# Patient Record
Sex: Male | Born: 1945 | Race: White | Hispanic: No | Smoking: Never smoker
Health system: Southern US, Community
[De-identification: ages and names within clinical notes are randomized; demographics above are authoritative.]

## PROBLEM LIST (undated history)

## (undated) DIAGNOSIS — N179 Acute kidney failure, unspecified: Secondary | ICD-10-CM

## (undated) DIAGNOSIS — G20 Parkinson's disease: Secondary | ICD-10-CM

## (undated) DIAGNOSIS — C61 Malignant neoplasm of prostate: Secondary | ICD-10-CM

## (undated) DIAGNOSIS — R1310 Dysphagia, unspecified: Secondary | ICD-10-CM

## (undated) DIAGNOSIS — G20A1 Parkinson's disease without dyskinesia, without mention of fluctuations: Secondary | ICD-10-CM

## (undated) DIAGNOSIS — C801 Malignant (primary) neoplasm, unspecified: Secondary | ICD-10-CM

## (undated) DIAGNOSIS — M1990 Unspecified osteoarthritis, unspecified site: Secondary | ICD-10-CM

## (undated) DIAGNOSIS — M6282 Rhabdomyolysis: Secondary | ICD-10-CM

## (undated) DIAGNOSIS — J45909 Unspecified asthma, uncomplicated: Secondary | ICD-10-CM

## (undated) HISTORY — DX: Malignant neoplasm of prostate: C61

## (undated) HISTORY — DX: Unspecified asthma, uncomplicated: J45.909

## (undated) HISTORY — DX: Parkinson's disease without dyskinesia, without mention of fluctuations: G20.A1

## (undated) HISTORY — PX: CARDIAC CATHETERIZATION: SHX172

## (undated) HISTORY — DX: Unspecified osteoarthritis, unspecified site: M19.90

## (undated) HISTORY — DX: Malignant (primary) neoplasm, unspecified: C80.1

## (undated) HISTORY — DX: Parkinson's disease: G20

---

## 2008-02-07 DIAGNOSIS — Z1211 Encounter for screening for malignant neoplasm of colon: Secondary | ICD-10-CM | POA: Insufficient documentation

## 2008-02-09 DIAGNOSIS — K200 Eosinophilic esophagitis: Secondary | ICD-10-CM | POA: Insufficient documentation

## 2010-04-25 DIAGNOSIS — R972 Elevated prostate specific antigen [PSA]: Secondary | ICD-10-CM | POA: Insufficient documentation

## 2010-06-09 HISTORY — PX: KNEE ARTHROSCOPY: SHX127

## 2021-06-02 DIAGNOSIS — M899 Disorder of bone, unspecified: Secondary | ICD-10-CM | POA: Insufficient documentation

## 2021-06-02 DIAGNOSIS — T68XXXA Hypothermia, initial encounter: Secondary | ICD-10-CM | POA: Insufficient documentation

## 2021-06-02 DIAGNOSIS — N179 Acute kidney failure, unspecified: Secondary | ICD-10-CM | POA: Insufficient documentation

## 2021-06-02 DIAGNOSIS — M6282 Rhabdomyolysis: Secondary | ICD-10-CM | POA: Insufficient documentation

## 2021-06-13 DIAGNOSIS — E872 Acidosis, unspecified: Secondary | ICD-10-CM | POA: Insufficient documentation

## 2021-06-13 DIAGNOSIS — M545 Low back pain, unspecified: Secondary | ICD-10-CM | POA: Insufficient documentation

## 2021-06-13 DIAGNOSIS — N400 Enlarged prostate without lower urinary tract symptoms: Secondary | ICD-10-CM | POA: Insufficient documentation

## 2021-06-13 DIAGNOSIS — C61 Malignant neoplasm of prostate: Secondary | ICD-10-CM | POA: Insufficient documentation

## 2021-06-13 DIAGNOSIS — L8995 Pressure ulcer of unspecified site, unstageable: Secondary | ICD-10-CM | POA: Insufficient documentation

## 2021-06-13 DIAGNOSIS — Z9181 History of falling: Secondary | ICD-10-CM | POA: Insufficient documentation

## 2021-06-13 DIAGNOSIS — I21A1 Myocardial infarction type 2: Secondary | ICD-10-CM | POA: Insufficient documentation

## 2021-06-13 DIAGNOSIS — R531 Weakness: Secondary | ICD-10-CM | POA: Insufficient documentation

## 2021-06-18 DIAGNOSIS — G893 Neoplasm related pain (acute) (chronic): Secondary | ICD-10-CM | POA: Insufficient documentation

## 2021-06-18 DIAGNOSIS — R634 Abnormal weight loss: Secondary | ICD-10-CM | POA: Insufficient documentation

## 2021-06-18 DIAGNOSIS — R339 Retention of urine, unspecified: Secondary | ICD-10-CM | POA: Insufficient documentation

## 2021-06-18 DIAGNOSIS — R198 Other specified symptoms and signs involving the digestive system and abdomen: Secondary | ICD-10-CM | POA: Insufficient documentation

## 2021-07-23 DIAGNOSIS — D649 Anemia, unspecified: Secondary | ICD-10-CM | POA: Insufficient documentation

## 2021-07-31 ENCOUNTER — Encounter: Payer: Self-pay | Admitting: Family Medicine

## 2021-07-31 ENCOUNTER — Telehealth: Payer: Self-pay

## 2021-07-31 ENCOUNTER — Ambulatory Visit (INDEPENDENT_AMBULATORY_CARE_PROVIDER_SITE_OTHER): Payer: Medicare Other | Admitting: Family Medicine

## 2021-07-31 ENCOUNTER — Other Ambulatory Visit: Payer: Self-pay

## 2021-07-31 VITALS — BP 101/64 | HR 87 | Temp 98.1°F | Resp 16 | Wt 112.0 lb

## 2021-07-31 DIAGNOSIS — J45909 Unspecified asthma, uncomplicated: Secondary | ICD-10-CM

## 2021-07-31 DIAGNOSIS — M1990 Unspecified osteoarthritis, unspecified site: Secondary | ICD-10-CM | POA: Diagnosis not present

## 2021-07-31 DIAGNOSIS — G20 Parkinson's disease: Secondary | ICD-10-CM | POA: Diagnosis not present

## 2021-07-31 DIAGNOSIS — C61 Malignant neoplasm of prostate: Secondary | ICD-10-CM | POA: Diagnosis not present

## 2021-07-31 DIAGNOSIS — T68XXXA Hypothermia, initial encounter: Secondary | ICD-10-CM

## 2021-07-31 DIAGNOSIS — L89892 Pressure ulcer of other site, stage 2: Secondary | ICD-10-CM

## 2021-07-31 DIAGNOSIS — Z111 Encounter for screening for respiratory tuberculosis: Secondary | ICD-10-CM

## 2021-07-31 DIAGNOSIS — G20A1 Parkinson's disease without dyskinesia, without mention of fluctuations: Secondary | ICD-10-CM

## 2021-07-31 DIAGNOSIS — E860 Dehydration: Secondary | ICD-10-CM

## 2021-07-31 NOTE — Progress Notes (Signed)
New patient visit  I,Philip [name],acting as a scribe for Philip Durie, MD.,have documented all relevant documentation on the behalf of Philip Durie, MD,as directed by  Philip Durie, MD while in the presence of Philip Durie, MD.   Patient: Philip [name]   DOB: 13-Jul-1945   75 y.o. Male  MRN: [id number] Visit Date: 07/31/2021  Today's healthcare provider: Wilhemena Durie, MD   Chief Complaint  Patient presents with   Establish Care   Subjective    Philip [name] is a 76 y.o. male who presents today as a new patient to establish care.  Pt brought in by brother and sister in law.  He saw his family doctor in [place] every 4 to 5 years.  No medical history other than right knee surgery 1981 left knee surgery in 1986 and left ACL reconstruction 2012.  Worked in the [place] area for the past 42 years and retired.  Has never been married and has no children he is a non-smoker he drinks alcohol about once a week.  He is on no medications and has no known drug allergies. 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  He is moved here now because this is his only family and he is moving into [employer]. He is in a wheelchair due to weakness and has an indwelling Foley catheter.  He was followed by Dr. Tobias Alexander and [place] he has no significant complaints today. HPI    Past Medical History:  Diagnosis Date   Arthritis    Asthma    Cancer ([place])    Parkinson disease ([place])    History reviewed. No pertinent surgical history. No family status information on file.   History reviewed. No pertinent family history. Social History   Socioeconomic History   Marital status: Widowed    Spouse name: Not on file   Number of children: Not on file   Years of education: Not on file   Highest education level:  Not on file  Occupational History   Not on file  Tobacco Use   Smoking status: Never   Smokeless tobacco: Never  Vaping Use   Vaping Use: Never used  Substance and Sexual Activity   Alcohol use: Yes   Drug use: Never   Sexual activity: Not on file  Other Topics Concern   Not on file  Social History Narrative   Not on file   Social Determinants of Health   Financial Resource Strain: Not on file  Food Insecurity: Not on file  Transportation Needs: Not on file  Physical Activity: Not on file  Stress: Not on file  Social Connections: Not on file   Outpatient Medications Prior to Visit  Medication Sig   Degarelix Acetate (FIRMAGON [place]) Inject into the skin.   No facility-administered medications prior to visit.   Not on File   There is no immunization history on file for this patient.  Health Maintenance  Topic Date Due   COVID-19 Vaccine (1) Never done   Hepatitis C Screening  Never done   TETANUS/TDAP  Never done   COLONOSCOPY (Pts 45-3yrs Insurance coverage will need to be confirmed)  Never done   Zoster Vaccines- Shingrix (1 of 2) Never done   Pneumonia Vaccine 64+ Years old (1 - PCV)  Never done   INFLUENZA VACCINE  Never done   HPV VACCINES  Aged Out    Patient Care Team: Jerrol Banana., MD as PCP - General (Family Medicine)  Review of Systems  All other systems reviewed and are negative.     Objective    BP 101/64 (BP Location: Right Arm, Patient Position: Sitting, Cuff Size: Normal)    Pulse 87    Temp 98.1 F (36.7 C) (Temporal)    Resp 16    Wt 112 lb (50.8 kg)    SpO2 97%  Physical Exam Vitals reviewed.  Constitutional:      Comments: Cachectic gentleman in no acute distress sitting in a wheelchair  HENT:     Head: Normocephalic and atraumatic.     Right Ear: External ear normal.     Left Ear: External ear normal.  Eyes:     Conjunctiva/sclera: Conjunctivae normal.  Cardiovascular:     Rate and Rhythm: Normal rate and regular rhythm.      Heart sounds: Normal heart sounds.  Pulmonary:     Breath sounds: Normal breath sounds.  Abdominal:     Palpations: Abdomen is soft.  Skin:    General: Skin is warm and dry.     Comments: All stage II decubitus that is healing nicely on the right lateral wrist and 2 small stage I decubiti on right lateral knee  Neurological:     Mental Status: He is alert.     Comments: Blank facies with right hand tremor.  Has cogwheeling right arm greater than left arm.  He has a shuffling gait. I did  Not have him walk very far.    Depression Screen PHQ 2/9 Scores 07/31/2021  PHQ - 2 Score 2  PHQ- 9 Score 5   No results found for any visits on 07/31/21.  Assessment & Plan      1. Prostate cancer ([place]) Metastatic to bone - Degarelix Acetate (FIRMAGON [place]); Inject into the skin. - CBC w/Diff/Platelet - Comprehensive Metabolic Panel (CMET) - TSH - Ambulatory referral to Urology - Ambulatory referral to Hematology / Oncology - Ambulatory referral to Physical Therapy  2. Parkinson disease [facility] For neurology. - Degarelix Acetate (FIRMAGON [place]); Inject into the skin. - CBC w/Diff/Platelet - Comprehensive Metabolic Panel (CMET) - TSH - Ambulatory referral to Neurology - Ambulatory referral to Physical Therapy  3. Arthritis  - Degarelix Acetate (FIRMAGON [place]); Inject into the skin. - CBC w/Diff/Platelet - Comprehensive Metabolic Panel (CMET) - TSH - Ambulatory referral to Physical Therapy  4. Uncomplicated asthma, unspecified asthma severity, unspecified whether persistent  - Degarelix Acetate (FIRMAGON [place]); Inject into the skin. - CBC w/Diff/Platelet - Comprehensive Metabolic Panel (CMET) - TSH - Ambulatory referral to Physical Therapy  5. Dehydration  - Degarelix Acetate (FIRMAGON [place]); Inject into the skin. - CBC w/Diff/Platelet - Comprehensive Metabolic Panel (CMET) - TSH  6. Hypothermia, initial encounter  - Degarelix Acetate (FIRMAGON [place]); Inject into the skin. -  CBC w/Diff/Platelet - Comprehensive Metabolic Panel (CMET) - TSH  7. Screening for tuberculosis  - QuantiFERON-TB Gold Plus 8.Decubiti  Return in about 2 months (around 09/28/2021).     I, Philip Durie, MD, have reviewed all documentation for this visit. The documentation on 08/04/21 for the exam, diagnosis, procedures, and orders are all accurate and complete.    Takeem Krotzer Cranford Mon, MD  [facility] [phone] (phone) [phone] (fax)  [place]

## 2021-07-31 NOTE — Telephone Encounter (Signed)
thx

## 2021-07-31 NOTE — Telephone Encounter (Signed)
Copied from [place] [id number]. Topic: General - Other >> Jul 30, 2021  5:09 PM Antonieta Iba C wrote: Reason for CRM: Philip [name] with Philip [name] assistant living is calling in to assist. She says that this person is a personal friend of Dr. Rosanna Randy and is being assisted by him. Philip [name] just moved here from [place]. Philip [name] says that provider agreed to assist Philip [name] with home health orders. Philip [name] would like a call back to discuss further.    [place]

## 2021-08-03 LAB — COMPREHENSIVE METABOLIC PANEL
ALT: 49 IU/L — ABNORMAL HIGH (ref 0–44)
AST: 29 IU/L (ref 0–40)
Albumin/Globulin Ratio: 2 (ref 1.2–2.2)
Albumin: 4.3 g/dL (ref 3.7–4.7)
Alkaline Phosphatase: 152 IU/L — ABNORMAL HIGH (ref 44–121)
BUN/Creatinine Ratio: 25 — ABNORMAL HIGH (ref 10–24)
BUN: 25 mg/dL (ref 8–27)
Bilirubin Total: 0.2 mg/dL (ref 0.0–1.2)
CO2: 25 mmol/L (ref 20–29)
Calcium: 9.1 mg/dL (ref 8.6–10.2)
Chloride: 101 mmol/L (ref 96–106)
Creatinine, Ser: 1.01 mg/dL (ref 0.76–1.27)
Globulin, Total: 2.2 g/dL (ref 1.5–4.5)
Glucose: 114 mg/dL — ABNORMAL HIGH (ref 70–99)
Potassium: 4.4 mmol/L (ref 3.5–5.2)
Sodium: 140 mmol/L (ref 134–144)
Total Protein: 6.5 g/dL (ref 6.0–8.5)
eGFR: 79 mL/min/{1.73_m2} (ref >59)

## 2021-08-03 LAB — CBC WITH DIFFERENTIAL/PLATELET
Basophils Absolute: 0.1 10*3/uL (ref 0.0–0.2)
Basos: 1 %
EOS (ABSOLUTE): 0.4 10*3/uL (ref 0.0–0.4)
Eos: 4 %
Hematocrit: 37.2 % — ABNORMAL LOW (ref 37.5–51.0)
Hemoglobin: 12.1 g/dL — ABNORMAL LOW (ref 13.0–17.7)
Immature Grans (Abs): 0 10*3/uL (ref 0.0–0.1)
Immature Granulocytes: 0 %
Lymphocytes Absolute: 1.5 10*3/uL (ref 0.7–3.1)
Lymphs: 18 %
MCH: 29 pg (ref 26.6–33.0)
MCHC: 32.5 g/dL (ref 31.5–35.7)
MCV: 89 fL (ref 79–97)
Monocytes Absolute: 0.8 10*3/uL (ref 0.1–0.9)
Monocytes: 9 %
Neutrophils Absolute: 5.8 10*3/uL (ref 1.4–7.0)
Neutrophils: 68 %
Platelets: 347 10*3/uL (ref 150–450)
RBC: 4.17 x10E6/uL (ref 4.14–5.80)
RDW: 14 % (ref 11.6–15.4)
WBC: 8.5 10*3/uL (ref 3.4–10.8)

## 2021-08-03 LAB — TSH: TSH: 1.34 u[IU]/mL (ref 0.450–4.500)

## 2021-08-03 LAB — QUANTIFERON-TB GOLD PLUS
QuantiFERON Mitogen Value: 0.27 IU/mL
QuantiFERON Nil Value: 0 IU/mL
QuantiFERON TB1 Ag Value: 0 IU/mL
QuantiFERON TB2 Ag Value: 0 IU/mL
QuantiFERON-TB Gold Plus: UNDETERMINED — AB

## 2021-08-06 NOTE — Progress Notes (Signed)
? ?08/07/21 ?4:02 PM  ? ?Philip [name] ?[birth year] ?[id number] ? ?Referring provider:  ?Philip [name]., MD ?[facility] ?[address] ?[place],  [place] [address] ?Chief Complaint  ?Patient presents with  ? Prostate Cancer  ? ? ? ?HPI: ?Philip [name] is a 76 y.o.male who presents today for further evaluation of prostate cancer and to establish care.  ? ?He was seen by his PCP, Dr. Rosanna [name], on 07/31/2021 to establish care. He was followed by a family doctor in [place] every 4 to 5 years. On 06/02/2021 he fell and was no found for a couple of days and was found to be dehydrated and hypothermic his work-up incidentally found him to have PSA of 962 and mri showed extensive sclerosis in pelvic, sacral, spine, and scapula, prostate cancer metastatic to bone. Creatinine was 2.94 without normalization. He had severe sepsis and bacteruria. He is in a wheelchair due to weakness and has an indwelling Foley catheter. He is living at [facility] to be closer to his brother  ? ?His most recent PSA on 07/31/2020 was 62.2.  ? ?He is accompanied by his brother today. He is able to walk with a walker. He has sensation in his bladder. He is unable to swallow pills. ? ?He has an appointment next week to establish care with Dr. Rogue [name]. ? ?He does believe that the last time he got a Depo injection, was for a longer period of time, likely transition to Eligard/Lupron.  Records from his oncologist were requested today. ? ?He continues to have a indwelling Foley catheter. ? ?He remains in a wheelchair and is quite weak.  In addition to the above, he also has a personal history of Parkinson's disease. ? ?PMH: ?Past Medical History:  ?Diagnosis Date  ? Arthritis   ? Asthma   ? Cancer [facility])   ? Parkinson disease ([place])   ? ? ?Surgical History: ?No past surgical history on file. ? ?Home Medications:  ?Allergies as of 08/07/2021   ?No Known Allergies ?  ? ?  ?Medication List  ?  ? ?  ? Accurate as of August 07, 2021  4:02 PM. If you have  any questions, ask your nurse or doctor.  ?  ?  ? ?  ? ?FIRMAGON [place] ?Inject into the skin. ?  ? ?  ? ? ?Allergies: No Known Allergies ? ?Family History: ?No family history on file. ? ?Social History:  reports that he has never smoked. He has never used smokeless tobacco. He reports current alcohol use. He reports that he does not use drugs. ? ? ?Physical Exam: ?BP 100/66   Pulse 87   ?Constitutional:  Alert and oriented, No acute distress. ?HEENT: [place] AT, moist mucus membranes.  Trachea midline, no masses. ?Cardiovascular: No clubbing, cyanosis, or edema. ?Respiratory: Normal respiratory effort, no increased work of breathing. ?Skin: No rashes, bruises or suspicious lesions. ?Neurologic: Grossly intact, no focal deficits, moving all 4 extremities. ?Psychiatric: Normal mood and affect. ? ?Laboratory Data: ?Lab Results  ?Component Value Date  ? CREATININE 1.01 07/31/2021  ? ?Pertinent imaging: ?Results for orders placed or performed in visit on 08/07/21  ?BLADDER SCAN AMB NON-IMAGING  ?Result Value Ref Range  ? Scan Result 76 ml   ? ? ? ?Assessment & Plan:   ?Metastatic prostate cancer ?- Records release from oncologist  ?- Defer treatment to Dr. Rogue [name]  in Oncology  ?- He was transitioned to Leuprorelin would likely benefit from addition from oral agent he has difficulty  swallowing.  ?-Records are requested today regarding timing of most recent leuprolide injection  ? ?2.  Urinary retention ?-Likely secondary to obstruction from prostate cancer as well as overall medical deconditioning ?-Given the fairly dramatic improvement in his PSA, he may benefit from a voiding trial, discussed risk and benefits of whether to proceed today.  Is very anxious about proceeding. ?- Indwelling catheter removed today, strict cautions to return later today and is having any difficulty voiding with low threshold to replace the catheter. ?- Will come back next week for a PVR. He agreed to voiding trial. We also discussed alpha  blockers  ? ? ?I,Philip [name],acting as a scribe for Philip Espy, MD.,have documented all relevant documentation on the behalf of Philip Espy, MD,as directed by  Philip Espy, MD while in the presence of Philip Espy, MD. ? ?I have reviewed the above documentation for accuracy and completeness, and I agree with the above.  ? ?Philip Espy, MD ? ? ?[facility] ?[address], [address] ?[place], [place] [address] ?[phone] ? ?

## 2021-08-07 ENCOUNTER — Other Ambulatory Visit: Payer: Self-pay

## 2021-08-07 ENCOUNTER — Ambulatory Visit: Payer: Medicare Other | Admitting: Urology

## 2021-08-07 ENCOUNTER — Ambulatory Visit (INDEPENDENT_AMBULATORY_CARE_PROVIDER_SITE_OTHER): Payer: Medicare Other | Admitting: Urology

## 2021-08-07 ENCOUNTER — Encounter: Payer: Self-pay | Admitting: Urology

## 2021-08-07 VITALS — BP 100/66 | HR 87

## 2021-08-07 DIAGNOSIS — R339 Retention of urine, unspecified: Secondary | ICD-10-CM | POA: Diagnosis not present

## 2021-08-07 DIAGNOSIS — C61 Malignant neoplasm of prostate: Secondary | ICD-10-CM

## 2021-08-07 DIAGNOSIS — C7951 Secondary malignant neoplasm of bone: Secondary | ICD-10-CM | POA: Diagnosis not present

## 2021-08-07 LAB — BLADDER SCAN AMB NON-IMAGING: Scan Result: 76

## 2021-08-07 MED ORDER — CEPHALEXIN 250 MG PO CAPS
500.0000 mg | ORAL_CAPSULE | Freq: Once | ORAL | Status: AC
  Administered 2021-08-07: 500 mg via ORAL

## 2021-08-07 MED ORDER — CEPHALEXIN 250 MG PO CAPS: 500.0000 mg | ORAL_CAPSULE | Freq: Every day | ORAL | Status: DC

## 2021-08-07 NOTE — Progress Notes (Signed)
Fill and Pull Catheter Removal ? ?Patient is present today for a catheter removal.  Patient was cleaned and prepped in a sterile fashion 219ml of sterile water/ saline was instilled into the bladder when the patient felt the urge to urinate. 68ml of water was then drained from the balloon.  A 16FR foley cath was removed from the bladder no complications were noted .  Patient as then given some time to void on their own.  Patient can void  26ml on their own after some time.  Patient tolerated well. ? ?Performed by: Verlene Mayer, CMA ? ?Follow up/ Additional notes: Return this afternoon if unable to void-reviewed precautions. Otherwise keep follow up as scheduled next week ? ?

## 2021-08-08 ENCOUNTER — Ambulatory Visit (INDEPENDENT_AMBULATORY_CARE_PROVIDER_SITE_OTHER): Payer: Medicare Other | Admitting: Urology

## 2021-08-08 DIAGNOSIS — R339 Retention of urine, unspecified: Secondary | ICD-10-CM | POA: Diagnosis not present

## 2021-08-08 LAB — BLADDER SCAN AMB NON-IMAGING: Scan Result: 350

## 2021-08-08 NOTE — Progress Notes (Signed)
Simple Catheter Placement ? ?Due to urinary retention patient is present today for a foley cath placement.  Patient was cleaned and prepped in a sterile fashion with betadine. A 16 FR  coude foley catheter was inserted, urine return was noted  375 ml, urine was yellow in color.  The balloon was filled with 10cc of sterile water.  A night bag was attached for drainage.   Patient was given instruction on proper catheter care.  Patient tolerated well, no complications were noted  ? ?Performed by: Kerman Passey, RMA ? ?Additional notes/ Follow up: 1 month voiding trial   ?

## 2021-08-08 NOTE — Patient Instructions (Signed)

## 2021-08-08 NOTE — Progress Notes (Signed)
? ?  08/09/21 ?7:58 AM  ? ?Philip [name] [name] ?[birth year] ?[id number] ? ?Referring provider:  ?Philip [name] [name]., MD ?[facility] ?[address] ?[place],  [place] [address] ?Chief Complaint  ?Patient presents with  ? Urinary Retention  ? ? ? ?HPI: ?Philip [name] [name] is a 76 y.o.male with a personal history of metastatic prostate cancer who presents today for urinary retention. ? ?Please see note from yesterday.  He underwent a voiding trial and was able to void some with very little residual yesterday afternoon.  Unfortunately, overnight, he was unable to urinate and was fairly uncomfortable.  He did dribble in his diaper. ? ?Bladder scan here in the office indicated about 350 cc. ? ? ?PMH: ?Past Medical History:  ?Diagnosis Date  ? Arthritis   ? Asthma   ? Cancer [facility])   ? Parkinson disease ([place])   ? ? ?Surgical History: ?No past surgical history on file. ? ?Home Medications:  ?Allergies as of 08/08/2021   ?No Known Allergies ?  ? ?  ?Medication List  ?  ? ?  ? Accurate as of August 08, 2021 11:59 PM. If you have any questions, ask your nurse or doctor.  ?  ?  ? ?  ? ?FIRMAGON [place] ?Inject into the skin. ?  ? ?  ? ? ?Allergies: No Known Allergies ? ?Family History: ?No family history on file. ? ?Social History:  reports that he has never smoked. He has never used smokeless tobacco. He reports current alcohol use. He reports that he does not use drugs. ? ? ?Physical Exam: ?Accompanied by brother today ?Constitutional:  Alert and oriented, No acute distress. ?HEENT: [place] AT, moist mucus membranes.  Trachea midline, no masses. ?Cardiovascular: No clubbing, cyanosis, or edema. ?Respiratory: Normal respiratory effort, no increased work of breathing. ?GU: Foley catheter replaced draining clear yellow urine, approximately 350 cc.  Lower abdomen nondistended. ?Skin: No rashes, bruises or suspicious lesions. ?Neurologic: Grossly intact, no focal deficits, moving all 4 extremities. ?Psychiatric: Normal mood and affect. ? ?Laboratory Data: ?Lab  Results  ?Component Value Date  ? CREATININE 1.01 07/31/2021  ? ? ?Pertinent Imaging: ?Results for orders placed or performed in visit on 08/08/21  ?BLADDER SCAN AMB NON-IMAGING  ?Result Value Ref Range  ? Scan Result 350 ml   ? ? ? ? ?Assessment & Plan:   ? ?Urinary retention ?-Failed voiding trial ?- His catheter was replaced today   ?-Continue to treat prostate cancer, reattempted voiding trial again in about a month ?-We did go ahead today and briefly have discussion about management options if he continues to remain in urinary retention including suprapubic tube, self cath, continue penile Foley catheter.  He is tremulous from his prostate cancer and CIC may not be an option.  He is somewhat interested in SP tube.  We will approach this at his next follow-up pending his success or failure with repeat void trial. ? ?2.  Metastatic prostate cancer ?-See note from yesterday, establishing care with medical oncology next week ? ?Return in 4 weeks for catheter removal  ? ?Conley Rolls as a scribe for [name], MD.,have documented all relevant documentation on the behalf of [name], MD,as directed by  [name], MD while in the presence of [name], MD. ? ?I have reviewed the above documentation for accuracy and completeness, and I agree with the above.  ? ?[name], MD ? ?[facility] ?[address], [address] ?[place], [place] [address] ?[phone] ? ?

## 2021-08-14 ENCOUNTER — Other Ambulatory Visit: Payer: Self-pay

## 2021-08-14 ENCOUNTER — Inpatient Hospital Stay: Payer: Medicare Other

## 2021-08-14 ENCOUNTER — Inpatient Hospital Stay: Payer: Medicare Other | Attending: Internal Medicine | Admitting: Internal Medicine

## 2021-08-14 ENCOUNTER — Ambulatory Visit: Payer: Medicare Other | Admitting: Urology

## 2021-08-14 ENCOUNTER — Telehealth: Payer: Self-pay

## 2021-08-14 ENCOUNTER — Encounter: Payer: Self-pay | Admitting: Internal Medicine

## 2021-08-14 DIAGNOSIS — M129 Arthropathy, unspecified: Secondary | ICD-10-CM | POA: Diagnosis not present

## 2021-08-14 DIAGNOSIS — C61 Malignant neoplasm of prostate: Secondary | ICD-10-CM | POA: Diagnosis present

## 2021-08-14 DIAGNOSIS — C7951 Secondary malignant neoplasm of bone: Secondary | ICD-10-CM | POA: Insufficient documentation

## 2021-08-14 DIAGNOSIS — G20 Parkinson's disease: Secondary | ICD-10-CM | POA: Diagnosis not present

## 2021-08-14 DIAGNOSIS — J45909 Unspecified asthma, uncomplicated: Secondary | ICD-10-CM | POA: Insufficient documentation

## 2021-08-14 DIAGNOSIS — R630 Anorexia: Secondary | ICD-10-CM | POA: Insufficient documentation

## 2021-08-14 NOTE — Telephone Encounter (Signed)
Called no answer no voicemail

## 2021-08-14 NOTE — Assessment & Plan Note (Addendum)
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 ?# I reviewed the natural history/stage of patient's prostate cancer in detail.  Discussed that unfortunately patient's cancer cannot be cured.  All treatments are palliative and not curative.  Discussed that patient will progress to castrate resistant prostate cancer on ADT alone 1 to 2 years.  ? ?I reviewed the new data from STAMPEDE/CHAARTED clinical trials-in patients with high-risk /castrate sensitive metastatic prostate cancer; that have shown the benefit of chemotherapy [Taxotere ] with approximately 1-2 years of PFS/also overall survival benefit. every 3 weeks ?6 cycles is recommended. I also reviewed the data of from LATITUDE study, given the use of abiraterone pills plus prednisone-showed to improve survival benefit [median survival-53 versus 36 months].  However patient has difficulty swallowing pills [see below].  Patient's performance status is poor at this time-I would recommend holding off chemotherapy. ? ?#Indwelling Foley catheter-s/p evaluation with Dr. Erlene Quan.  Failed trial of voiding [march 2023]- ?  Suprapubic catheter.  Awaiting appointment again in April ? ?# Dysphagia- [> 30 years-] ?  Esophageal spasms recommend GI evaluation- refer to GI  ? ?#Parkinson's-?-Await evaluation with neurology. ? ?#Again reviewed with the brother that-any additional therapies to ADT will depend upon patient's improvement of performance status-[above work-up with GI; neurology; urology] ? ?Thank you Dr. Rosanna Randy for allowing me to participate in the care of your pleasant patient. Please do not hesitate to contact me with questions or concerns in the interim.  The above plan of care was discussed with the patient and his brother in detail.  They are in agreement. ? ?# DISPOSITION: ?# no labs today ?# refer to Dr.Anna re:  Dysphagia/? Esophagus spasms ?# follow up in mid MAY 2023- MD; labs-  cbc/cmp;PSA- eligard- Dr.B ? ?

## 2021-08-14 NOTE — Progress Notes (Signed)
[place] OFFICE PROGRESS NOTE  Patient Care Team: Jerrol Banana., MD as PCP - General [facility])   Cancer Staging  No matching staging information was found for the patient.   Oncology History Overview Note  # DEC 2022 [X-mas- found s/p fall; [place]; PSA 962; s/p Bx of [name][Dr.[name]]  #    Prostate cancer metastatic to bone ([place])  08/14/2021 Initial Diagnosis   Prostate cancer metastatic to bone ([place])      HISTORY OF PRESENT ILLNESS: Patient in a wheelchair.  Patient lives in assisted living; accompanied by his brother.  Philip [name] 76 y.o.  male pleasant patient above history of metastatic prostate cancer to the bone has been referred to [place] by  PCP to discuss further evaluation/treatment/to establish care.   On Christmas Day 2022 he fell and was no found for a couple of days and was found to be dehydrated and hypothermic.  Patient was admitted to the ICU in [place].  During hospitalization patient was found to have elevated PSA -  962 and mri showed extensive sclerosis in pelvic, sacral, spine, and scapula, prostate cancer metastatic to bone. Creatinine was 2.94 without normalization. He had severe sepsis and bacteruria.   At discharge patient was sent over to rehab.  Given the family in [place]/brother-patient moved to [place].  Prior to this episode patient was losing weight.  However patient had been very healthy prior to this episode.  He was living alone.  Patient has chronic difficulty swallowing since age of 35.?  Esophagus spasms.   He remains in a wheelchair and is quite weak.  Possible diagnosis of Parkinson's disease; awaiting neurology evaluation.  With regards to prostate cancer patient received Firmagon x1; followed by Eligard on 2/14.  Patient continues to have indwelling Foley catheter   Review of Systems  Constitutional:  Positive for malaise/fatigue and weight loss. Negative for chills, diaphoresis and  fever.  HENT:  Negative for nosebleeds and sore throat.   Eyes:  Negative for double vision.  Respiratory:  Negative for cough, hemoptysis, sputum production, shortness of breath and wheezing.   Cardiovascular:  Negative for chest pain, palpitations, orthopnea and leg swelling.  Gastrointestinal:  Negative for abdominal pain, blood in stool, constipation, diarrhea, heartburn, melena, nausea and vomiting.  Genitourinary:  Negative for dysuria, frequency and urgency.  Musculoskeletal:  Negative for back pain and joint pain.  Skin: Negative.  Negative for itching and rash.  Neurological:  Positive for weakness. Negative for dizziness, tingling, focal weakness and headaches.  Endo/Heme/Allergies:  Does not bruise/bleed easily.  Psychiatric/Behavioral:  Negative for depression. The patient is not nervous/anxious and does not have insomnia.      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  MEDICATIONS:  Current Outpatient Medications  Medication Sig Dispense Refill   Degarelix Acetate (FIRMAGON [place]) Inject into the skin.     No current facility-administered medications for this visit.    PHYSICAL EXAMINATION: ECOG PERFORMANCE STATUS: 2 - Symptomatic, <50% confined to bed  BP 99/63 (BP Location: Left Arm, Patient Position: Sitting, Cuff Size:  Normal)    Pulse 77    Temp 98.6 F (37 C) (Tympanic)    Ht '5\' 8"'$  (1.727 m)    Wt 114 lb 12.8 oz (52.1 kg)    SpO2 99%    BMI 17.46 kg/m   Filed Weights   08/14/21 1402  Weight: 114 lb 12.8 oz (52.1 kg)    Physical Exam Vitals and nursing note reviewed.  HENT:     Head: Normocephalic and atraumatic.     Mouth/Throat:     Pharynx: Oropharynx is clear.  Eyes:     Extraocular Movements: Extraocular movements intact.     Pupils: Pupils are equal, round, and reactive to light.  Cardiovascular:     Rate and Rhythm: Normal rate and regular rhythm.  Pulmonary:     Comments: Decreased breath sounds bilaterally.  Abdominal:     Palpations: Abdomen is soft.  Musculoskeletal:        General: Normal range of motion.     Cervical back: Normal range of motion.  Skin:    General: Skin is warm.  Neurological:     General: No focal deficit present.     Mental Status: He is alert and oriented to person, place, and time.  Psychiatric:        Behavior: Behavior normal.        Judgment: Judgment normal.    LABORATORY DATA:  I have reviewed the data as listed    Component Value Date/Time   NA 140 07/31/2021 1612   K 4.4 07/31/2021 1612   CL 101 07/31/2021 1612   CO2 25 07/31/2021 1612   GLUCOSE 114 (H) 07/31/2021 1612   BUN 25 07/31/2021 1612   CREATININE 1.01 07/31/2021 1612   CALCIUM 9.1 07/31/2021 1612   PROT 6.5 07/31/2021 1612   ALBUMIN 4.3 07/31/2021 1612   AST 29 07/31/2021 1612   ALT 49 (H) 07/31/2021 1612   ALKPHOS 152 (H) 07/31/2021 1612   BILITOT 0.2 07/31/2021 1612    No results found for: SPEP, UPEP  Lab Results  Component Value Date   WBC 8.5 07/31/2021   NEUTROABS 5.8 07/31/2021   HGB 12.1 (L) 07/31/2021   HCT 37.2 (L) 07/31/2021   MCV 89 07/31/2021   PLT 347 07/31/2021      Chemistry      Component Value Date/Time   NA 140 07/31/2021 1612   K 4.4 07/31/2021 1612   CL 101 07/31/2021 1612   CO2 25 07/31/2021 1612   BUN 25 07/31/2021 1612    CREATININE 1.01 07/31/2021 1612      Component Value Date/Time   CALCIUM 9.1 07/31/2021 1612   ALKPHOS 152 (H) 07/31/2021 1612   AST 29 07/31/2021 1612   ALT 49 (H) 07/31/2021 1612   BILITOT 0.2 07/31/2021 1612       RADIOGRAPHIC STUDIES: I have personally reviewed the radiological images as listed and agreed with the findings in the report. No results found.   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  Discussed that unfortunately patient's cancer cannot be cured.  All treatments are palliative and not curative.  Discussed that patient will progress to castrate resistant prostate cancer on ADT alone 1 to 2 years.   I reviewed the new data from STAMPEDE/CHAARTED clinical trials-in patients with high-risk /castrate sensitive metastatic prostate cancer; that have shown the benefit of chemotherapy [Taxotere ] with approximately 1-2 years of PFS/also overall survival benefit. every 3 weeks 6 cycles is recommended. I also reviewed the data of from LATITUDE study, given the use of abiraterone pills plus prednisone-showed to improve survival benefit [median survival-53 versus 36 months].  However patient has difficulty swallowing pills [see below].  Patient's performance status is poor at this time-I would recommend holding off chemotherapy.  #Indwelling Foley catheter-s/p evaluation with Dr. Erlene Quan.  Failed trial of voiding [march 2023]- ?  Suprapubic catheter.  Awaiting appointment again in April  # Dysphagia- [> 30 years-] ?  Esophageal spasms recommend GI evaluation- refer to GI   #Parkinson's-?-Await evaluation with neurology.  #Again reviewed with the brother that-any additional therapies to ADT will depend upon patient's improvement of performance status-[above  work-up with GI; neurology; urology]  Thank you Dr. Rosanna Randy for allowing me to participate in the care of your pleasant patient. Please do not hesitate to contact me with questions or concerns in the interim.  The above plan of care was discussed with the patient and his brother in detail.  They are in agreement.  # DISPOSITION: # no labs today # refer to Dr.Anna re:  Dysphagia/? Esophagus spasms # follow up in mid MAY 2023- MD; labs- cbc/cmp;PSA- eligard- Dr.B    Orders Placed This Encounter  Procedures   CBC with Differential/Platelet    Standing Status:   Future    Standing Expiration Date:   08/15/2022   Comprehensive metabolic panel    Standing Status:   Future    Standing Expiration Date:   08/15/2022   PSA    Standing Status:   Future    Standing Expiration Date:   08/15/2022   Ambulatory referral to Gastroenterology    Referral Priority:   Routine    Referral Type:   Consultation    Referral Reason:   Specialty Services Required    Referred to Provider:   Jonathon Bellows, MD    Number of Visits Requested:   1   All questions were answered. The patient knows to call the clinic with any problems, questions or concerns.      [name], MD 08/14/2021 4:24 PM

## 2021-08-15 ENCOUNTER — Telehealth: Payer: Self-pay

## 2021-08-15 NOTE — Telephone Encounter (Signed)
Scheduled for 11/18/2021 ?

## 2021-09-10 NOTE — Progress Notes (Signed)
? ?09/11/21 ?11:43 AM  ? ?Philip [name] ?08-21-1945 ?[id number] ? ?Referring provider:  ?Jerrol Banana., MD ?[place] ?[address] ?[place],  [place] [address] ?No chief complaint on file. ? ? ? ?HPI: ?Philip [name] is a 76 y.o.male with a personal history of metastatic prostate cancer and urinary retention who presents today for a 1 month voiding trial.  ? ?In 07/2021 of this year he had a fall and was not found for a couple of days. He had extensive work-up and was found to have a PSA of 962, MRI showed extensive sclerosis in pelvic, sacral, spine, scapula, and prostate cancer metastatic to bone. His course was complicated by severe sepsis and bacteruria.  ? ?From a cancer perspective, he is being followed by Dr. Rogue Bussing.  He is on ADT.  His PSA is trending down, now 62.2. ? ?He underwent a voiding trial on 08/07/2021 he was unable to void and had some very little residual he returned on 08/08/2021 bladder scan was 350 cc his catheter was replaced.  ? ?He has been working with physical therapy and now ambulatory with a walker.  He feels stronger than previous. ? ?PMH: ?Past Medical History:  ?Diagnosis Date  ? Arthritis   ? Asthma   ? Cancer [facility])   ? Parkinson disease ([place])   ? Prostate cancer ([place])   ? ? ?Surgical History: ?Past Surgical History:  ?Procedure Laterality Date  ? KNEE ARTHROSCOPY Right 2012  ? also lt knee unsure of date  ? ? ?Home Medications:  ?Allergies as of 09/11/2021   ?No Known Allergies ?  ? ?  ?Medication List  ?  ? ?  ? Accurate as of September 11, 2021 11:59 PM. If you have any questions, ask your nurse or doctor.  ?  ?  ? ?  ? ?FIRMAGON [place] ?Inject into the skin. ?  ? ?  ? ? ?Allergies: No Known Allergies ? ?Family History: ?Family History  ?Problem Relation Age of Onset  ? Arthritis/Rheumatoid Mother   ? Stroke Father   ? Hemangiomas Brother   ? ? ?Social History:  reports that he has never smoked. He has never used smokeless tobacco. He reports current alcohol use. He reports that he  does not use drugs. ? ? ?Physical Exam: ?BP 130/76   Pulse 76   Ht '5\' 8"'$  (1.727 m)   Wt 114 lb (51.7 kg)   BMI 17.33 kg/m?   ?Constitutional:  Alert and oriented, No acute distress.  Accompanied by his brother today. ?HEENT: [place] AT, moist mucus membranes.  Trachea midline, no masses. ?Cardiovascular: No clubbing, cyanosis, or edema. ?Respiratory: Normal respiratory effort, no increased work of breathing. ?Skin: No rashes, bruises or suspicious lesions. ?Neurologic: Grossly intact, no focal deficits, moving all 4 extremities. ?Psychiatric: Normal mood and affect. ? ?Laboratory Data: ?Lab Results  ?Component Value Date  ? CREATININE 1.01 07/31/2021  ? ? ?Assessment & Plan:   ? ?Urinary retention  ?- He underwent voiding trial today that was successful  ?- Catheter was removed today with strict cautions to return later today and is having any difficulty voiding with low threshold to replace the catheter.  ?- Discussed with him that he should stay hydrated throughout the day so that he can urinate.  ?- He will return in 1 week for PVR and IPSS ?-If he ultimately fails his voiding trial, we will have him return next week to discuss alternative options including SP tube versus consideration of outlet procedure.  May consider cystoscopy at that time as well. ? ?2. Metastatic prostate cancer  ?- Treated by Dr Rogue Bussing in Oncology  ?- Managed on ADT alone  ? ? ? Return in 1 week for IPSS and PVR  ? ?Conley Rolls as a Education administrator for [name], MD.,have documented all relevant documentation on the behalf of [name], MD,as directed by  [name], MD while in the presence of [name], MD. ? ?I have reviewed the above documentation for accuracy and completeness, and I agree with the above.  ? ?[name], MD ? ? ?[facility] ?[address] ?[place], [place] [address] ?[phone]  ?

## 2021-09-11 ENCOUNTER — Ambulatory Visit (INDEPENDENT_AMBULATORY_CARE_PROVIDER_SITE_OTHER): Payer: Medicare Other | Admitting: Urology

## 2021-09-11 ENCOUNTER — Encounter: Payer: Self-pay | Admitting: Urology

## 2021-09-11 VITALS — BP 130/76 | HR 76 | Ht 68.0 in | Wt 114.0 lb

## 2021-09-11 DIAGNOSIS — R339 Retention of urine, unspecified: Secondary | ICD-10-CM | POA: Diagnosis not present

## 2021-09-11 DIAGNOSIS — C61 Malignant neoplasm of prostate: Secondary | ICD-10-CM | POA: Diagnosis not present

## 2021-09-11 DIAGNOSIS — C7951 Secondary malignant neoplasm of bone: Secondary | ICD-10-CM | POA: Diagnosis not present

## 2021-09-11 NOTE — Patient Instructions (Signed)
Call the office if unable to void after 6hours ? ? ?

## 2021-09-11 NOTE — Progress Notes (Signed)
Fill and Pull Catheter Removal ? ?Patient is present today for a catheter removal.  Patient was cleaned and prepped in a sterile fashion 126m of sterile water/ saline was instilled into the bladder when the patient felt the urge to urinate. 962mof water was then drained from the balloon.  A 16FR foley cath was removed from the bladder no complications were noted .  Patient as then given some time to void on their own.  Patient can void  11021mn their own after some time.  Patient tolerated well. ? ?Performed by: RebVerlene MayerMA ? ?Follow up/ Additional notes: Next week as scheduled, patient aware to return to clinic if unable to void after 6hrs.  ? ?

## 2021-09-12 ENCOUNTER — Encounter: Payer: Self-pay | Admitting: Urology

## 2021-09-12 ENCOUNTER — Ambulatory Visit (INDEPENDENT_AMBULATORY_CARE_PROVIDER_SITE_OTHER): Payer: Medicare Other | Admitting: Urology

## 2021-09-12 DIAGNOSIS — R339 Retention of urine, unspecified: Secondary | ICD-10-CM | POA: Diagnosis not present

## 2021-09-12 LAB — URINALYSIS, COMPLETE
Bilirubin, UA: NEGATIVE
Glucose, UA: NEGATIVE
Ketones, UA: NEGATIVE
Leukocytes,UA: NEGATIVE
Nitrite, UA: NEGATIVE
Protein,UA: NEGATIVE
Specific Gravity, UA: 1.02 (ref 1.005–1.030)
Urobilinogen, Ur: 0.2 mg/dL (ref 0.2–1.0)
pH, UA: 6 (ref 5.0–7.5)

## 2021-09-12 LAB — MICROSCOPIC EXAMINATION

## 2021-09-12 LAB — BLADDER SCAN AMB NON-IMAGING

## 2021-09-12 NOTE — Progress Notes (Signed)
09/12/21 ?4:58 PM  ? ?Philip [name] ?[birth year] ?[id number] ? ?Referring provider:  ?Jerrol Banana., MD ?[place] ?[address] ?[place],  [place] [address] ? ?Chief Complaint  ?Patient presents with  ? Urinary Retention  ? ?Urological history  ?Urinary retention  ?-Likely due to obstruction from prostate cancer as well as overall medical deconditioning ?- Voiding trial on 08/07/2021 he was unable to void and had some very little residual he returned on 08/08/2021 bladder scan was 350 cc his catheter was replaced ?- Returned for voiding trial with Dr Erlene Quan on 09/11/2021 had a successful fill and pull ?- PVR 344 mL.  ? ?2. Metastatic prostate cancer  ?- He had extensive work-up and was found to have a PSA of 962, MRI showed extensive sclerosis in pelvic, sacral, spine, scapula, and prostate cancer metastatic to bone. ?- Treated by Dr Rogue Bussing in Oncology  ?- Managed on ADT alone -pending neurological evaluation on April 27, additional treatment for his prostate cancer may be considered ? ?HPI: ?Philip [name] is a 76 y.o.male who presents today for further evaluation of urinary retention. He is accompanied by his brother, Jeneen Rinks.   ? ?He had a successful fill and pull yesterday, but around 11:00 last evening he started feeling bladder pain with an inability to urinate. ? ?Patient denies any modifying or aggravating factors.  Patient denies any gross hematuria, dysuria or flank pain.  Patient denies any fevers, chills, nausea or vomiting.   ? ?CATH UA 11-30 RBC's and few bacteria.  ? ?Catheter placed for return to 400 mL of urine. ? ?PMH: ?Past Medical History:  ?Diagnosis Date  ? Arthritis   ? Asthma   ? Cancer [facility])   ? Parkinson disease ([place])   ? Prostate cancer ([place])   ? ? ?Surgical History: ?Past Surgical History:  ?Procedure Laterality Date  ? KNEE ARTHROSCOPY Right 2012  ? also lt knee unsure of date  ? ? ?Home Medications:  ?Allergies as of 09/12/2021   ?No Known Allergies ?  ? ?  ?Medication List  ?  ? ?   ? Accurate as of September 12, 2021  4:58 PM. If you have any questions, ask your nurse or doctor.  ?  ?  ? ?  ? ?FIRMAGON [place] ?Inject into the skin. ?  ? ?  ? ? ?Allergies:  ?No Known Allergies ? ?Family History: ?Family History  ?Problem Relation Age of Onset  ? Arthritis/Rheumatoid Mother   ? Stroke Father   ? Hemangiomas Brother   ? ? ?Social History:  reports that he has never smoked. He has never used smokeless tobacco. He reports current alcohol use. He reports that he does not use drugs. ? ? ?Physical Exam: ?Constitutional:  Alert and oriented, No acute distress.  Elderly and frail-appearing ?HEENT: [place] AT, moist mucus membranes.  Trachea midline ?Cardiovascular: No clubbing, cyanosis, or edema. ?Respiratory: Normal respiratory effort, no increased work of breathing. ?Neurologic: Grossly intact, no focal deficits, moving all 4 extremities. ?Psychiatric: Normal mood and affect. ? ?Laboratory Data: ?Pertinent Imaging/Urinalysis ?Results for orders placed or performed in visit on 09/12/21  ?Microscopic Examination  ? Urine  ?Result Value Ref Range  ? WBC, UA 0-5 0 - 5 /hpf  ? RBC 11-30 (A) 0 - 2 /hpf  ? Epithelial Cells (non renal) 0-10 0 - 10 /hpf  ? Casts Present (A) None seen /lpf  ? Cast Type Granular casts (A) N/A  ? Bacteria, UA Few (A) None seen/Few  ?Urinalysis, Complete  ?  Result Value Ref Range  ? Specific Gravity, UA 1.020 1.005 - 1.030  ? pH, UA 6.0 5.0 - 7.5  ? Color, UA Yellow Yellow  ? Appearance Ur Clear Clear  ? Leukocytes,UA Negative Negative  ? Protein,UA Negative Negative/Trace  ? Glucose, UA Negative Negative  ? Ketones, UA Negative Negative  ? RBC, UA 1+ (A) Negative  ? Bilirubin, UA Negative Negative  ? Urobilinogen, Ur 0.2 0.2 - 1.0 mg/dL  ? Nitrite, UA Negative Negative  ? Microscopic Examination See below:   ?Bladder Scan (Post Void Residual) in office  ?Result Value Ref Range  ? Scan Result 352m   ?I have reviewed the labs.  ? ?Assessment & Plan:   ? ?Urinary retention  ?-PVR today 3471m  ?-His catheter was replaced today and he will return for visit with Dr BrErlene Quanext Tuesday for cystoscopy ?-We discussed today placement of a suprapubic tube and they are fairly interested in this ?-He would like to discuss this further with Dr BrErlene Quan-UA micro heme likely secondary to catheter trauma and cysto pending ?-urine sent for culture as he will undergo instrumentation  ? ?Return for Keep appoint with Dr. BrErlene Quann Tuesday. ? ?[place] ?[place] [phone] ?I,Annica Marinello,acting as a scribe for SHFederal-MogulPA-C.,have documented all relevant documentation on the behalf of SH[place]PA-C,as directed by  SHPlatte Health CenterPA-C while in the presence of SHMerrimanPA-C. ? ?I have reviewed the above documentation for accuracy and completeness, and I agree with the above.   ? ?ShZara CouncilPA-C  ?

## 2021-09-12 NOTE — Progress Notes (Signed)
Simple Catheter Placement ? ?Due to urinary retention patient is present today for a foley cath placement.  Patient was cleaned and prepped in a sterile fashion with betadine. A 16 FR foley catheter was inserted, urine return was noted  400 ml, urine was yellow in color.  The balloon was filled with 10cc of sterile water.  A night bag was attached for drainage. Patient was also given a night bag to take home and was given instruction on how to change from one bag to another.  Patient was given instruction on proper catheter care.  Patient tolerated well, no complications were noted  ? ?Performed by: Lesli Albee ? ?Additional notes/ Follow up: 09/17/2021  ?

## 2021-09-13 ENCOUNTER — Emergency Department
Admission: EM | Admit: 2021-09-13 | Discharge: 2021-09-13 | Disposition: A | Discharge status: Home / Self Care | Payer: Medicare Other | Attending: Student in an Organized Health Care Education/Training Program | Admitting: Student in an Organized Health Care Education/Training Program

## 2021-09-13 ENCOUNTER — Encounter: Payer: Self-pay | Admitting: Emergency Medicine

## 2021-09-13 ENCOUNTER — Other Ambulatory Visit: Payer: Self-pay

## 2021-09-13 DIAGNOSIS — R300 Dysuria: Secondary | ICD-10-CM | POA: Diagnosis not present

## 2021-09-13 DIAGNOSIS — Z8546 Personal history of malignant neoplasm of prostate: Secondary | ICD-10-CM | POA: Diagnosis not present

## 2021-09-13 DIAGNOSIS — R339 Retention of urine, unspecified: Secondary | ICD-10-CM | POA: Insufficient documentation

## 2021-09-13 DIAGNOSIS — R319 Hematuria, unspecified: Secondary | ICD-10-CM | POA: Diagnosis present

## 2021-09-13 LAB — CBC
HCT: 36.9 % — ABNORMAL LOW (ref 39.0–52.0)
Hemoglobin: 12.1 g/dL — ABNORMAL LOW (ref 13.0–17.0)
MCH: 29.7 pg (ref 26.0–34.0)
MCHC: 32.8 g/dL (ref 30.0–36.0)
MCV: 90.4 fL (ref 80.0–100.0)
Platelets: 316 10*3/uL (ref 150–400)
RBC: 4.08 MIL/uL — ABNORMAL LOW (ref 4.22–5.81)
RDW: 15 % (ref 11.5–15.5)
WBC: 7.8 10*3/uL (ref 4.0–10.5)
nRBC: 0 % (ref 0.0–0.2)

## 2021-09-13 LAB — URINALYSIS, ROUTINE W REFLEX MICROSCOPIC
Bilirubin Urine: NEGATIVE
Glucose, UA: NEGATIVE mg/dL
Ketones, ur: NEGATIVE mg/dL
Leukocytes,Ua: NEGATIVE
Nitrite: NEGATIVE
Protein, ur: 100 mg/dL — AB
RBC / HPF: >50 RBC/hpf — ABNORMAL HIGH (ref 0–5)
Specific Gravity, Urine: 1.019 (ref 1.005–1.030)
Squamous Epithelial / HPF: NONE SEEN (ref 0–5)
pH: 6 (ref 5.0–8.0)

## 2021-09-13 LAB — BASIC METABOLIC PANEL
Anion gap: 10 (ref 5–15)
BUN: 23 mg/dL (ref 8–23)
CO2: 29 mmol/L (ref 22–32)
Calcium: 9 mg/dL (ref 8.9–10.3)
Chloride: 103 mmol/L (ref 98–111)
Creatinine, Ser: 1.03 mg/dL (ref 0.61–1.24)
GFR, Estimated: >60 mL/min (ref >60)
Glucose, Bld: 94 mg/dL (ref 70–99)
Potassium: 3.9 mmol/L (ref 3.5–5.1)
Sodium: 142 mmol/L (ref 135–145)

## 2021-09-13 MED ORDER — CEPHALEXIN 250 MG/5ML PO SUSR
500.0000 mg | Freq: Two times a day (BID) | ORAL | 0 refills | Status: AC
Start: 2021-09-13 — End: 2021-09-20

## 2021-09-13 MED ORDER — CEPHALEXIN 125 MG/5ML PO SUSR
500.0000 mg | Freq: Once | ORAL | Status: AC
  Administered 2021-09-13: 500 mg via ORAL
  Filled 2021-09-13: qty 20

## 2021-09-13 MED ORDER — CEPHALEXIN 250 MG/5ML PO SUSR: 500.0000 mg | Freq: Two times a day (BID) | ORAL | 0 refills | Status: DC

## 2021-09-13 NOTE — ED Provider Notes (Signed)
? ?[facility] ?Provider Note ? ? ? Event Date/Time  ? First MD Initiated Contact with Patient 09/13/21 1750   ?  (approximate) ? ? ?History  ? ?Hematuria ? ? ?HPI ? ?Philip [name] is a 76 y.o. male presents to the ER due to concern for blood-tinged urine and some dysuria since having Foley catheter placed yesterday for recurrent urinary retention.  Does have a history of prostate cancer is not on any blood thinners.  Has been able to empty his bladder.  His Foley is continuing to drain.  Denies any fevers no nausea vomiting.  He is currently pain-free he has not been passing any clots. ?  ? ? ?Physical Exam  ? ?Triage Vital Signs: ?ED Triage Vitals  ?Enc Vitals Group  ?   BP 09/13/21 1636 110/64  ?   Pulse Rate 09/13/21 1636 69  ?   Resp 09/13/21 1636 16  ?   Temp 09/13/21 1636 98.5 ?F (36.9 ?C)  ?   Temp Source 09/13/21 1636 Oral  ?   SpO2 09/13/21 1636 98 %  ?   Weight 09/13/21 1545 113 lb 15.7 oz (51.7 kg)  ?   Height 09/13/21 1545 '5\' 8"'$  (1.727 m)  ?   Head Circumference --   ?   Peak Flow --   ?   Pain Score 09/13/21 1545 4  ?   Pain Loc --   ?   Pain Edu? --   ?   Excl. in [place]? --   ? ? ?Most recent vital signs: ?Vitals:  ? 09/13/21 1636  ?BP: 110/64  ?Pulse: 69  ?Resp: 16  ?Temp: 98.5 ?F (36.9 ?C)  ?SpO2: 98%  ? ? ? ?Constitutional: Alert  ?Eyes: Conjunctivae are normal.  ?Head: Atraumatic. ?Nose: No congestion/rhinnorhea. ?Mouth/Throat: Mucous membranes are moist.   ?Neck: Painless ROM.  ?Cardiovascular:   Good peripheral circulation. ?Respiratory: Normal respiratory effort.  No retractions.  ?Gastrointestinal: Soft and nontender.  Normal external genitalia.  No blood coming from urethra around the Foley.  Blood-tinged urine without clots in collection bag. ?Musculoskeletal:  no deformity ?Neurologic:  MAE spontaneously. No gross focal neurologic deficits are appreciated.  ?Skin:  Skin is warm, dry and intact. No rash noted. ?Psychiatric: Mood and affect are normal. Speech and behavior  are normal. ? ? ? ?ED Results / Procedures / Treatments  ? ?Labs ?(all labs ordered are listed, but only abnormal results are displayed) ?Labs Reviewed  ?URINALYSIS, ROUTINE W REFLEX MICROSCOPIC - Abnormal; Notable for the following components:  ?    Result Value  ? Color, Urine AMBER (*)   ? APPearance CLOUDY (*)   ? Hgb urine dipstick LARGE (*)   ? Protein, ur 100 (*)   ? RBC / HPF >50 (*)   ? Bacteria, UA RARE (*)   ? All other components within normal limits  ?CBC - Abnormal; Notable for the following components:  ? RBC 4.08 (*)   ? Hemoglobin 12.1 (*)   ? HCT 36.9 (*)   ? All other components within normal limits  ?BASIC METABOLIC PANEL  ? ? ? ?EKG ? ? ? ? ?RADIOLOGY ? ? ? ?PROCEDURES: ? ?Critical Care performed:  ? ?Procedures ? ? ?MEDICATIONS ORDERED IN ED: ?Medications  ?cephALEXin (KEFLEX) 125 MG/5ML suspension 500 mg (has no administration in time range)  ? ? ? ?IMPRESSION / MDM / ASSESSMENT AND PLAN / ED COURSE  ?I reviewed the triage vital signs and the nursing notes. ?             ?               ? ?  Differential diagnosis includes, but is not limited to, retention, prostatitis, cystitis, malignancy, AKI ? ?Patient presented to the ER for evaluation of blood tinged urine in his Foley catheter bag.  Clinically well-appearing afebrile hemodynamically stable hemoglobin at baseline no leukocytosis.  Does have hematuria with rare bacteria given recent Foley catheter insertion we will send urine for culture we will cover for antibiotics due to concern for cystitis.  Bladder scan with no sign of urinary retention.  Foley is functioning properly therefore do not believe that exchange clinically indicated.  Do believe patient is stable and appropriate for outpatient follow-up. ? ? ?  ? ? ?FINAL CLINICAL IMPRESSION(S) / ED DIAGNOSES  ? ?Final diagnoses:  ?Hematuria, unspecified type  ? ? ? ?Rx / DC Orders  ? ?ED Discharge Orders   ? ?      Ordered  ?  cephALEXin (KEFLEX) 250 MG/5ML suspension  2 times daily        ? 09/13/21 1826  ? ?  ?  ? ?  ? ? ? ?Note:  This document was prepared using Dragon voice recognition software and may include unintentional dictation errors. ? ?  ?Merlyn Lot, MD ?09/13/21 1830 ? ?

## 2021-09-13 NOTE — ED Notes (Signed)
Bladder scanned pt. Pt has 0-58m in his bladder upon multiple scans. MD notified ?

## 2021-09-13 NOTE — ED Triage Notes (Addendum)
BIB [facility].  From [facility].  Foley was placed Thursday.  Had one prior, d/c last Tuesday.  This foley placed last Thursday and today blood noted in urine. Patient c/o lower abdominal pain.  Hx Stage 4 prostate CA. ? ?VS wnl. ? ?Brother states foley has been removed and reinserted 4 times over the past two weeks.  Current foley placed yesterday. ?

## 2021-09-16 LAB — CULTURE, URINE COMPREHENSIVE

## 2021-09-16 NOTE — Progress Notes (Signed)
? ?09/17/21 ? ?HE:NIDPO ? ?HPI: ?Philip [name] is a 76 y.o. male with a personal history of metastatic prostate cancer and urinary retention who presents today for a diagnostic cystoscopy.  ? ?In 07/2021 of this year he had a fall and was not found for a couple of days. He had extensive work-up and was found to have a PSA of 962, MRI showed extensive sclerosis in pelvic, sacral, spine, scapula, and prostate cancer metastatic to bone. His course was complicated by severe sepsis and bacteruria.  ?  ?From a cancer perspective, he is being followed by Dr. Rogue Bussing.  He is on ADT.  ? ?He was seen in clinic on 09/12/2021 by Zara Council, PA-C, for further evaluation of urinary retention. His catheter was replaced PVR was 344 ml. He was interested in a SPT at this visit.  ? ?He was seen in th ED on 09/13/2021 with blood-tinged urine and some dysuria since having catheter in place.Urinalysis showed hematuria with rare bacteria, urine was sen for culture and he was treated with antibiotics due to concern for cystitis. Urine culture showed no growth.   ? ?He is accompanied by his brother.   A lot of questions today.  He is very concerned about the pain of going back into retention. ? ?Vitals:  ? 09/17/21 1003  ?BP: 104/66  ?Pulse: 89  ? ?NED. A&Ox3.   ?No respiratory distress   ?Abd soft, NT, ND ?Normal phallus with bilateral descended testicles ? ?Cystoscopy Procedure Note ? ?Patient identification was confirmed, informed consent was obtained, and patient was prepped using Betadine solution.  Lidocaine jelly was administered per urethral meatus.   ? ? ?Pre-Procedure: ?- Inspection reveals a normal caliber ureteral meatus. ? ?Procedure: ?The flexible cystoscope was introduced without difficulty ?- No urethral strictures/lesions are present. ?- Enlarged prostate significant bilobar coaptaiton about a 5 cm prostatic length ?- Normal bladder neck ?- Bilateral ureteral orifices identified ?- Bladder mucosa  reveals no  ulcers, tumors, or lesions ?- No bladder stones ?- Heavily trabeculated bladder with multiple saccules diffusely  ?- Mild catheter cystitis  ?-  Layering of calcium material but no frank bladder stones  ? ?Retroflexion shows heavily trabeculated bladder with significant bilobar coaptation in prostate. Layering of calcium was seen but there was no obvious bladder stones.  ? ? ?Post-Procedure: ?- Patient tolerated the procedure well ? ? ?Assessment/ Plan: ? ?Urinary retention  ?- likely multifactorial as below  ?-Foley catheter replaced today with 36 French for the time being ? ?2. BPH with outlet obstruction  ?- Based on anatomy likely chronic based on the changes in the bladder, heavy trabeculation with saccules ?- He has failed multiple voiding trial and concerned if he would benefit from procedure due to the end stage of his bladder  ?- He was offered a channel TURP versus an SP tube We discussed both in detail today. He is anxious today about SP tube due to him not wanting to have any pain associated with it.  ?- We discussed further evaluation with urodynamics to assess bladder function. He is most interested in urodynamics.  In light of changes in the bladder, this will help determine overall bladder Pacitti, sensation, degree of obstruction as well as overall contractility which is somewhat of a concerned based on the end-stage appearance of his bladder today and would likely help predict whether or not an outlet procedure would benefit him. ?- Referral sent to [facility] for Urodynamics; will follow-up with me for results.   ? ?  3. Metastatic prostate cancer  ?- Treated by Dr Rogue Bussing in Oncology  ?- Managed on ADT alone ? ?F/u UDS ? ?Conley Rolls as a Education administrator for [name], MD.,have documented all relevant documentation on the behalf of [name], MD,as directed by  [name], MD while in the presence of [name], MD. ? ?I have reviewed the above documentation for  accuracy and completeness, and I agree with the above.  ? ?[name], MD ? ?

## 2021-09-17 ENCOUNTER — Ambulatory Visit (INDEPENDENT_AMBULATORY_CARE_PROVIDER_SITE_OTHER): Payer: Medicare Other | Admitting: Urology

## 2021-09-17 ENCOUNTER — Encounter: Payer: Self-pay | Admitting: Urology

## 2021-09-17 VITALS — BP 104/66 | HR 89

## 2021-09-17 DIAGNOSIS — R339 Retention of urine, unspecified: Secondary | ICD-10-CM | POA: Diagnosis not present

## 2021-09-26 ENCOUNTER — Telehealth: Payer: Self-pay

## 2021-09-26 NOTE — Telephone Encounter (Signed)
Please advise 

## 2021-09-26 NOTE — Telephone Encounter (Signed)
Copied from [place] [id number]. Topic: General - Other ?>> Sep 26, 2021  9:55 AM Yvette Rack wrote: ?Reason for CRM: Monica Paschal with [facility] reports that patient complains of esophagus spasms. Brayton Layman stated patient complains that the spasms hurt. Monica also requests an order for speech therapy. Cb# [phone] ?

## 2021-10-02 ENCOUNTER — Ambulatory Visit: Payer: Self-pay | Admitting: *Deleted

## 2021-10-02 NOTE — Telephone Encounter (Signed)
?  Chief Complaint: drooling unable to eat and drink ?Symptoms: drooling unable to tolerate secretions, unable to swallow eat or drink since after breakfast.  ?Frequency: today  ?Pertinent Negatives: Patient denies chest pain , difficulty breathing, no fever, able to talk ?Disposition: '[x]'$ ED /'[]'$ Urgent Care (no appt availability in office) / '[]'$ Appointment(In office/virtual)/ '[]'$  [place] [facility]/ '[]'$ Home Care/ '[x]'$ Refused Recommended Disposition /'[]'$ [place] [facility]/ '[]'$  Follow-up with PCP ?Additional Notes:  ? ?Brayton Layman, Med Tech from [facility] calling to report patient refused to go to ED for evaluation for difficulty swallowing. Please advise  ?Recommended Med Tech to call supervisor with patient refusal.  ? ? Reason for Disposition ? SEVERE difficulty swallowing (e.g., drooling or spitting, can't swallow water) ? ?Answer Assessment - Initial Assessment Questions ?1. SYMPTOM: "Are you having difficulty swallowing liquids, solids, or both?" ?    Both  ?2. ONSET: "When did the swallowing problems begin?"  ?    After breakfast  ?3. CAUSE: "What do you think is causing the problem?"  ?    Not sure issues with esophagus  ?4. CHRONIC/RECURRENT: "Is this a new problem for you?"  If no, ask: "How long have you had this problem?" (e.g., days, weeks, months)  ?    No  ?5. OTHER SYMPTOMS: "Do you have any other symptoms?" (e.g., difficulty breathing, sore throat, swollen tongue, chest pain) ?    Drooling and unable to swallow ?6. PREGNANCY: "Is there any chance you are pregnant?" "When was your last menstrual period?" ?    na ? ?Protocols used: Swallowing Difficulty-A-AH ? ?

## 2021-10-03 ENCOUNTER — Encounter: Payer: Self-pay | Admitting: Physician Assistant

## 2021-10-03 ENCOUNTER — Ambulatory Visit: Payer: Self-pay

## 2021-10-03 ENCOUNTER — Ambulatory Visit (INDEPENDENT_AMBULATORY_CARE_PROVIDER_SITE_OTHER): Payer: Medicare Other | Admitting: Physician Assistant

## 2021-10-03 VITALS — BP 104/79 | HR 81 | Ht 68.0 in | Wt 118.8 lb

## 2021-10-03 DIAGNOSIS — C61 Malignant neoplasm of prostate: Secondary | ICD-10-CM | POA: Diagnosis not present

## 2021-10-03 DIAGNOSIS — R1310 Dysphagia, unspecified: Secondary | ICD-10-CM | POA: Insufficient documentation

## 2021-10-03 DIAGNOSIS — G20 Parkinson's disease: Secondary | ICD-10-CM | POA: Diagnosis not present

## 2021-10-03 DIAGNOSIS — C7951 Secondary malignant neoplasm of bone: Secondary | ICD-10-CM | POA: Diagnosis not present

## 2021-10-03 NOTE — Telephone Encounter (Signed)
?  Chief Complaint: Inability to swallow - throat spasm ?Symptoms: Inability to swallow even saliva at times ?Frequency: ongoing - worsening in the past 2-3 weeks ?Pertinent Negatives: Brother unsure if pt has any other more worrisome symptoms. Pt is in assisted living. Pt has not complained of other issues to brother. ?Disposition: '[]'$ ED /'[]'$ Urgent Care (no appt availability in office) / '[x]'$ Appointment(In office/virtual)/ '[]'$  [place] [facility]/ '[]'$ Home Care/ '[]'$ Refused Recommended Disposition /'[]'$ [place] [facility]/ '[]'$  Follow-up with PCP ?Additional Notes: Pt's brother, Jeneen Rinks, called. Brother is not with pt. Pt has had trouble swallowing all of his life. Over the past 2-3 weeks this problem has increased in frequency. Pt, at times, cannot swallow saliva, the condition then resolves until next incidence. Pt has had to skip a few meals because of this issue.  ?Reason for Disposition ? [1] Swallowing difficulty AND [2] cause unknown (Exception: difficulty swallowing is a chronic symptom) ? ?Answer Assessment - Initial Assessment Questions ?1. SYMPTOM: "Are you having difficulty swallowing liquids, solids, or both?" ?    both ?2. ONSET: "When did the swallowing problems begin?"  ?    2-3 weeks - increasing in frequency. ? ?3. CAUSE: "What do you think is causing the problem?"  ?    Unsure - may be having a throat spasm ?4. CHRONIC/RECURRENT: "Is this a new problem for you?"  If no, ask: "How long have you had this problem?" (e.g., days, weeks, months)  ?    All his life off and on getting worse in past 2-3 weeks.  ?5. OTHER SYMPTOMS: "Do you have any other symptoms?" (e.g., difficulty breathing, sore throat, swollen tongue, chest pain) ?    Unknown - Brother states that pt has not complained of any of these issues. And so does not think so. ?6. PREGNANCY: "Is there any chance you are pregnant?" "When was your last menstrual period?" ?    na ? ?Protocols used: Swallowing Difficulty-A-AH ? ?

## 2021-10-03 NOTE — Progress Notes (Signed)
?  ? ?I,Sha'taria Tyson,acting as a Education administrator for [employer], PA-C.,have documented all relevant documentation on the behalf of Philip Kirschner, PA-C,as directed by  Philip Kirschner, PA-C while in the presence of Philip Kirschner, PA-C. ? ? ?Established patient visit ? ? ?Patient: Philip [name]   DOB: 1945/11/02   76 y.o. Male  MRN: [id number] ?Visit Date: 10/03/2021 ? ?Today's healthcare provider: Mikey Kirschner, PA-C  ? ?Cc. dysphagia ? ?Subjective  ?  ?HPI  ?Philip [name] is a 76 y/o male with PMH of metastatic prostate cancer, new dx of Parkinson's Disease. Currently resides in assisted living. Reports long-standing history of dysphagia, and that it is acutely worsening. Reports spasms of throat where he is unable to swallow liquids, solids, or saliva. They can last for a few seconds to hours at a time. He saw a neurologist today who prescribed him a 'dopamine medication'. Denies any aspiration events, denies new cough, SOB.  ? ?Medications: ?Outpatient Medications Prior to Visit  ?Medication Sig  ? Degarelix Acetate (FIRMAGON [place]) Inject into the skin.  ? ?No facility-administered medications prior to visit.  ? ? ?Review of Systems  ?Constitutional:  Negative for fatigue and fever.  ?HENT:  Positive for trouble swallowing.   ?Respiratory:  Negative for cough and shortness of breath.   ?Cardiovascular:  Negative for chest pain, palpitations and leg swelling.  ?Neurological:  Negative for dizziness and headaches.  ? ? ?  Objective  ?  ?Blood pressure 104/79, pulse 81, height '5\' 8"'$  (1.727 m), weight 118 lb 12.8 oz (53.9 kg), SpO2 100 %.  ? ?Physical Exam ?Constitutional:   ?   General: He is awake.  ?   Appearance: He is well-developed.  ?HENT:  ?   Head: Normocephalic.  ?Eyes:  ?   Conjunctiva/sclera: Conjunctivae normal.  ?Cardiovascular:  ?   Rate and Rhythm: Normal rate and regular rhythm.  ?   Heart sounds: Normal heart sounds.  ?Pulmonary:  ?   Effort: Pulmonary effort is normal.  ?   Breath sounds: Normal breath sounds.   ?Skin: ?   General: Skin is warm.  ?Neurological:  ?   Mental Status: He is alert and oriented to person, place, and time.  ?Psychiatric:     ?   Attention and Perception: Attention normal.     ?   Mood and Affect: Mood normal.     ?   Speech: Speech normal.     ?   Behavior: Behavior is cooperative.  ?  ? ?No results found for any visits on 10/03/21. ? Assessment & Plan  ?  ? ?Problem List Items Addressed This Visit   ? ?  ? Digestive  ? Dysphagia - Primary  ?  Likely secondary to Parkinsons. ?Discussed safe swallowing w/ pt, pt amenable to setting up a home health SLP consultation and therapy.  ?Agree with neurologist's recommendations of starting parkinsons meds. ? ?  ?  ? Relevant Orders  ? Ambulatory referral to Home Health  ?  ? Musculoskeletal and Integument  ? Prostate cancer metastatic to bone [facility])  ? Relevant Orders  ? Ambulatory referral to Home Health  ? ?Other Visit Diagnoses   ? ? Parkinson disease ([place])      ? Relevant Orders  ? Ambulatory referral to Home Health  ? ?  ?  ? ?I, Philip Kirschner, PA-C have reviewed all documentation for this visit. The documentation on  10/03/2021 for the exam, diagnosis, procedures, and orders are all accurate and complete. ? ?  Philip Kirschner, PA-C ?[facility] ?[id number] ?[place], [place], [address] ?Office: [phone] ?Fax: [phone]  ? ?[facility] ? ?

## 2021-10-04 NOTE — Assessment & Plan Note (Signed)
Likely secondary to Parkinsons. ?Discussed safe swallowing w/ pt, pt amenable to setting up a home health SLP consultation and therapy.  ?Agree with neurologist's recommendations of starting parkinsons meds. ?

## 2021-10-09 ENCOUNTER — Ambulatory Visit: Payer: Medicare Other | Admitting: Neurology

## 2021-10-09 ENCOUNTER — Telehealth: Payer: Self-pay

## 2021-10-09 NOTE — Telephone Encounter (Signed)
Returned call from voicemail left on triage line in regards to UDS study in [place]. Spoke with patient's brother's wife [place] ok per DPR. She states patient does not wish to have UDS done. Per Dr. Erlene Quan patient will need to keep foley in chronically and will need to continue with monthly cath exchanges. [place] verbalized understanding and monthly exchange appointment was scheduled.  ?

## 2021-10-11 ENCOUNTER — Telehealth: Payer: Self-pay

## 2021-10-11 NOTE — Telephone Encounter (Signed)
Copied from [place]. Topic: General - Other ?>> Oct 10, 2021  4:45 PM Parke Poisson wrote: ?Reason for CRM: Santiago Glad from [facility] states pt is refusing speech therapy. Can someone reach out to him and explain the need before they close referral? ?

## 2021-10-17 ENCOUNTER — Ambulatory Visit: Payer: Medicare Other | Admitting: Physician Assistant

## 2021-10-17 ENCOUNTER — Ambulatory Visit (INDEPENDENT_AMBULATORY_CARE_PROVIDER_SITE_OTHER): Payer: Medicare Other | Admitting: Physician Assistant

## 2021-10-17 DIAGNOSIS — R339 Retention of urine, unspecified: Secondary | ICD-10-CM | POA: Diagnosis not present

## 2021-10-17 NOTE — Progress Notes (Signed)
Patient and brother presented to clinic today for schedule Foley catheter replacement after declining UDS. They wish to attempt another voiding trial today instead. ? ?I explained that I am not optimistic that he will be able to void on his own given his multiple failed voiding trials and end-stage bladder on cystoscopy. Patient still wishes to attempt this. Unfortunately I do not have afternoon availability today for a follow-up bladder scan if he goes back into retention, which he is at high risk for. ? ?I am scheduling him for a voiding trial tomorrow. As it will be Friday, we agreed to keep a low threshold for Foley replacement so that he can avoid going back to the ED for Foley placement over the weekend. ? ?Patient resides at [facility] and reports nursing services are available for Foley maintenance. Will place orders for Foley placement versus exchanges as indicated by his voiding trial tomorrow. ? ?Debroah Loop, PA-C ?10/17/21 ?9:32 AM ? ?I spent 15 minutes on the day of the encounter to include pre-visit record review, face-to-face time with the patient, and post-visit ordering of tests.  ?

## 2021-10-17 NOTE — Progress Notes (Signed)
Patient and his brother presented to clinic today for scheduled Foley catheter placement.  They declined urodynamics testing for further evaluation of his bladder function. ? ?Patient reports today that he wishes to reattempt a voiding trial at this time.  I explained that I am not optimistic that he will be able to void spontaneously given his multiple failed voiding trials and findings of end-stage appearing bladder on cystoscopy with Dr. Erlene Quan.  Regardless, patient wishes to reattempt this. ? ?Unfortunately, I do not have time of my schedule this afternoon for repeat PVR, which is necessary as I feel he is at incredibly high risk of recurrent urinary retention.  We agreed to schedule him for a voiding trial tomorrow instead.  Given that this will be a Friday, we agreed to keep a very low threshold for Foley catheter replacement to avoid him going into retention over the weekend and requiring an ED visit. ? ?Additionally, patient reports he lives at [facility] and they have nursing staff available for Foley catheter placement and maintenance as needed.  We will place orders for this per patient preference based on the outcome of his voiding trial tomorrow. ? ?Debroah Loop, PA-C ?10/17/21 ?9:35 AM ? ?I spent 15 minutes on the day of the encounter to include pre-visit record review, face-to-face time with the patient, and post-visit ordering of tests.  ?

## 2021-10-18 ENCOUNTER — Ambulatory Visit: Payer: Medicare Other | Admitting: Physician Assistant

## 2021-10-18 ENCOUNTER — Ambulatory Visit (INDEPENDENT_AMBULATORY_CARE_PROVIDER_SITE_OTHER): Payer: Medicare Other | Admitting: Physician Assistant

## 2021-10-18 DIAGNOSIS — R339 Retention of urine, unspecified: Secondary | ICD-10-CM | POA: Diagnosis not present

## 2021-10-18 NOTE — Progress Notes (Signed)
Cath Change/ Replacement ? ?Patient is present today for a catheter change due to urinary retention.  91m of water was removed from the balloon, a 16FR foley cath was removed without difficulty.  Patient was cleaned and prepped in a sterile fashion with betadine and 2% lidocaine jelly was instilled into the urethra. A 16 FR foley cath was replaced into the bladder no complications were noted Urine return was noted 354mand urine was yellow in color. The balloon was filled with 1019mf sterile water. A night bag was attached for drainage.  Patient tolerated well.   ? ?Performed by: SamDebroah LoopA-C ? ?Additional notes: Patient originally scheduled for voiding trial today but elected for Foley exchange instead. He gets his next cancer treatment on 5/17 and wishes to reattempt a voiding trial next month. Will schedule. ? ?Follow up: Return in about 4 weeks (around 11/15/2021) for Voiding trial.   ?

## 2021-10-22 ENCOUNTER — Encounter: Payer: Medicare Other | Admitting: Physician Assistant

## 2021-10-22 ENCOUNTER — Other Ambulatory Visit: Payer: Self-pay

## 2021-10-22 MED ORDER — CARBIDOPA-LEVODOPA 25-100 MG PO TABS: 1.0000 | ORAL_TABLET | Freq: Three times a day (TID) | ORAL | 3 refills | Status: DC

## 2021-10-23 ENCOUNTER — Telehealth: Payer: Self-pay | Admitting: Pharmacist

## 2021-10-23 ENCOUNTER — Inpatient Hospital Stay: Payer: Medicare Other

## 2021-10-23 ENCOUNTER — Other Ambulatory Visit (HOSPITAL_COMMUNITY): Payer: Self-pay

## 2021-10-23 ENCOUNTER — Ambulatory Visit (INDEPENDENT_AMBULATORY_CARE_PROVIDER_SITE_OTHER): Payer: Medicare Other | Admitting: Family Medicine

## 2021-10-23 ENCOUNTER — Inpatient Hospital Stay (HOSPITAL_BASED_OUTPATIENT_CLINIC_OR_DEPARTMENT_OTHER): Payer: Medicare Other | Admitting: Internal Medicine

## 2021-10-23 ENCOUNTER — Inpatient Hospital Stay: Payer: Medicare Other | Attending: Internal Medicine

## 2021-10-23 ENCOUNTER — Inpatient Hospital Stay: Payer: Medicare Other | Admitting: Pharmacist

## 2021-10-23 ENCOUNTER — Encounter: Payer: Self-pay | Admitting: Internal Medicine

## 2021-10-23 ENCOUNTER — Encounter: Payer: Self-pay | Admitting: Family Medicine

## 2021-10-23 VITALS — BP 104/58 | HR 66 | Resp 16 | Wt 121.0 lb

## 2021-10-23 DIAGNOSIS — K219 Gastro-esophageal reflux disease without esophagitis: Secondary | ICD-10-CM | POA: Diagnosis not present

## 2021-10-23 DIAGNOSIS — Z79818 Long term (current) use of other agents affecting estrogen receptors and estrogen levels: Secondary | ICD-10-CM | POA: Insufficient documentation

## 2021-10-23 DIAGNOSIS — G20 Parkinson's disease: Secondary | ICD-10-CM | POA: Insufficient documentation

## 2021-10-23 DIAGNOSIS — C7951 Secondary malignant neoplasm of bone: Secondary | ICD-10-CM

## 2021-10-23 DIAGNOSIS — R339 Retention of urine, unspecified: Secondary | ICD-10-CM

## 2021-10-23 DIAGNOSIS — L89892 Pressure ulcer of other site, stage 2: Secondary | ICD-10-CM

## 2021-10-23 DIAGNOSIS — R1310 Dysphagia, unspecified: Secondary | ICD-10-CM

## 2021-10-23 DIAGNOSIS — C61 Malignant neoplasm of prostate: Secondary | ICD-10-CM

## 2021-10-23 DIAGNOSIS — J45909 Unspecified asthma, uncomplicated: Secondary | ICD-10-CM | POA: Insufficient documentation

## 2021-10-23 DIAGNOSIS — E441 Mild protein-calorie malnutrition: Secondary | ICD-10-CM

## 2021-10-23 DIAGNOSIS — M129 Arthropathy, unspecified: Secondary | ICD-10-CM | POA: Insufficient documentation

## 2021-10-23 LAB — COMPREHENSIVE METABOLIC PANEL
ALT: 51 U/L — ABNORMAL HIGH (ref 0–44)
AST: 40 U/L (ref 15–41)
Albumin: 4 g/dL (ref 3.5–5.0)
Alkaline Phosphatase: 84 U/L (ref 38–126)
Anion gap: 6 (ref 5–15)
BUN: 20 mg/dL (ref 8–23)
CO2: 30 mmol/L (ref 22–32)
Calcium: 9 mg/dL (ref 8.9–10.3)
Chloride: 100 mmol/L (ref 98–111)
Creatinine, Ser: 1.07 mg/dL (ref 0.61–1.24)
GFR, Estimated: >60 mL/min (ref >60)
Glucose, Bld: 95 mg/dL (ref 70–99)
Potassium: 4 mmol/L (ref 3.5–5.1)
Sodium: 136 mmol/L (ref 135–145)
Total Bilirubin: 0.6 mg/dL (ref 0.3–1.2)
Total Protein: 7.2 g/dL (ref 6.5–8.1)

## 2021-10-23 LAB — CBC WITH DIFFERENTIAL/PLATELET
Abs Immature Granulocytes: 0.02 10*3/uL (ref 0.00–0.07)
Basophils Absolute: 0.1 10*3/uL (ref 0.0–0.1)
Basophils Relative: 1 %
Eosinophils Absolute: 0.2 10*3/uL (ref 0.0–0.5)
Eosinophils Relative: 2 %
HCT: 40.4 % (ref 39.0–52.0)
Hemoglobin: 13.4 g/dL (ref 13.0–17.0)
Immature Granulocytes: 0 %
Lymphocytes Relative: 18 %
Lymphs Abs: 1.5 10*3/uL (ref 0.7–4.0)
MCH: 29.6 pg (ref 26.0–34.0)
MCHC: 33.2 g/dL (ref 30.0–36.0)
MCV: 89.4 fL (ref 80.0–100.0)
Monocytes Absolute: 0.9 10*3/uL (ref 0.1–1.0)
Monocytes Relative: 11 %
Neutro Abs: 5.7 10*3/uL (ref 1.7–7.7)
Neutrophils Relative %: 68 %
Platelets: 301 10*3/uL (ref 150–400)
RBC: 4.52 MIL/uL (ref 4.22–5.81)
RDW: 13.6 % (ref 11.5–15.5)
WBC: 8.3 10*3/uL (ref 4.0–10.5)
nRBC: 0 % (ref 0.0–0.2)

## 2021-10-23 LAB — PSA: Prostatic Specific Antigen: 16.55 ng/mL — ABNORMAL HIGH (ref 0.00–4.00)

## 2021-10-23 MED ORDER — LEUPROLIDE ACETATE (6 MONTH) 45 MG ~~LOC~~ KIT
45.0000 mg | PACK | Freq: Once | SUBCUTANEOUS | Status: AC
  Administered 2021-10-23: 45 mg via SUBCUTANEOUS
  Filled 2021-10-23: qty 45

## 2021-10-23 MED ORDER — OMEPRAZOLE 20 MG PO CPDR: 20.0000 mg | DELAYED_RELEASE_CAPSULE | ORAL | 3 refills | Status: DC

## 2021-10-23 NOTE — Telephone Encounter (Signed)
Oral Chemotherapy Pharmacist Encounter  ? ?No PA required for [name] ?Copay: $8,099.83 ? ?Darl Pikes, PharmD, BCPS, BCOP, CPP ?Hematology/Oncology Clinical Pharmacist ?[place]/[facility] Chem[facility] ?[phone] ? ?10/23/2021 2:11 PM ? ?

## 2021-10-23 NOTE — Progress Notes (Signed)
Patient reports chronic (for the past 35 years) throat "spasms" that cause difficulty swallowing.  Has an appt with GI in 11/2021. ?

## 2021-10-23 NOTE — Progress Notes (Signed)
[place] ?OFFICE PROGRESS NOTE ? ?Patient Care Team: ?Jerrol Banana., MD as PCP - General (Family Medicine) ? ? Cancer Staging  ?No matching staging information was found for the patient. ? ? ?Oncology History Overview Note  ?# DEC 2022 [X-mas- found s/p fall; [place]; PSA 962; s/p Bx of [name][Dr.[name]]; s/p ADT in [place]; FEB 2023- 62 [Dr.[name]] ? ?# n Christmas Day 2022 he fell and was no found for a couple of days and was found to be dehydrated and hypothermic.  Patient was admitted to the ICU in [place].  During hospitalization patient was found to have elevated PSA -  962 and mri showed extensive sclerosis in pelvic, sacral, spine, and scapula, prostate cancer metastatic to bone. Creatinine was 2.94 without normalization. He had severe sepsis and bacteruria.  ? ?At discharge patient was sent over to rehab.  Given the family in [place]/brother-patient moved to [place]. ? ?Prior to this episode patient was losing weight.  However patient had been very healthy prior to this episode.  He was living alone. ? 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  Esophagus spasms. ?  ?Prostate cancer metastatic to bone [facility])  ?08/14/2021 Initial Diagnosis  ? Prostate cancer metastatic to bone [facility]) ?  ? ?  ?HISTORY OF PRESENT ILLNESS: Patient in a wheelchair.  Patient lives in assisted living; accompanied by his brother. ? ?Philip [name] 76 y.o.  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 ? ?Patient currently undergoing trial of voiding; unfortunately unsuccessful so far.  Awaiting repeat trial of voiding around 11/15/2021.Patient continues to have indwelling Foley catheter ? ?Patient continues to be in [facility].  Otherwise has not had any hospitalizations.  Denies any worsening joint pains or bone pain.  Denies any hot flashes. ? ?Patient has continues to have chronic difficulty swallowing  since age of 52.?  Esophagus spasms.  Awaiting GI evaluation in June. ?  ?He remains in a wheelchair and is quite weak.  Possible diagnosis of Parkinson's disease; status post neurology evaluation; Dr. Jerline Pain. ? ? ?Review of Systems  ?Constitutional:  Positive for malaise/fatigue and weight loss. Negative for chills, diaphoresis and fever.  ?HENT:  Negative for nosebleeds and sore throat.   ?Eyes:  Negative for double vision.  ?Respiratory:  Negative for cough, hemoptysis, sputum production, shortness of breath and wheezing.   ?Cardiovascular:  Negative for chest pain, palpitations, orthopnea and leg swelling.  ?Gastrointestinal:  Negative for abdominal pain, blood in stool, constipation, diarrhea, heartburn, melena, nausea and vomiting.  ?Genitourinary:  Negative for dysuria, frequency and urgency.  ?Musculoskeletal:  Negative for back pain and joint pain.  ?Skin: Negative.  Negative for itching and rash.  ?Neurological:  Positive for weakness. Negative for dizziness, tingling, focal weakness and headaches.  ?Endo/Heme/Allergies:  Does not bruise/bleed easily.  ?Psychiatric/Behavioral:  Negative for depression. The patient is not nervous/anxious and does not have insomnia.   ?  ? ?PAST MEDICAL HISTORY :  ?Past Medical History:  ?Diagnosis Date  ? Arthritis   ? Asthma   ? Cancer [facility])   ? Parkinson disease ([place])   ? Prostate cancer ([place])   ? ? ?PAST SURGICAL HISTORY :   ?Past Surgical History:  ?Procedure Laterality Date  ? KNEE ARTHROSCOPY Right 2012  ? also lt knee unsure of date  ? ? ?FAMILY HISTORY :   ?Family History  ?Problem Relation Age of Onset  ?  Arthritis/Rheumatoid Mother   ? Stroke Father   ? Hemangiomas Brother   ? ? ?SOCIAL HISTORY:   ?Social History  ? ?Tobacco Use  ? Smoking status: Never  ? Smokeless tobacco: Never  ?Vaping Use  ? Vaping Use: Never used  ?Substance Use Topics  ? Alcohol use: Yes  ? Drug use: Never  ? ? ?ALLERGIES:  has No Known Allergies. ? ?MEDICATIONS:  ?Current Outpatient  Medications  ?Medication Sig Dispense Refill  ? acetaminophen (TYLENOL) 325 MG tablet Take by mouth.    ? carbidopa-levodopa (SINEMET IR) 25-100 MG tablet Take 1 tablet by mouth 3 (three) times daily. 90 tablet 3  ? Degarelix Acetate (FIRMAGON [place]) Inject into the skin. (Patient not taking: Reported on 10/23/2021)    ? ?No current facility-administered medications for this visit.  ? ? ?PHYSICAL EXAMINATION: ?ECOG PERFORMANCE STATUS: 2 - Symptomatic, <50% confined to bed ? ?BP 102/64   Pulse 75   Temp 99.1 ?F (37.3 ?C)   Resp 16   Ht '5\' 8"'$  (1.727 m)   Wt 121 lb 9.6 oz (55.2 kg)   BMI 18.49 kg/m?  ? ?Filed Weights  ? 10/23/21 1010  ?Weight: 121 lb 9.6 oz (55.2 kg)  ? ? ?Physical Exam ?Vitals and nursing note reviewed.  ?HENT:  ?   Head: Normocephalic and atraumatic.  ?   Mouth/Throat:  ?   Pharynx: Oropharynx is clear.  ?Eyes:  ?   Extraocular Movements: Extraocular movements intact.  ?   Pupils: Pupils are equal, round, and reactive to light.  ?Cardiovascular:  ?   Rate and Rhythm: Normal rate and regular rhythm.  ?Pulmonary:  ?   Comments: Decreased breath sounds bilaterally.  ?Abdominal:  ?   Palpations: Abdomen is soft.  ?Musculoskeletal:     ?   General: Normal range of motion.  ?   Cervical back: Normal range of motion.  ?Skin: ?   General: Skin is warm.  ?Neurological:  ?   General: No focal deficit present.  ?   Mental Status: He is alert and oriented to person, place, and time.  ?Psychiatric:     ?   Behavior: Behavior normal.     ?   Judgment: Judgment normal.  ? ? ?LABORATORY DATA:  ?I have reviewed the data as listed ?   ?Component Value Date/Time  ? NA 136 10/23/2021 1012  ? NA 140 07/31/2021 1612  ? K 4.0 10/23/2021 1012  ? CL 100 10/23/2021 1012  ? CO2 30 10/23/2021 1012  ? GLUCOSE 95 10/23/2021 1012  ? BUN 20 10/23/2021 1012  ? BUN 25 07/31/2021 1612  ? CREATININE 1.07 10/23/2021 1012  ? CALCIUM 9.0 10/23/2021 1012  ? PROT 7.2 10/23/2021 1012  ? PROT 6.5 07/31/2021 1612  ? ALBUMIN 4.0 10/23/2021  1012  ? ALBUMIN 4.3 07/31/2021 1612  ? AST 40 10/23/2021 1012  ? ALT 51 (H) 10/23/2021 1012  ? ALKPHOS 84 10/23/2021 1012  ? BILITOT 0.6 10/23/2021 1012  ? BILITOT 0.2 07/31/2021 1612  ? GFRNONAA >60 10/23/2021 1012  ? ? ?No results found for: SPEP, UPEP ? ?Lab Results  ?Component Value Date  ? WBC 8.3 10/23/2021  ? NEUTROABS 5.7 10/23/2021  ? HGB 13.4 10/23/2021  ? HCT 40.4 10/23/2021  ? MCV 89.4 10/23/2021  ? PLT 301 10/23/2021  ? ? ?  Chemistry   ?   ?Component Value Date/Time  ? NA 136 10/23/2021 1012  ? NA 140 07/31/2021 1612  ? K 4.0  10/23/2021 1012  ? CL 100 10/23/2021 1012  ? CO2 30 10/23/2021 1012  ? BUN 20 10/23/2021 1012  ? BUN 25 07/31/2021 1612  ? CREATININE 1.07 10/23/2021 1012  ?    ?Component Value Date/Time  ? CALCIUM 9.0 10/23/2021 1012  ? ALKPHOS 84 10/23/2021 1012  ? AST 40 10/23/2021 1012  ? ALT 51 (H) 10/23/2021 1012  ? BILITOT 0.6 10/23/2021 1012  ? BILITOT 0.2 07/31/2021 1612  ?  ? ? ? ?RADIOGRAPHIC STUDIES: ?I have personally reviewed the radiological images as listed and agreed with the findings in the report. ?No results found.  ? 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  Proceed with Eligard 45 mg every 6 months today. ? ?#No obvious clinical progression of disease noted; PSA from today is pending. ? ?#Discussed additional novel ADT therapies-to help prolong PFS and also survival.  However patient has difficulty swallowing-which is chronic.  Discussed with pharmacy that since [place] this could be added to patient's treatment plan.  ? ? However would recommend starting at half the dose [120 mg/day] given patient's borderline performance status other comorbidities. I discussed the potential mechanism of action; and also potential side effects including but not limited to fatigue/hot flashes.  Discussed with pharmacy.  Checking insurance  options. ? ?#Indwelling Foley catheter-s/p evaluation with Dr. Erlene Quan.  Failed trial of voiding [MAY  2023]- ?  Suprapubic catheter.  Awaiting appointment again in June 2023.  ? ?# Dysphagia- [> 30 years-] ?  Esophageal spasms

## 2021-10-23 NOTE — Progress Notes (Signed)
Established patient visit  I,[name],acting as a scribe for Wilhemena Durie, MD.,have documented all relevant documentation on the behalf of Wilhemena Durie, MD,as directed by  Wilhemena Durie, MD while in the presence of Wilhemena Durie, MD.   Patient: Philip [name]   DOB: 03-13-1946   76 y.o. Male  MRN: [id number] Visit Date: 10/23/2021  Today's healthcare provider: Wilhemena Durie, MD   Chief Complaint  Patient presents with   Follow-up   Subjective    HPI  Follow up for Dysphagia - Primary:  The patient was last seen for this 3 weeks ago. Changes made at last visit include; Ambulatory referral to Home Health. ----------------------------------------------------------------------------------------- Review  made with patient and his brother of all his medical problems. He is having trouble swallowing but has not been tried on any medications at all for this. He might be a little bit improved on Sinemet with his Parkinson's.  He is getting stronger with walking. He is using a walker to get around. Still has an indwelling Foley with catheter and bag.  Medications: Outpatient Medications Prior to Visit  Medication Sig   acetaminophen (TYLENOL) 325 MG tablet Take by mouth.   carbidopa-levodopa (SINEMET IR) 25-100 MG tablet Take 1 tablet by mouth 3 (three) times daily.   Degarelix Acetate (FIRMAGON [place]) Inject into the skin.   No facility-administered medications prior to visit.    Review of Systems  Constitutional:  Negative for appetite change, chills and fever.  Respiratory:  Negative for chest tightness, shortness of breath and wheezing.   Cardiovascular:  Negative for chest pain and palpitations.  Gastrointestinal:  Negative for abdominal pain, nausea and vomiting.   Last CBC Lab Results  Component Value Date   WBC 8.3 10/23/2021   HGB 13.4 10/23/2021   HCT 40.4 10/23/2021   MCV 89.4 10/23/2021   MCH 29.6 10/23/2021   RDW 13.6 10/23/2021    PLT 301 10/23/2021       Objective    BP (!) 104/58 (BP Location: Left Arm, Patient Position: Sitting, Cuff Size: Normal)   Pulse 66   Resp 16   Wt 121 lb (54.9 kg)   SpO2 98%   BMI 18.40 kg/m  BP Readings from Last 3 Encounters:  10/23/21 (!) 104/58  10/23/21 102/64  10/03/21 104/79   Wt Readings from Last 3 Encounters:  10/23/21 121 lb (54.9 kg)  10/23/21 121 lb 9.6 oz (55.2 kg)  10/03/21 118 lb 12.8 oz (53.9 kg)      Physical Exam Vitals reviewed.  Constitutional:      Comments: Cachectic gentleman in no acute distress sitting in a wheelchair  HENT:     Head: Normocephalic and atraumatic.     Right Ear: External ear normal.     Left Ear: External ear normal.  Eyes:     Conjunctiva/sclera: Conjunctivae normal.  Cardiovascular:     Rate and Rhythm: Normal rate and regular rhythm.     Heart sounds: Normal heart sounds.  Pulmonary:     Breath sounds: Normal breath sounds.  Abdominal:     Palpations: Abdomen is soft.  Skin:    General: Skin is warm and dry.  Neurological:     Mental Status: He is alert and oriented to person, place, and time.     Comments: Blank facies with right hand tremor.  Has cogwheeling right arm greater than left arm.  He has a shuffling gait. I did walking better with walker  on this visit  Psychiatric:        Mood and Affect: Mood normal.        Behavior: Behavior normal.        Thought Content: Thought content normal.        Judgment: Judgment normal.      Results for orders placed or performed in visit on 10/23/21  Comprehensive metabolic panel  Result Value Ref Range   Sodium 136 135 - 145 mmol/L   Potassium 4.0 3.5 - 5.1 mmol/L   Chloride 100 98 - 111 mmol/L   CO2 30 22 - 32 mmol/L   Glucose, Bld 95 70 - 99 mg/dL   BUN 20 8 - 23 mg/dL   Creatinine, Ser 1.07 0.61 - 1.24 mg/dL   Calcium 9.0 8.9 - 10.3 mg/dL   Total Protein 7.2 6.5 - 8.1 g/dL   Albumin 4.0 3.5 - 5.0 g/dL   AST 40 15 - 41 U/L   ALT 51 (H) 0 - 44 U/L    Alkaline Phosphatase 84 38 - 126 U/L   Total Bilirubin 0.6 0.3 - 1.2 mg/dL   GFR, Estimated >60 >60 mL/min   Anion gap 6 5 - 15  CBC with Differential/Platelet  Result Value Ref Range   WBC 8.3 4.0 - 10.5 K/uL   RBC 4.52 4.22 - 5.81 MIL/uL   Hemoglobin 13.4 13.0 - 17.0 g/dL   HCT 40.4 39.0 - 52.0 %   MCV 89.4 80.0 - 100.0 fL   MCH 29.6 26.0 - 34.0 pg   MCHC 33.2 30.0 - 36.0 g/dL   RDW 13.6 11.5 - 15.5 %   Platelets 301 150 - 400 K/uL   nRBC 0.0 0.0 - 0.2 %   Neutrophils Relative % 68 %   Neutro Abs 5.7 1.7 - 7.7 K/uL   Lymphocytes Relative 18 %   Lymphs Abs 1.5 0.7 - 4.0 K/uL   Monocytes Relative 11 %   Monocytes Absolute 0.9 0.1 - 1.0 K/uL   Eosinophils Relative 2 %   Eosinophils Absolute 0.2 0.0 - 0.5 K/uL   Basophils Relative 1 %   Basophils Absolute 0.1 0.0 - 0.1 K/uL   Immature Granulocytes 0 %   Abs Immature Granulocytes 0.02 0.00 - 0.07 K/uL    Assessment & Plan     1. Gastroesophageal reflux disease, unspecified whether esophagitis present Start omeprazole capsule as he has trouble swallowing.  2. Dysphagia, unspecified type GI consult pending.  3. Prostate cancer metastatic to bone [facility]) On hormonal therapy. Considering chemotherapy.  4. Parkinson disease ([place]) Now on Sinemet.  5. Malnutrition of mild degree (H[facility]) Feeling stronger with time.  6. Pressure injury of other site, stage 2 ([place]) Resolving  7. Urinary retention By urology, Foley and bag in place     Return in about 5 months (around 03/25/2022).      I, Wilhemena Durie, MD, have reviewed all documentation for this visit. The documentation on 10/27/21 for the exam, diagnosis, procedures, and orders are all accurate and complete.    Shikita Vaillancourt Cranford Mon, MD  [facility] [phone] (phone) [phone] (fax)  [place]

## 2021-10-23 NOTE — Assessment & Plan Note (Addendum)
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  Proceed with Eligard 45 mg every 6 months today. ? ?#No obvious clinical progression of disease noted; PSA from today is pending. ? ?#Discussed additional novel ADT therapies-to help prolong PFS and also survival.  However patient has difficulty swallowing-which is chronic.  Discussed with pharmacy that since [place] this could be added to patient's treatment plan.  ? ? However would recommend starting at half the dose [120 mg/day] given patient's borderline performance status other comorbidities. I discussed the potential mechanism of action; and also potential side effects including but not limited to fatigue/hot flashes.  Discussed with pharmacy.  Checking insurance options. ? ?#Indwelling Foley catheter-s/p evaluation with Dr. Erlene Quan.  Failed trial of voiding [MAY  2023]- ?  Suprapubic catheter.  Awaiting appointment again in June 2023.  ? ?# Dysphagia- [> 30 years-] ?  Esophageal spasms recommend GI evaluation- refer to GI  ? ?#Parkinson's-[Dr.Potter]- on sinemet.  ? ?*Eligard '45mg'$ ; 5/17-2023 ?# DISPOSITION: ?# Eligard today ?# follow up in 6 weeks MD; labs- cbc/cmp;PSA-  Dr.B ? ?# 40 minutes face-to-face with the patient discussing the above plan of care; more than 50% of time spent on prognosis/ natural history; counseling and coordination.  The above plan of care was also discussed with patient and his brother in detail.  They are in agreement.  Discussed with Ebony Hail in pharmacy. ? ? ?

## 2021-10-23 NOTE — Progress Notes (Addendum)
? ?Oral Chemotherapy Clinic ?[place]  ?[phone] [phone] ? ?Patient Care Team: ?Jerrol Banana., MD as PCP - General (Family Medicine)  ? ?Name of the patient: Philip [name]  ?[id number]  ?07-13-1945  ? ?Date of visit: 10/23/21 ? ?HPI: Patient is a 76 y.o. male with metastatic castration-sensitive prostate cancer. Patient is already receiving ADT and Dr. Rogue Bussing would like the add apalutamide to his treatment regimen.  ? ?Reason for Consult: [name] (apalutamide) oral chemotherapy education. ? ? ?PAST MEDICAL HISTORY: ?Past Medical History:  ?Diagnosis Date  ? Arthritis   ? Asthma   ? Cancer [facility])   ? Parkinson disease ([place])   ? Prostate cancer [facility])   ? ? ?HEMATOLOGY/ONCOLOGY HISTORY:  ?Oncology History Overview Note  ?# DEC 2022 [X-mas- found s/p fall; [place]; PSA 962; s/p Bx of [name][Dr.[name]]; [place]/p ADT in [place]; FEB 2023- 62 [Dr.[name]] ? ?# n Christmas Day 2022 he fell and was no found for a couple of days and was found to be dehydrated and hypothermic.  Patient was admitted to the ICU in [place].  During hospitalization patient was found to have elevated PSA -  962 and mri showed extensive sclerosis in pelvic, sacral, spine, and scapula, prostate cancer metastatic to bone. Creatinine was 2.94 without normalization. He had severe sepsis and bacteruria.  ? ?At discharge patient was sent over to rehab.  Given the family in [place]/brother-patient moved to [place]. ? ?Prior to this episode patient was losing weight.  However patient had been very healthy prior to this episode.  He was living alone. ? ?Patient has chronic difficulty swallowing since age of 25.?  Esophagus spasms. ?  ?Prostate cancer metastatic to bone [facility])  ?08/14/2021 Initial Diagnosis  ? Prostate cancer metastatic to bone [facility]) ?  ? ? ?ALLERGIES:  has No Known Allergies. ? ?MEDICATIONS:  ?Current Outpatient Medications  ?Medication Sig Dispense Refill  ? acetaminophen (TYLENOL)  325 MG tablet Take by mouth.    ? carbidopa-levodopa (SINEMET [place]) 25-100 MG tablet Take 1 tablet by mouth 3 (three) times daily. 90 tablet 3  ? Degarelix Acetate (FIRMAGON [place]) Inject into the skin. (Patient not taking: Reported on 10/23/2021)    ? ?No current facility-administered medications for this visit.  ? ? ?VITAL SIGNS: ?There were no vitals taken for this visit. ?There were no vitals filed for this visit.  ?Estimated body mass index is 18.49 kg/m? as calculated from the following: ?  Height as of an earlier encounter on 10/23/21: '5\' 8"'$  (1.727 m). ?  Weight as of an earlier encounter on 10/23/21: 55.2 kg (121 lb 9.6 oz). ? ?LABS: ?CBC: ?   ?Component Value Date/Time  ? WBC 8.3 10/23/2021 1012  ? HGB 13.4 10/23/2021 1012  ? HGB 12.1 (L) 07/31/2021 1612  ? HCT 40.4 10/23/2021 1012  ? HCT 37.2 (L) 07/31/2021 1612  ? PLT 301 10/23/2021 1012  ? PLT 347 07/31/2021 1612  ? MCV 89.4 10/23/2021 1012  ? MCV 89 07/31/2021 1612  ? NEUTROABS 5.7 10/23/2021 1012  ? NEUTROABS 5.8 07/31/2021 1612  ? LYMPHSABS 1.5 10/23/2021 1012  ? LYMPHSABS 1.5 07/31/2021 1612  ? MONOABS 0.9 10/23/2021 1012  ? EOSABS 0.2 10/23/2021 1012  ? EOSABS 0.4 07/31/2021 1612  ? BASOSABS 0.1 10/23/2021 1012  ? BASOSABS 0.1 07/31/2021 1612  ? ?Comprehensive Metabolic Panel: ?   ?Component Value Date/Time  ? NA 136 10/23/2021 1012  ? NA 140 07/31/2021 1612  ? K 4.0 10/23/2021 1012  ?  CL 100 10/23/2021 1012  ? CO2 30 10/23/2021 1012  ? BUN 20 10/23/2021 1012  ? BUN 25 07/31/2021 1612  ? CREATININE 1.07 10/23/2021 1012  ? GLUCOSE 95 10/23/2021 1012  ? CALCIUM 9.0 10/23/2021 1012  ? AST 40 10/23/2021 1012  ? ALT 51 (H) 10/23/2021 1012  ? ALKPHOS 84 10/23/2021 1012  ? BILITOT 0.6 10/23/2021 1012  ? BILITOT 0.2 07/31/2021 1612  ? PROT 7.2 10/23/2021 1012  ? PROT 6.5 07/31/2021 1612  ? ALBUMIN 4.0 10/23/2021 1012  ? ALBUMIN 4.3 07/31/2021 1612  ? ? ? ?Present during today's visit: Patient and his brother, Philip [name].  ? ?Start plan: Pending medication  access ? ?CBC/CMP from 10/23/21 assessed, no relevant lab abnormalities. Prescription dose and frequency assessed.  ? ?Patient Education ?I spoke with patient for overview of new oral chemotherapy medication: [name]  ? ?Administration: ?Counseled patient on administration, dosing, side effects, monitoring, drug-food interactions, safe handling, storage, and disposal. ?Patient will take 2 tablets (120 mg total) by mouth daily. (reduced dose due to current performance status) ? ?**Patient is unable to swallow tablets. [name] 60 mg tablets can be dissolved in applesauce for administration. ? ?Side Effects: ?Side effects include but not limited to: ?- fatigue: recommended staying as active as possible. ?- rash: stay moisturized and wear sun protection when outside. ?- hot flashes ?- elevated blood pressure ?- decreased bone mineral density   ? ?Drug-drug Interactions (DDI): ?No DDIs identified ? ?Adherence: ?After discussion with patient no patient barriers to medication adherence identified.  ?Patient is currently living in assisted living where they administer him his medications. ? ?Mr. Foot voiced understanding and appreciation. All questions answered. Medication handout provided. ? ?Provided patient with [facility] phone number. Patient knows to call the office with questions or concerns. Oral Chemotherapy Navigation Clinic will continue to follow. ? ?Patient expressed understanding and was in agreement with this plan. He also understands that He can call clinic at any time with any questions, concerns, or complaints.  ? ?Medication Access Issues: ?Pending medication assistance ? ?Follow-up plan: RTC in 4 weeks  ? ?Thank you for allowing me to participate in the care of this patient.  ? ?Time Total: 30 ? ?Visit consisted of counseling and education on dealing with issues of symptom management in the setting of serious and potentially life-threatening illness.Greater than 50%  of this time  was spent counseling and coordinating care related to the above assessment and plan. ? ?Visit completed by: ?Deatra Robinson, PharmD, BCPS ?PGY2 Hematology/Oncology Pharmacy Resident ? ?Signed by: ?Darl Pikes, PharmD, BCPS, BCOP, CPP ?Hematology/Oncology Clinical Pharmacist Practitioner ?[place]/DB/AP Oral Chemotherapy Navigation Clinic ?[phone] ? ?10/23/2021 2:44 PM ? ?

## 2021-10-24 ENCOUNTER — Telehealth: Payer: Self-pay

## 2021-10-24 NOTE — Telephone Encounter (Signed)
Patient's brother notified

## 2021-10-24 NOTE — Telephone Encounter (Signed)
-----   Message from [name], MD sent at 10/24/2021  1:32 PM EDT ----- A/H-  Please inform the patient that is PSA is improving- currently 16; previously 63 three months ago.   Will await to see if Pharmacy is able to get his oral pills approved. Otherwise follow up as planned.  GB

## 2021-10-28 ENCOUNTER — Other Ambulatory Visit (HOSPITAL_COMMUNITY): Payer: Self-pay

## 2021-10-29 ENCOUNTER — Ambulatory Visit: Payer: Medicare Other | Admitting: Urology

## 2021-10-29 ENCOUNTER — Other Ambulatory Visit (HOSPITAL_COMMUNITY): Payer: Self-pay

## 2021-11-13 ENCOUNTER — Other Ambulatory Visit: Payer: Self-pay

## 2021-11-18 ENCOUNTER — Ambulatory Visit (INDEPENDENT_AMBULATORY_CARE_PROVIDER_SITE_OTHER): Payer: Medicare Other | Admitting: Gastroenterology

## 2021-11-18 ENCOUNTER — Encounter: Payer: Self-pay | Admitting: Gastroenterology

## 2021-11-18 ENCOUNTER — Telehealth: Payer: Self-pay | Admitting: Pharmacy Technician

## 2021-11-18 VITALS — BP 127/77 | HR 76 | Temp 98.7°F | Wt 129.0 lb

## 2021-11-18 DIAGNOSIS — R1310 Dysphagia, unspecified: Secondary | ICD-10-CM

## 2021-11-18 NOTE — Addendum Note (Signed)
Addended by: Wayna Chalet on: 11/18/2021 04:03 PM   Modules accepted: Orders

## 2021-11-18 NOTE — Addendum Note (Signed)
Addended by: Wayna Chalet on: 11/18/2021 04:04 PM   Modules accepted: Orders

## 2021-11-18 NOTE — Telephone Encounter (Signed)
Oral Oncology Patient Advocate Encounter  Met patient in [facility] lobby to complete application for [employer] Patient Assistance in an effort to reduce patient's out of pocket expense for [name] to $0.    Application completed and faxed to [phone].   [employer] patient assistance phone number for follow up is [phone].   This encounter will be updated until final determination.   [facility] [place] Phone [phone] Fax [phone] 11/18/2021 2:40 PM

## 2021-11-18 NOTE — Patient Instructions (Signed)
Please call central scheduling to schedule the barium swallow test with tablet at [phone].  Please call Toshi Ishii at [phone] to schedule your brother's upper endoscopy. The following dates are available: Tuesday 12/03/2021, Tuesday 12/17/2021, Tuesday 01/07/2022.

## 2021-11-18 NOTE — Progress Notes (Signed)
Jonathon Bellows MD, MRCP(U.K) [address]  [place], [place] [address]  Main: [phone]  Fax: [phone]   Gastroenterology Consultation  Referring Provider:   Dr. Rosanna Randy Primary Care Physician:  Jerrol Banana., MD Primary Gastroenterologist:  Dr. Jonathon Bellows  Reason for Consultation:     Dysphagia        HPI:   Philip [name] is a 76 y.o. y/o male referred for consultation & management  by Dr. Rosanna Randy, Retia Passe., MD.    He has been referred to see me for dysphagia.  He sees Dr. Rogue Bussing for prostate cancer.  He has a history of Parkinson's disease diagnosed in February 2023.   He has been commenced on Prilosec 20 mg once a day.  He states that he has had difficulty swallowing for over 35 years which is got worse recently.  Usually affect solids most likely meat than liquids.  Very uncommon for him to have any issues with choking or coughing.  He says about 6 years back physician apparently did an upper endoscopy but could not get past the esophagus as he was narrowed and was told that since he had this problem for many years there is no reason to do anything more about it.  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  Past Medical History:  Diagnosis Date   Arthritis    Asthma    Cancer ([place])    Parkinson disease ([place])    Prostate cancer [facility])     Past Surgical History:  Procedure Laterality Date   KNEE ARTHROSCOPY Right 2012   also lt knee unsure of date    Prior to Admission medications   Medication Sig Start Date End Date Taking? Authorizing Provider  acetaminophen (TYLENOL) 325 MG tablet Take by mouth.   Yes [provider]  amitriptyline (ELAVIL) 10 MG tablet Take 1 tablet by mouth at bedtime. 10/31/21 10/31/22 Yes [provider]  carbidopa-levodopa (SINEMET IR) 25-100 MG tablet Take 1 tablet by mouth 3 (three) times daily. 10/22/21  Yes Jerrol Banana., MD  Degarelix Acetate [facility])  Inject into the skin.   Yes [provider]  omeprazole (PRILOSEC) 20 MG capsule Take 1 capsule (20 mg total) by mouth every morning. 10/23/21  Yes Jerrol Banana., MD    Family History  Problem Relation Age of Onset   Arthritis/Rheumatoid Mother    Stroke Father    Hemangiomas Brother      Social History   Tobacco Use   Smoking status: Never   Smokeless tobacco: Never  Vaping Use   Vaping Use: Never used  Substance Use Topics   Alcohol use: Yes   Drug use: Never    Allergies as of 11/18/2021   (No Known Allergies)    Review of Systems:    All systems reviewed and negative except where noted in HPI.   Physical Exam:  BP 127/77   Pulse 76   Temp 98.7 F (37.1 C) (Oral)   Wt 129 lb (58.5 kg)   BMI 19.61 kg/m  No LMP for male patient. Psych:  Alert and cooperative. Normal mood and affect. General:   Alert,  Well-developed, well-nourished, pleasant and cooperative in NAD Head:  Normocephalic and atraumatic. Eyes:  Sclera clear, no icterus.   Conjunctiva pink. Ears:  Normal auditory acuity. Neurologic:  Alert and oriented x3;  grossly normal neurologically. Psych:  Alert and cooperative. Normal mood and affect.  Imaging Studies: No results found.  Assessment and Plan:   Philip [name] is a 76 y.o. 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  He is on Prilosec 20 mg once a day.  It appears that his dysphagia has been going on for 35 years and he gives a history of an attempted upper endoscopy 6 years back with the scope could not be pushed to the esophagus due to a narrowing suggesting a stricture.  Unclear why the gastroenterologist did not attempt to dilate the stricture at that point of time..  It is possible the dysphagia is got worse from the progression of the stricture or the effects of Parkinson's that was diagnosed recently.   Plan 1.  Continue taking the PPI 2.   Barium swallow with tablet followed by EGD, if there is stricture we will plan to dilate.  If negative will obtain further evaluation to rule out achalasia.  I have discussed alternative options, risks & benefits,  which include, but are not limited to, bleeding, infection, perforation,respiratory complication & drug reaction.  The patient agrees with this plan & written consent will be obtained.     Follow up in 8-12 weeks  Dr Jonathon Bellows MD,MRCP(U.K)

## 2021-11-19 ENCOUNTER — Ambulatory Visit: Payer: Medicare Other | Admitting: Physician Assistant

## 2021-11-20 ENCOUNTER — Telehealth: Payer: Self-pay

## 2021-11-20 ENCOUNTER — Ambulatory Visit: Payer: Medicare Other | Admitting: Physician Assistant

## 2021-11-20 ENCOUNTER — Encounter: Payer: Self-pay | Admitting: Physician Assistant

## 2021-11-20 ENCOUNTER — Ambulatory Visit (INDEPENDENT_AMBULATORY_CARE_PROVIDER_SITE_OTHER): Payer: Medicare Other | Admitting: Physician Assistant

## 2021-11-20 DIAGNOSIS — R339 Retention of urine, unspecified: Secondary | ICD-10-CM | POA: Diagnosis not present

## 2021-11-20 LAB — BLADDER SCAN AMB NON-IMAGING

## 2021-11-20 NOTE — Telephone Encounter (Signed)
Patient's brother-James called stating that he was able to schedule his brother's barium swallow. He stated that it is scheduled to be done on 11/25/2021. He had a concern. Jeneen Rinks stated that he is not able to swallow pills (tablets). He stated that it was one of his problems. So he didn't think that the barium swallow would be successful if he can't swallow the tablet. Please advise. Another thing that he wanted the physician to know is that he will do the EGD after the barium swallow once they have results. If not necessary, they will not do it.

## 2021-11-20 NOTE — Progress Notes (Signed)
   11/20/2021 4:38 PM   Philip [name] 02-Feb-1946 [id number]  CC: Chief Complaint  Patient presents with   Urinary Retention   HPI: Philip [name] is a 76 y.o. male with PMH metastatic prostate cancer managed by Dr. Rogue Bussing on ADT and urinary retention managed with indwelling Foley catheter with end-stage appearing bladder on cystoscopy who presents today for repeat voiding trial per his request.  He is accompanied today by his brother.  Foley catheter removed in the morning, see procedure note below.  He returned to clinic in the afternoon for bladder scan.  He reports drinking approximately 40 ounces of fluid.  He has urinated several times.  PVR 116 mL.  PMH: Past Medical History:  Diagnosis Date   Arthritis    Asthma    Cancer ([place])    Parkinson disease ([place])    Prostate cancer [facility])     Surgical History: Past Surgical History:  Procedure Laterality Date   KNEE ARTHROSCOPY Right 2012   also lt knee unsure of date    Home Medications:  Allergies as of 11/20/2021   No Known Allergies      Medication List        Accurate as of November 20, 2021  4:38 PM. If you have any questions, ask your nurse or doctor.          acetaminophen 325 MG tablet Commonly known as: TYLENOL Take by mouth.   amitriptyline 10 MG tablet Commonly known as: ELAVIL Take 1 tablet by mouth at bedtime.   carbidopa-levodopa 25-100 MG tablet Commonly known as: SINEMET IR Take 1 tablet by mouth 3 (three) times daily.   FIRMAGON [place] Inject into the skin.   omeprazole 20 MG capsule Commonly known as: PRILOSEC Take 1 capsule (20 mg total) by mouth every morning.        Allergies:  No Known Allergies  Family History: Family History  Problem Relation Age of Onset   Arthritis/Rheumatoid Mother    Stroke Father    Hemangiomas Brother     Social History:   reports that he has never smoked. He has never used smokeless tobacco. He reports current alcohol use. He reports that he does  not use drugs.  Physical Exam: There were no vitals taken for this visit.  Constitutional:  Alert and oriented, no acute distress, nontoxic appearing HEENT: [place], AT Cardiovascular: No clubbing, cyanosis, or edema Respiratory: Normal respiratory effort, no increased work of breathing Skin: No rashes, bruises or suspicious lesions Neurologic: Grossly intact, no focal deficits, moving all 4 extremities Psychiatric: Normal mood and affect  Laboratory Data: Results for orders placed or performed in visit on 11/20/21  Bladder Scan (Post Void Residual) in office  Result Value Ref Range   Scan Result 150m    Assessment & Plan:   1. Urinary retention Voiding trial passed today.  No indication for Foley catheter replacement.  Regardless, I provided a letter to serve as an order to replace Foley catheter for recurrent retention, irrigate catheter as needed, and replace catheter monthly in our clinic versus with home health.  I faxed the letter to his home health agency and gave him hardcopy for [facility] Bl[facility]Post [facility] Resid[facility]) in office  Return in about 4 weeks (around 12/18/2021) for Repeat PVR.  SDebroah Loop PA-C  [facility] [address] [place] [phone]

## 2021-11-21 ENCOUNTER — Telehealth: Payer: Self-pay

## 2021-11-21 NOTE — Telephone Encounter (Signed)
Called Mr. Marsean Elkhatib to ;et him know that Dr. Vicente Males is okay for his brother not to do his barium swallow w/o a tablet. I also told him that he will discuss EGD after the barium swallow. Mr. Grand understood and had no further questions.

## 2021-11-21 NOTE — Telephone Encounter (Signed)
Voicemail left on triage line from patient's home stating that they had to replace the foley yesterday evening and had 654m urine return, and would like to know when he should follow up. Spoke w/ LAnder Purpura RN at patient's home and explained that options are monthly foley exchange, SPT placement or UDS testing. She will relay this information to the patient. For now a verbal order was given per Sam for monthly cath exchange at the home: 16 FR coude foley , 10cc balloon exchanged monthly. Verbal read back given.

## 2021-11-22 ENCOUNTER — Telehealth: Payer: Self-pay

## 2021-11-22 NOTE — Telephone Encounter (Signed)
Incoming call from pt's who brother who states that the patient has a cloudy mixture in his catheter bag. Questioned brother if pt was having any UTI sx, brother states that he is not with the patient and is unsure what symptoms he is having. The patient lives at [facility]. Advised brother that if pt was having symptoms the physician at [facility] could attend to him, in addition if provider there has can concerns they can contact us and we are happy to help.

## 2021-11-22 NOTE — Telephone Encounter (Signed)
Pt now calling with concerns of cloudy urine. Pt lives at [facility] in [place], he did ask the CNA what she thought but she could not give him advice. I advised pt to drink plenty of water, pt did state he does not drink water. Pt will also ask the nursing staff about his urine. Pt is not having any symptoms or in any pain.

## 2021-11-25 ENCOUNTER — Ambulatory Visit
Admission: RE | Admit: 2021-11-25 | Discharge: 2021-11-25 | Disposition: A | Discharge status: Home / Self Care | Payer: Medicare Other | Source: Ambulatory Visit | Attending: Gastroenterology | Admitting: Gastroenterology

## 2021-11-25 DIAGNOSIS — R1310 Dysphagia, unspecified: Secondary | ICD-10-CM | POA: Diagnosis not present

## 2021-11-25 NOTE — Progress Notes (Signed)
Inform no significant narrowing seen , spasms noted, small hernia, ensure taking PPI twice daily - let us know if no better - next step if symptoms persist is endoscopy

## 2021-11-25 NOTE — Telephone Encounter (Signed)
Oral Oncology Patient Advocate Encounter  Received notification from [place] [facility] that patient has been successfully enrolled into their program to receive [name] from the manufacturer at $0 out of pocket until 06/08/22.    Patients first shipment was delivered on 11/21/21.  Patient knows to call the office with questions or concerns.   Oral Oncology Clinic will continue to follow.  [name] Phone [phone] Fax [phone] 11/25/2021 12:02 PM

## 2021-11-28 ENCOUNTER — Telehealth: Payer: Self-pay

## 2021-12-04 ENCOUNTER — Inpatient Hospital Stay: Payer: Medicare Other | Attending: Internal Medicine

## 2021-12-04 ENCOUNTER — Encounter: Payer: Self-pay | Admitting: Pharmacist

## 2021-12-04 ENCOUNTER — Inpatient Hospital Stay (HOSPITAL_BASED_OUTPATIENT_CLINIC_OR_DEPARTMENT_OTHER): Payer: Medicare Other | Admitting: Internal Medicine

## 2021-12-04 ENCOUNTER — Encounter: Payer: Self-pay | Admitting: Internal Medicine

## 2021-12-04 DIAGNOSIS — G20 Parkinson's disease: Secondary | ICD-10-CM | POA: Insufficient documentation

## 2021-12-04 DIAGNOSIS — C7951 Secondary malignant neoplasm of bone: Secondary | ICD-10-CM

## 2021-12-04 DIAGNOSIS — C61 Malignant neoplasm of prostate: Secondary | ICD-10-CM | POA: Diagnosis present

## 2021-12-04 DIAGNOSIS — R1310 Dysphagia, unspecified: Secondary | ICD-10-CM | POA: Diagnosis not present

## 2021-12-04 DIAGNOSIS — Z79899 Other long term (current) drug therapy: Secondary | ICD-10-CM | POA: Diagnosis not present

## 2021-12-04 LAB — COMPREHENSIVE METABOLIC PANEL
ALT: 11 U/L (ref 0–44)
AST: 26 U/L (ref 15–41)
Albumin: 3.8 g/dL (ref 3.5–5.0)
Alkaline Phosphatase: 70 U/L (ref 38–126)
Anion gap: 4 — ABNORMAL LOW (ref 5–15)
BUN: 19 mg/dL (ref 8–23)
CO2: 31 mmol/L (ref 22–32)
Calcium: 8.6 mg/dL — ABNORMAL LOW (ref 8.9–10.3)
Chloride: 102 mmol/L (ref 98–111)
Creatinine, Ser: 1.12 mg/dL (ref 0.61–1.24)
GFR, Estimated: >60 mL/min (ref >60)
Glucose, Bld: 102 mg/dL — ABNORMAL HIGH (ref 70–99)
Potassium: 3.9 mmol/L (ref 3.5–5.1)
Sodium: 137 mmol/L (ref 135–145)
Total Bilirubin: 0.6 mg/dL (ref 0.3–1.2)
Total Protein: 6.6 g/dL (ref 6.5–8.1)

## 2021-12-04 LAB — CBC WITH DIFFERENTIAL/PLATELET
Abs Immature Granulocytes: 0.02 10*3/uL (ref 0.00–0.07)
Basophils Absolute: 0.1 10*3/uL (ref 0.0–0.1)
Basophils Relative: 1 %
Eosinophils Absolute: 0.3 10*3/uL (ref 0.0–0.5)
Eosinophils Relative: 4 %
HCT: 36.6 % — ABNORMAL LOW (ref 39.0–52.0)
Hemoglobin: 12.3 g/dL — ABNORMAL LOW (ref 13.0–17.0)
Immature Granulocytes: 0 %
Lymphocytes Relative: 22 %
Lymphs Abs: 1.6 10*3/uL (ref 0.7–4.0)
MCH: 30.4 pg (ref 26.0–34.0)
MCHC: 33.6 g/dL (ref 30.0–36.0)
MCV: 90.6 fL (ref 80.0–100.0)
Monocytes Absolute: 0.6 10*3/uL (ref 0.1–1.0)
Monocytes Relative: 9 %
Neutro Abs: 4.6 10*3/uL (ref 1.7–7.7)
Neutrophils Relative %: 64 %
Platelets: 277 10*3/uL (ref 150–400)
RBC: 4.04 MIL/uL — ABNORMAL LOW (ref 4.22–5.81)
RDW: 13.6 % (ref 11.5–15.5)
WBC: 7.2 10*3/uL (ref 4.0–10.5)
nRBC: 0 % (ref 0.0–0.2)

## 2021-12-04 LAB — PSA: Prostatic Specific Antigen: 8.25 ng/mL — ABNORMAL HIGH (ref 0.00–4.00)

## 2021-12-04 NOTE — Progress Notes (Signed)
[place] OFFICE PROGRESS NOTE  Patient Care Team: Jerrol Banana., MD as PCP - General [facility])   Cancer Staging  No matching staging information was found for the patient.   Oncology History Overview Note  # DEC 2022 [X-mas- found s/p fall; [place]; PSA 962; s/p Bx of [name][Dr.[name]]; s/p ADT in [place]; FEB 2023- 62 [Dr.[name]]  # n Christmas Day 2022 he fell and was no found for a couple of days and was found to be dehydrated and hypothermic.  Patient was admitted to the ICU in [place].  During hospitalization patient was found to have elevated PSA -  962 and mri showed extensive sclerosis in pelvic, sacral, spine, and scapula, prostate cancer metastatic to bone. Creatinine was 2.94 without normalization. He had severe sepsis and bacteruria.   At discharge patient was sent over to rehab.  Given the family in [place]/brother-patient moved to [place].  Prior to this episode patient was losing weight.  However patient had been very healthy prior to this episode.  He was living alone.  Patient has chronic difficulty swallowing since age of 57.?  Esophagus spasms.   Prostate cancer metastatic to bone ([place])  08/14/2021 Initial Diagnosis   Prostate cancer metastatic to bone ([place])      HISTORY OF PRESENT ILLNESS: Patient in a wheelchair.  Patient lives in assisted living; accompanied by his brother.  Brandn Mcgath 76 y.o.  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  Patient currently undergoing trial of voiding; unfortunately unsuccessful so far  Patient continues to have indwelling Foley catheter.  Patient is interested in alternative options.  In the interim s/p evaluation with GI- s/p barium study-suggestive of spasms.  Otherwise has not had any hospitalizations.  Denies any worsening joint pains or bone pain.  Denies any hot flashes.  He remains in a wheelchair  and is quite weak.  However gaining weight.  Appetite is improving.  Also recently diagnosed with Parkinson's disease; status post neurology evaluation; Dr. Melrose Nakayama  Review of Systems  Constitutional:  Positive for malaise/fatigue and weight loss. Negative for chills, diaphoresis and fever.  HENT:  Negative for nosebleeds and sore throat.   Eyes:  Negative for double vision.  Respiratory:  Negative for cough, hemoptysis, sputum production, shortness of breath and wheezing.   Cardiovascular:  Negative for chest pain, palpitations, orthopnea and leg swelling.  Gastrointestinal:  Negative for abdominal pain, blood in stool, constipation, diarrhea, heartburn, melena, nausea and vomiting.  Genitourinary:  Negative for dysuria, frequency and urgency.  Musculoskeletal:  Negative for back pain and joint pain.  Skin: Negative.  Negative for itching and rash.  Neurological:  Positive for weakness. Negative for dizziness, tingling, focal weakness and headaches.  Endo/Heme/Allergies:  Does not bruise/bleed easily.  Psychiatric/Behavioral:  Negative for depression. The patient is not nervous/anxious and does not have insomnia.       PAST MEDICAL HISTORY :  Past Medical History:  Diagnosis Date   Arthritis    Asthma    Cancer ([place])    Parkinson disease ([place])    Prostate cancer ([place])     PAST SURGICAL HISTORY :   Past Surgical History:  Procedure Laterality Date   KNEE ARTHROSCOPY Right 2012   also lt knee unsure of date    FAMILY HISTORY :   Family History  Problem Relation Age of Onset   Arthritis/Rheumatoid Mother    Stroke Father  Hemangiomas Brother     SOCIAL HISTORY:   Social History   Tobacco Use   Smoking status: Never   Smokeless tobacco: Never  Vaping Use   Vaping Use: Never used  Substance Use Topics   Alcohol use: Yes   Drug use: Never    ALLERGIES:  has No Known Allergies.  MEDICATIONS:  Current Outpatient Medications  Medication Sig Dispense Refill    acetaminophen (TYLENOL) 325 MG tablet Take by mouth.     amitriptyline (ELAVIL) 10 MG tablet Take 1 tablet by mouth at bedtime.     carbidopa-levodopa (SINEMET IR) 25-100 MG tablet Take 1 tablet by mouth 3 (three) times daily. (Patient taking differently: Take 2 tablets by mouth 3 (three) times daily.) 90 tablet 3   Degarelix Acetate (FIRMAGON [place]) Inject into the skin.     omeprazole (PRILOSEC) 20 MG capsule Take 1 capsule (20 mg total) by mouth every morning. 90 capsule 3   No current facility-administered medications for this visit.    PHYSICAL EXAMINATION: ECOG PERFORMANCE STATUS: 2 - Symptomatic, <50% confined to bed  BP 115/64 (BP Location: Right Arm, Patient Position: Sitting, Cuff Size: Normal)   Pulse 79   Temp 98.1 F (36.7 C) (Tympanic)   Ht '5\' 8"'$  (1.727 m)   Wt 133 lb 6.4 oz (60.5 kg)   SpO2 98%   BMI 20.28 kg/m   Filed Weights   12/04/21 1043  Weight: 133 lb 6.4 oz (60.5 kg)    Physical Exam Vitals and nursing note reviewed.  HENT:     Head: Normocephalic and atraumatic.     Mouth/Throat:     Pharynx: Oropharynx is clear.  Eyes:     Extraocular Movements: Extraocular movements intact.     Pupils: Pupils are equal, round, and reactive to light.  Cardiovascular:     Rate and Rhythm: Normal rate and regular rhythm.  Pulmonary:     Comments: Decreased breath sounds bilaterally.  Abdominal:     Palpations: Abdomen is soft.  Musculoskeletal:        General: Normal range of motion.     Cervical back: Normal range of motion.  Skin:    General: Skin is warm.  Neurological:     General: No focal deficit present.     Mental Status: He is alert and oriented to person, place, and time.  Psychiatric:        Behavior: Behavior normal.        Judgment: Judgment normal.     LABORATORY DATA:  I have reviewed the data as listed    Component Value Date/Time   NA 137 12/04/2021 1030   NA 140 07/31/2021 1612   K 3.9 12/04/2021 1030   CL 102 12/04/2021 1030   CO2  31 12/04/2021 1030   GLUCOSE 102 (H) 12/04/2021 1030   BUN 19 12/04/2021 1030   BUN 25 07/31/2021 1612   CREATININE 1.12 12/04/2021 1030   CALCIUM 8.6 (L) 12/04/2021 1030   PROT 6.6 12/04/2021 1030   PROT 6.5 07/31/2021 1612   ALBUMIN 3.8 12/04/2021 1030   ALBUMIN 4.3 07/31/2021 1612   AST 26 12/04/2021 1030   ALT 11 12/04/2021 1030   ALKPHOS 70 12/04/2021 1030   BILITOT 0.6 12/04/2021 1030   BILITOT 0.2 07/31/2021 1612   GFRNONAA >60 12/04/2021 1030    No results found for: "SPEP", "UPEP"  Lab Results  Component Value Date   WBC 7.2 12/04/2021   NEUTROABS 4.6 12/04/2021   HGB 12.3 (L)  12/04/2021   HCT 36.6 (L) 12/04/2021   MCV 90.6 12/04/2021   PLT 277 12/04/2021      Chemistry      Component Value Date/Time   NA 137 12/04/2021 1030   NA 140 07/31/2021 1612   K 3.9 12/04/2021 1030   CL 102 12/04/2021 1030   CO2 31 12/04/2021 1030   BUN 19 12/04/2021 1030   BUN 25 07/31/2021 1612   CREATININE 1.12 12/04/2021 1030      Component Value Date/Time   CALCIUM 8.6 (L) 12/04/2021 1030   ALKPHOS 70 12/04/2021 1030   AST 26 12/04/2021 1030   ALT 11 12/04/2021 1030   BILITOT 0.6 12/04/2021 1030   BILITOT 0.2 07/31/2021 1612       RADIOGRAPHIC STUDIES: I have personally reviewed the radiological images as listed and agreed with the findings in the report. No results found.   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  Proceed with Eligard 45 mg every 6 months today.  # No obvious clinical progression of disease noted; PSA from today is pending.  #Discussed additional novel ADT therapies-to help prolong PFS and also survival.  However patient has difficulty swallowing-which is chronic.  Discussed with pharmacy that since [place] could be  Crushed-this would be recommended.  #Patient had pills available to get started.  They  want to wait until next visit to decide.  However given the logistics [patient/brother]-declined starting Erleda at this time.  Extensive discussion with the pharmacist.  #Indwelling Foley catheter-s/p evaluation with Dr. Erlene Quan.  Failed trial of voiding [JUNE 2023]- ?  Suprapubic catheter.  Awaiting appointment again in July 2023.   # Dysphagia- [> 30 years-] ?  Esophageal spasms status post barium swallow.  Reviewed GI recommendations.  Continue follow-up with GI.  #Parkinson's-[Dr.Potter]- on sinemet. STABLE.   *Eligard '45mg'$ ; 5/17-2023  # DISPOSITION: # follow up in 2 months- MD; labs- cbc/cmp;PSA-  Dr.B      Orders Placed This Encounter  Procedures   CBC with Differential/Platelet    Standing Status:   Future    Standing Expiration Date:   12/05/2022   Comprehensive metabolic panel    Standing Status:   Future    Standing Expiration Date:   12/05/2022   PSA    Standing Status:   Future    Standing Expiration Date:   12/05/2022   All questions were answered. The patient knows to call the clinic with any problems, questions or concerns.      [name], MD 12/04/2021 12:57 PM

## 2021-12-04 NOTE — Progress Notes (Signed)
Oral Chemotherapy Pharmacist Encounter   Saw patient and his brother in clinic today to discuss the initiation of [name] and how we would coordinated with the nursing facility for the administration order.   Patient's brother was provided with the number to the [facility] Patient Assistance program to call to find out about the refill process.  At the end of the discussion, patient and his brother stated that they would want to wait until the next appt with Dr. Rogue Bussing in 2 months to decide on starting the [place].   Dr. Rogue Bussing was informed of the patient's decision to hold on starting the Terese Door, PharmD, BCPS, BCOP, CPP Hematology/Oncology Clinical Pharmacist [place]/DB/AP [facility] [phone]  12/04/2021 10:04 PM

## 2021-12-04 NOTE — Assessment & Plan Note (Addendum)
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  Proceed with Eligard 45 mg every 6 months today.  # No obvious clinical progression of disease noted; PSA from today is pending.  #Discussed additional novel ADT therapies-to help prolong PFS and also survival.  However patient has difficulty swallowing-which is chronic.  Discussed with pharmacy that since [place] could be  Crushed-this would be recommended.  #Patient had pills available to get started.  They want to wait until next visit to decide.  However given the logistics [patient/brother]-declined starting Erleda at this time.  Extensive discussion with the pharmacist.  #Indwelling Foley catheter-s/p evaluation with Dr. Erlene Quan.  Failed trial of voiding [JUNE 2023]- ?  Suprapubic catheter.  Awaiting appointment again in July 2023.   # Dysphagia- [> 30 years-] ?  Esophageal spasms status post barium swallow.  Reviewed GI recommendations.  Continue follow-up with GI.  #Parkinson's-[Dr.Potter]- on sinemet. STABLE.   *Eligard '45mg'$ ; 5/17-2023  # DISPOSITION: # follow up in 2 months- MD; labs- cbc/cmp;PSA-  Dr.B

## 2021-12-05 ENCOUNTER — Ambulatory Visit: Payer: Medicare Other | Admitting: Physician Assistant

## 2021-12-18 ENCOUNTER — Ambulatory Visit (INDEPENDENT_AMBULATORY_CARE_PROVIDER_SITE_OTHER): Payer: Medicare Other | Admitting: Physician Assistant

## 2021-12-18 VITALS — BP 121/69 | HR 81 | Ht 69.0 in | Wt 133.0 lb

## 2021-12-18 DIAGNOSIS — N3289 Other specified disorders of bladder: Secondary | ICD-10-CM

## 2021-12-18 DIAGNOSIS — R339 Retention of urine, unspecified: Secondary | ICD-10-CM

## 2021-12-18 NOTE — Progress Notes (Signed)
Cath Change/ Replacement  Patient is present today for a catheter change due to urinary retention.  45m of water was removed from the balloon, a 16FR foley cath was removed without difficulty.  Patient was cleaned and prepped in a sterile fashion with betadine and 2% lidocaine jelly was instilled into the urethra. A 16 FR coud foley cath was replaced into the bladder no complications were noted Urine return was noted 157mand urine was yellow in color. The balloon was filled with 1058mf sterile water. A leg bag was attached for drainage.  Patient tolerated well.    Performed by: SamDebroah LoopA-C  Additional notes: Patient appeared to pass a voiding trial with me at his last clinic visit on 11/20/2021.  He subsequently was unable to void and [facility] about HOLEP versus UroLift versus PAE.  He had previously declined urodynamics and Dr. BraErlene Quand offered him a channel TURP.  We discussed that if he was interested in pursuing an outlet procedure he would need to undergo urodynamics first due to his end-stage appearing bladder on cystoscopy.  We had a very lengthy conversation in which I explained repeatedly that if his bladder does not function, there is no bladder outlet or other surgical procedure that we will be able to get him voiding spontaneously.  He was very perseverant today on his bladder spasms and is certain that these indicate that he would be able to void on his own despite having failed numerous voiding trials.  I again attempted to explain to him that experiencing bladder spasms secondary to an indwelling Foley catheter does not necessarily correspond to a patient being able to successfully empty the bladder.  He is willing to pursue urodynamics, referral placed today.  Follow up: Return in about 4 weeks (around 01/15/2022) for Catheter exchange.  I spent 25 minutes on the day of the encounter to include pre-visit  record review, face-to-face time with the patient, and post-visit ordering of tests.

## 2021-12-26 LAB — SPECIMEN STATUS REPORT

## 2021-12-26 LAB — PSA: Prostate Specific Ag, Serum: 62.2 ng/mL — ABNORMAL HIGH (ref 0.0–4.0)

## 2022-01-15 ENCOUNTER — Other Ambulatory Visit: Payer: Self-pay | Admitting: Physician Assistant

## 2022-01-15 ENCOUNTER — Ambulatory Visit (INDEPENDENT_AMBULATORY_CARE_PROVIDER_SITE_OTHER): Payer: Medicare Other | Admitting: Physician Assistant

## 2022-01-15 VITALS — BP 119/70 | HR 72 | Wt 136.0 lb

## 2022-01-15 DIAGNOSIS — R339 Retention of urine, unspecified: Secondary | ICD-10-CM | POA: Diagnosis not present

## 2022-01-16 ENCOUNTER — Other Ambulatory Visit: Payer: Self-pay | Admitting: Physician Assistant

## 2022-01-16 ENCOUNTER — Telehealth: Payer: Self-pay | Admitting: Physician Assistant

## 2022-01-16 DIAGNOSIS — N401 Enlarged prostate with lower urinary tract symptoms: Secondary | ICD-10-CM

## 2022-01-16 NOTE — Progress Notes (Signed)
01/15/2022 2:53 PM   Philip [name] 1945/06/29 [id number]  CC: Chief Complaint  Patient presents with   Results   HPI: Philip [name] is a 76 y.o. male with PMH static prostate cancer managed by Dr. Rogue Bussing on ADT and urinary retention managed with indwelling Foley catheter with end-stage appearing bladder on cystoscopy who presents today to discuss urodynamics results.  He is accompanied today by his brother, who contributes to HPI.  He underwent urodynamics on 01/07/2022.  Foley catheter was exchanged at that time.  During testing, he had bladder sensation and was able to generate an unstable voluntary contraction.  Voided volume was 39 mL, max flow 2 mL/s.  Detrusor peak pressure was 54.  Max detrusor pressure was 63.  Overall, this was felt to be consistent with an obstructive urinary pattern despite low volume of spontaneous void.  Today he reports he wishes to try everything possible to get out of retention and is interested in pursuing surgery if possible.  His brother's wife has an upcoming knee replacement procedure and they would like to time his surgery so that these 2 procedures do not overlap so that his brother can care for both patients postoperatively.  PMH: Past Medical History:  Diagnosis Date   Arthritis    Asthma    Cancer ([place])    Parkinson disease ([place])    Prostate cancer [facility])     Surgical History: Past Surgical History:  Procedure Laterality Date   KNEE ARTHROSCOPY Right 2012   also lt knee unsure of date    Home Medications:  Allergies as of 01/15/2022   No Known Allergies      Medication List        Accurate as of January 15, 2022 11:59 PM. If you have any questions, ask your nurse or doctor.          acetaminophen 325 MG tablet Commonly known as: TYLENOL Take by mouth.   amitriptyline 10 MG tablet Commonly known as: ELAVIL Take 1 tablet by mouth at bedtime.   carbidopa-levodopa 25-100 MG tablet Commonly known as: SINEMET IR Take 1  tablet by mouth 3 (three) times daily. What changed: how much to take   [facility] Inject into the skin.   omeprazole 20 MG capsule Commonly known as: PRILOSEC Take 1 capsule (20 mg total) by mouth every morning.        Allergies:  No Known Allergies  Family History: Family History  Problem Relation Age of Onset   Arthritis/Rheumatoid Mother    Stroke Father    Hemangiomas Brother     Social History:   reports that he has never smoked. He has never used smokeless tobacco. He reports current alcohol use. He reports that he does not use drugs.  Physical Exam: BP 119/70   Pulse 72   Wt 136 lb (61.7 kg)   BMI 20.08 kg/m   Constitutional:  Alert and oriented, no acute distress, nontoxic appearing HEENT: [place], AT Cardiovascular: No clubbing, cyanosis, or edema Respiratory: Normal respiratory effort, no increased work of breathing Skin: No rashes, bruises or suspicious lesions Neurologic: Grossly intact, no focal deficits, moving all 4 extremities Psychiatric: Normal mood and affect  Assessment & Plan:   1. Urinary retention I reviewed the urodynamics results with Dr. Erlene Quan and then had a lengthy conversation with the patient and his brother in clinic today.  We discussed that while he was able to generate involuntary void, his overall void volume was rather low.  Overall, if  he were to pursue the channel TURP that Dr. Erlene Quan recommends, there is an approximate 50% chance of him being able to spontaneously and sufficiently void on his own that he could come off the catheter.  Patient continues to be rather perseverant on the idea of pursuing HOLEP despite Dr. Cherrie Gauze recommendation for channel TURP.  We discussed that her concern with HOLEP is his increased risk for significant urinary leakage.  We discussed that channel TURP is still an effective procedure and that recovery time would be approximately equivalent to a HOLEP and I do not anticipate an overnight hospital stay  unless he has significant intraoperative bleeding.  Patient's brother wonders if they should get a second opinion but ultimately declines this.  After much consideration, they would like to proceed with a channel TURP with Dr. Erlene Quan.  Based on OR timing, we are targeting 02/24/2022.  As he is leaving clinic today, patient requested to be put on another surgeon schedule for his surgery that it could be performed sooner.  I stated that we do not typically do this, but would reach out to him once I spoke with Dr. Erlene Quan.  Return in about 4 weeks (around 02/12/2022) for Catheter change and preop UA/culture, channel TURP with Dr. Erlene Quan on 02/24/2022.  Debroah Loop, PA-C  [facility] [address] [place], [place] [address] [phone]

## 2022-01-16 NOTE — Telephone Encounter (Signed)
Spoke with brother ( per DPR) and advised results. They will stay with the 02/24/2022 surgery date.

## 2022-01-16 NOTE — Telephone Encounter (Signed)
Please let the patient know that I spoke with Dr. Erlene Quan after he left clinic yesterday.  In our clinic, we do not put patients on other surgeon's schedules so they can have nonemergent surgery sooner.  I would be happy to get him booked for channel TURP with Dr. Erlene Quan on 02/24/2022 as discussed yesterday.  If they would like a second opinion with another provider within our clinic, okay to schedule him for a clinic visit with either other provider to discuss his surgical options, but there is no guarantee that they will be able to perform his case sooner.  Please let me know how they would like to proceed.

## 2022-01-16 NOTE — Progress Notes (Signed)
Surgical Physician [facility]  Dr. Erlene Quan * Scheduling expectation :  02/24/2022  *Length of Case:   *Clearance needed: no  *Anticoagulation Instructions: N/A  *Aspirin Instructions: N/A  *Post-op visit Date/Instructions:  1-3 day voiding trial  *Diagnosis: BPH w/urinary obstruction  *Procedure:   Channel TURP   Additional orders: N/A  -Admit type: OUTpatient  -Anesthesia: General  -VTE Prophylaxis Standing Order SCD's       Other:   -Standing Lab Orders Per Anesthesia    Lab other: UA&Urine Culture (will be performed in clinic on 9/8)  -Standing Test orders EKG/Chest x-ray per Anesthesia       Test other:   - Medications:  Ancef 1gm IV  -Other orders:  N/A

## 2022-01-23 ENCOUNTER — Telehealth: Payer: Self-pay

## 2022-01-23 NOTE — Telephone Encounter (Signed)
Copied from [place] [id number]. Topic: Referral - Status >> Jan 23, 2022  4:56 PM Cyndi Bender wrote: Reason for CRM: Pt brother Jeneen Rinks requests referral to another Urology office. Cb# [phone] or [phone]

## 2022-01-24 ENCOUNTER — Other Ambulatory Visit: Payer: Self-pay | Admitting: *Deleted

## 2022-01-24 ENCOUNTER — Telehealth: Payer: Self-pay

## 2022-01-24 DIAGNOSIS — R339 Retention of urine, unspecified: Secondary | ICD-10-CM

## 2022-01-24 NOTE — Telephone Encounter (Signed)
New referral placed.

## 2022-01-24 NOTE — Telephone Encounter (Signed)
Incoming message from patient's brother on triage line requesting a call back from [place], [place].

## 2022-01-24 NOTE — Telephone Encounter (Signed)
Please advise 

## 2022-01-27 ENCOUNTER — Telehealth: Payer: Self-pay

## 2022-01-27 NOTE — Progress Notes (Signed)
[facility] Posting Form   Surgery Date/Time: Date: 03/10/2022  Surgeon: Dr. [name], MD  Surgery Location: Day Surgery  Inpt ( No  )   Outpt (Yes)   Obs ( No  )   Diagnosis: N40.1, N13.8 Benign Prostatic Hyperplasia with Urinary Obstruction  -CPT: 07121  Surgery: Channel Transurethral Resection of the Prostate  Stop Anticoagulations: N/A  Cardiac/Medical/Pulmonary Clearance needed: no  *Orders entered into EPIC  Date: 01/27/22   *Case booked in [place]  Date: 01/23/2022  *Notified pt of Surgery: Date: 01/23/2022  PRE-OP UA & CX: yes, will be obtained in clinic on 02/14/2022  *Placed into Prior Authorization Work Fabio Bering Date: 01/27/22   Assistant/laser/rep:No

## 2022-01-27 NOTE — Telephone Encounter (Signed)
I spoke with Mr. Philip [name] [facility] for patient). We have discussed possible surgery dates and Monday October 2nd, 2023 was agreed upon by all parties. Patient given information about surgery date, what to expect pre-operatively and post operatively.  We discussed that a Pre-Admission Testing office will be calling to set up the pre-op visit that will take place prior to surgery, and that these appointments are typically done over the phone with a Pre-Admissions RN.  Informed patient that our office will communicate any additional care to be provided after surgery. Patients questions or concerns were discussed during our call. Advised to call our office should there be any additional information, questions or concerns that arise. Patient verbalized understanding.   Patient's POA Philip [name]) and I discussed in detail about patients surgery and dates. Originally told a date by someone else in the office in which Dr. Erlene Quan no longer has any available time. Mr. Philip [name] became irate on the phone calling this CMA several unpleasant names and belittling constantly. I apologized several times, I did escalate this to our Office Manager Kallie Locks who talked with him the next day. I did inform the POA Mr. Philip [name] if he needed to call the office to discuss surgery he could speak with Lattie Haw as I will no longer allow him to talk to me in such a derogatory manner.

## 2022-02-04 ENCOUNTER — Ambulatory Visit: Payer: Medicare Other | Admitting: Internal Medicine

## 2022-02-04 ENCOUNTER — Other Ambulatory Visit: Payer: Medicare Other

## 2022-02-05 ENCOUNTER — Inpatient Hospital Stay: Payer: Medicare Other | Attending: Internal Medicine

## 2022-02-05 ENCOUNTER — Encounter: Payer: Self-pay | Admitting: Internal Medicine

## 2022-02-05 ENCOUNTER — Inpatient Hospital Stay (HOSPITAL_BASED_OUTPATIENT_CLINIC_OR_DEPARTMENT_OTHER): Payer: Medicare Other | Admitting: Internal Medicine

## 2022-02-05 DIAGNOSIS — C7951 Secondary malignant neoplasm of bone: Secondary | ICD-10-CM

## 2022-02-05 DIAGNOSIS — C61 Malignant neoplasm of prostate: Secondary | ICD-10-CM | POA: Insufficient documentation

## 2022-02-05 DIAGNOSIS — K224 Dyskinesia of esophagus: Secondary | ICD-10-CM | POA: Diagnosis not present

## 2022-02-05 DIAGNOSIS — G20 Parkinson's disease: Secondary | ICD-10-CM | POA: Diagnosis not present

## 2022-02-05 LAB — COMPREHENSIVE METABOLIC PANEL
ALT: 5 U/L (ref 0–44)
AST: 18 U/L (ref 15–41)
Albumin: 4.1 g/dL (ref 3.5–5.0)
Alkaline Phosphatase: 70 U/L (ref 38–126)
Anion gap: 6 (ref 5–15)
BUN: 27 mg/dL — ABNORMAL HIGH (ref 8–23)
CO2: 29 mmol/L (ref 22–32)
Calcium: 9.2 mg/dL (ref 8.9–10.3)
Chloride: 101 mmol/L (ref 98–111)
Creatinine, Ser: 1.32 mg/dL — ABNORMAL HIGH (ref 0.61–1.24)
GFR, Estimated: 57 mL/min — ABNORMAL LOW (ref >60)
Glucose, Bld: 90 mg/dL (ref 70–99)
Potassium: 4 mmol/L (ref 3.5–5.1)
Sodium: 136 mmol/L (ref 135–145)
Total Bilirubin: 0.4 mg/dL (ref 0.3–1.2)
Total Protein: 7 g/dL (ref 6.5–8.1)

## 2022-02-05 LAB — CBC WITH DIFFERENTIAL/PLATELET
Abs Immature Granulocytes: 0.02 10*3/uL (ref 0.00–0.07)
Basophils Absolute: 0.1 10*3/uL (ref 0.0–0.1)
Basophils Relative: 1 %
Eosinophils Absolute: 0.3 10*3/uL (ref 0.0–0.5)
Eosinophils Relative: 4 %
HCT: 36.1 % — ABNORMAL LOW (ref 39.0–52.0)
Hemoglobin: 12.1 g/dL — ABNORMAL LOW (ref 13.0–17.0)
Immature Granulocytes: 0 %
Lymphocytes Relative: 25 %
Lymphs Abs: 1.6 10*3/uL (ref 0.7–4.0)
MCH: 30.5 pg (ref 26.0–34.0)
MCHC: 33.5 g/dL (ref 30.0–36.0)
MCV: 90.9 fL (ref 80.0–100.0)
Monocytes Absolute: 0.6 10*3/uL (ref 0.1–1.0)
Monocytes Relative: 9 %
Neutro Abs: 4 10*3/uL (ref 1.7–7.7)
Neutrophils Relative %: 61 %
Platelets: 273 10*3/uL (ref 150–400)
RBC: 3.97 MIL/uL — ABNORMAL LOW (ref 4.22–5.81)
RDW: 13.5 % (ref 11.5–15.5)
WBC: 6.6 10*3/uL (ref 4.0–10.5)
nRBC: 0 % (ref 0.0–0.2)

## 2022-02-05 LAB — PSA: Prostatic Specific Antigen: 15.66 ng/mL — ABNORMAL HIGH (ref 0.00–4.00)

## 2022-02-05 NOTE — Progress Notes (Signed)
No concerns. 

## 2022-02-05 NOTE — Assessment & Plan Note (Addendum)
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  Proceed with Eligard 45 mg every 6 months.   # No obvious clinical progression of disease noted; PSA_ AUG 2023- 8.25. PSA from today is pending.  Patient declined Erleda.  Discussed the prognosis of metastatic prostate cancer at length.  Understands this is incurable.  #Indwelling Foley catheter-s/p evaluation with Dr. Erlene Quan.  Failed trial of voiding [JUNE 2023]- ?  Suprapubic catheter.  Awaiting appointment again in [place], 9th 2023.   # Dysphagia- [> 30 years-] ?  Esophageal spasms status post barium swallow.  STABLE;  Continue follow-up with GI.  #Parkinson's-[Dr.Potter]- on sinemet. STABLE.   #Social issues: Patient wanting to move back to [place].  Patient is aware of his social situation; but is having difficulty adjusting socially in [place].  Overall medically-his care could be continued in [place].  *Eligard '45mg'$ ; 5/17-2023  # DISPOSITION: # follow up in mid-NOV 2023- MD; labs- cbc/cmp;PSA; Eliagrd--  Dr.B

## 2022-02-05 NOTE — Progress Notes (Signed)
[facility] OFFICE PROGRESS NOTE  Patient Care Team: Jerrol Banana., MD as PCP - General (Family Medicine) [name], MD as Consulting Physician (Oncology)   Cancer Staging  No matching staging information was found for the patient.   Oncology History Overview Note  # DEC 2022 [X-mas- found s/p fall; [place]; PSA 962; s/p Bx of [name][Dr.[name]]; s/p ADT in [place]; FEB 2023- 62 [Dr.[name]]  # n Christmas Day 2022 he fell and was no found for a couple of days and was found to be dehydrated and hypothermic.  Patient was admitted to the ICU in [facility].  During hospitalization patient was found to have elevated PSA -  962 and mri showed extensive sclerosis in pelvic, sacral, spine, and scapula, prostate cancer metastatic to bone. Creatinine was 2.94 without normalization. He had severe sepsis and bacteruria.   At discharge patient was sent over to rehab.  Given the family in [place]/brother-patient moved to [place].  Prior to this episode patient was losing weight.  However patient had been very healthy prior to this episode.  He was living alone.  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  Esophagus spasms.   Prostate cancer metastatic to bone ([name])  08/14/2021 Initial Diagnosis   Prostate cancer metastatic to bone ([place])      HISTORY OF PRESENT ILLNESS: Patient in a wheelchair.  Patient lives in assisted living; accompanied by his brother.  Philip [name] 76 y.o.  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  Patient currently undergoing evaluation with urology for supra pubic catheter placement.  Plan for October 9th.  Otherwise has not had any hospitalizations.  Denies any worsening joint pains or bone pain.  Denies any hot flashes.  Patient has been released from physical therapy.  He cannot use his a walker; mostly for his Foley catheter  bag.  However gaining weight.  Appetite is improving.  Also recently diagnosed with Parkinson's disease; status post neurology evaluation; Dr. Melrose Nakayama  Review of Systems  Constitutional:  Positive for malaise/fatigue and weight loss. Negative for chills, diaphoresis and fever.  HENT:  Negative for nosebleeds and sore throat.   Eyes:  Negative for double vision.  Respiratory:  Negative for cough, hemoptysis, sputum production, shortness of breath and wheezing.   Cardiovascular:  Negative for chest pain, palpitations, orthopnea and leg swelling.  Gastrointestinal:  Negative for abdominal pain, blood in stool, constipation, diarrhea, heartburn, melena, nausea and vomiting.  Genitourinary:  Negative for dysuria, frequency and urgency.  Musculoskeletal:  Negative for back pain and joint pain.  Skin: Negative.  Negative for itching and rash.  Neurological:  Positive for weakness. Negative for dizziness, tingling, focal weakness and headaches.  Endo/Heme/Allergies:  Does not bruise/bleed easily.  Psychiatric/Behavioral:  Negative for depression. The patient is not nervous/anxious and does not have insomnia.       PAST MEDICAL HISTORY :  Past Medical History:  Diagnosis Date   Arthritis    Asthma    Cancer ([place])    Parkinson disease ([place])    Prostate cancer ([place])     PAST SURGICAL HISTORY :   Past Surgical History:  Procedure Laterality Date   KNEE ARTHROSCOPY Right 2012   also lt knee unsure of date    FAMILY HISTORY :   Family History  Problem Relation Age of Onset   Arthritis/Rheumatoid Mother    Stroke Father    Hemangiomas  Brother     SOCIAL HISTORY:   Social History   Tobacco Use   Smoking status: Never   Smokeless tobacco: Never  Vaping Use   Vaping Use: Never used  Substance Use Topics   Alcohol use: Yes   Drug use: Never    ALLERGIES:  has No Known Allergies.  MEDICATIONS:  Current Outpatient Medications  Medication Sig Dispense Refill   acetaminophen  (TYLENOL) 325 MG tablet Take by mouth.     amitriptyline (ELAVIL) 10 MG tablet Take 1 tablet by mouth at bedtime.     carbidopa-levodopa (SINEMET IR) 25-100 MG tablet Take 1 tablet by mouth 3 (three) times daily. (Patient taking differently: Take 2 tablets by mouth 3 (three) times daily.) 90 tablet 3   Degarelix Acetate (FIRMAGON [place]) Inject into the skin.     omeprazole (PRILOSEC) 20 MG capsule Take 1 capsule (20 mg total) by mouth every morning. 90 capsule 3   No current facility-administered medications for this visit.    PHYSICAL EXAMINATION: ECOG PERFORMANCE STATUS: 2 - Symptomatic, <50% confined to bed  BP 132/71 (BP Location: Left Arm, Patient Position: Sitting, Cuff Size: Normal)   Pulse 71   Temp (!) 97.1 F (36.2 C) (Tympanic)   Ht '5\' 9"'$  (1.753 m)   Wt 137 lb 12.8 oz (62.5 kg)   SpO2 100%   BMI 20.35 kg/m   Filed Weights   02/05/22 1453  Weight: 137 lb 12.8 oz (62.5 kg)    Physical Exam Vitals and nursing note reviewed.  HENT:     Head: Normocephalic and atraumatic.     Mouth/Throat:     Pharynx: Oropharynx is clear.  Eyes:     Extraocular Movements: Extraocular movements intact.     Pupils: Pupils are equal, round, and reactive to light.  Cardiovascular:     Rate and Rhythm: Normal rate and regular rhythm.  Pulmonary:     Comments: Decreased breath sounds bilaterally.  Abdominal:     Palpations: Abdomen is soft.  Musculoskeletal:        General: Normal range of motion.     Cervical back: Normal range of motion.  Skin:    General: Skin is warm.  Neurological:     General: No focal deficit present.     Mental Status: He is alert and oriented to person, place, and time.  Psychiatric:        Behavior: Behavior normal.        Judgment: Judgment normal.     LABORATORY DATA:  I have reviewed the data as listed    Component Value Date/Time   NA 136 02/05/2022 1438   NA 140 07/31/2021 1612   K 4.0 02/05/2022 1438   CL 101 02/05/2022 1438   CO2 29  02/05/2022 1438   GLUCOSE 90 02/05/2022 1438   BUN 27 (H) 02/05/2022 1438   BUN 25 07/31/2021 1612   CREATININE 1.32 (H) 02/05/2022 1438   CALCIUM 9.2 02/05/2022 1438   PROT 7.0 02/05/2022 1438   PROT 6.5 07/31/2021 1612   ALBUMIN 4.1 02/05/2022 1438   ALBUMIN 4.3 07/31/2021 1612   AST 18 02/05/2022 1438   ALT 5 02/05/2022 1438   ALKPHOS 70 02/05/2022 1438   BILITOT 0.4 02/05/2022 1438   BILITOT 0.2 07/31/2021 1612   GFRNONAA 57 (L) 02/05/2022 1438    No results found for: "SPEP", "UPEP"  Lab Results  Component Value Date   WBC 6.6 02/05/2022   NEUTROABS 4.0 02/05/2022   HGB 12.1 (  L) 02/05/2022   HCT 36.1 (L) 02/05/2022   MCV 90.9 02/05/2022   PLT 273 02/05/2022      Chemistry      Component Value Date/Time   NA 136 02/05/2022 1438   NA 140 07/31/2021 1612   K 4.0 02/05/2022 1438   CL 101 02/05/2022 1438   CO2 29 02/05/2022 1438   BUN 27 (H) 02/05/2022 1438   BUN 25 07/31/2021 1612   CREATININE 1.32 (H) 02/05/2022 1438      Component Value Date/Time   CALCIUM 9.2 02/05/2022 1438   ALKPHOS 70 02/05/2022 1438   AST 18 02/05/2022 1438   ALT 5 02/05/2022 1438   BILITOT 0.4 02/05/2022 1438   BILITOT 0.2 07/31/2021 1612       RADIOGRAPHIC STUDIES: I have personally reviewed the radiological images as listed and agreed with the findings in the report. No results found.   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  Proceed with Eligard 45 mg every 6 months.   # No obvious clinical progression of disease noted; PSA_ AUG 2023- 8.25. PSA from today is pending.  Patient declined Erleda.  Discussed the prognosis of metastatic prostate cancer at length.  Understands this is incurable.  #Indwelling Foley catheter-s/p evaluation with Dr. Erlene Quan.  Failed trial of voiding [JUNE 2023]- ?  Suprapubic catheter.  Awaiting appointment  again in [place], 9th 2023.   # Dysphagia- [> 30 years-] ?  Esophageal spasms status post barium swallow.  STABLE;  Continue follow-up with GI.  #Parkinson's-[Dr.Potter]- on sinemet. STABLE.   #Social issues: Patient wanting to move back to [place].  Patient is aware of his social situation; but is having difficulty adjusting socially in [place].  Overall medically-his care could be continued in [place].  *Eligard '45mg'$ ; 5/17-2023  # DISPOSITION: # follow up in mid-NOV 2023- MD; labs- cbc/cmp;PSA; Eliagrd--  Dr.B      Orders Placed This Encounter  Procedures   CBC with Differential/Platelet    Standing Status:   Future    Standing Expiration Date:   02/06/2023   Comprehensive metabolic panel    Standing Status:   Future    Standing Expiration Date:   02/06/2023   PSA    Standing Status:   Future    Standing Expiration Date:   02/06/2023   All questions were answered. The patient knows to call the clinic with any problems, questions or concerns.      [name], MD 02/05/2022 4:15 PM

## 2022-02-06 ENCOUNTER — Other Ambulatory Visit (HOSPITAL_COMMUNITY): Payer: Self-pay

## 2022-02-07 ENCOUNTER — Ambulatory Visit (INDEPENDENT_AMBULATORY_CARE_PROVIDER_SITE_OTHER): Payer: Medicare Other | Admitting: Physician Assistant

## 2022-02-07 ENCOUNTER — Telehealth: Payer: Self-pay | Admitting: Internal Medicine

## 2022-02-07 ENCOUNTER — Other Ambulatory Visit: Payer: Self-pay

## 2022-02-07 ENCOUNTER — Encounter: Payer: Self-pay | Admitting: Physician Assistant

## 2022-02-07 DIAGNOSIS — Z466 Encounter for fitting and adjustment of urinary device: Secondary | ICD-10-CM | POA: Diagnosis not present

## 2022-02-07 DIAGNOSIS — R339 Retention of urine, unspecified: Secondary | ICD-10-CM

## 2022-02-07 DIAGNOSIS — C7951 Secondary malignant neoplasm of bone: Secondary | ICD-10-CM

## 2022-02-07 NOTE — Telephone Encounter (Signed)
Labs are in

## 2022-02-07 NOTE — Progress Notes (Signed)
Spoke to patient's brother regarding rising PSA 15 previously 8.  Discussed the concern for progressive disease.  However I would recommend repeating PSA/follow-up in 6 weeks or so.  Also recommend having the labs done 2 or 3 days before.  Patient's brother will check with the patient and call us to confirm the above.  Otherwise keep appointment as planned in 3 months.

## 2022-02-07 NOTE — Telephone Encounter (Signed)
Pt brother called and stated they needed to make an appt for 6 weeks from today. I just want to verify that is correct?

## 2022-02-07 NOTE — Progress Notes (Signed)
Cath Change/ Replacement  Patient is present today for a catheter change due to urinary retention.  24m of water was removed from the balloon, a 16FR foley cath was removed without difficulty.  Patient was cleaned and prepped in a sterile fashion with betadine and 2% lidocaine jelly was instilled into the urethra. A 16 FR foley cath was replaced into the bladder no complications were noted Urine return was noted 51mand urine was yellow in color. The balloon was filled with 1056mf sterile water. A night bag was attached for drainage.  Patient tolerated well.    Performed by: SamDebroah LoopA-C and RebVerlene MayerMA  Follow up: Return in about 4 weeks (around 03/07/2022) for Catheter exchange.

## 2022-02-14 ENCOUNTER — Ambulatory Visit: Payer: Medicare Other | Admitting: Physician Assistant

## 2022-02-18 ENCOUNTER — Telehealth: Payer: Self-pay

## 2022-02-18 NOTE — Telephone Encounter (Signed)
Copied from [place] [id number]. Topic: General - Inquiry >> Feb 18, 2022  9:54 AM Leilani Able wrote: Reason for CRM: Pt brother Philip [name] is calling re letter that he received on behalf of brother's care being transferred from [place]. He is on the [facility]. He wants more information than is available on One[employer] or [place] about Dr Quentin Cornwall. He is wanting to know her speciality, her training, how long she has been in practice and her experience. He feels disappointed that we are replacing Dr Rosanna Randy and expect them to just take a dr and [place] not have at our fingertips all of her experience and qualifications, practice time she has had as a dr. Frances Furbish fu to respond Jenelle Mages [phone]

## 2022-02-25 NOTE — Telephone Encounter (Signed)
Called to speak with the brother Jeneen Rinks about transfer of care however got VM.  If he would like to set up a consultation meeting with her he is welcome to do so.

## 2022-02-27 ENCOUNTER — Telehealth: Payer: Self-pay | Admitting: *Deleted

## 2022-02-27 NOTE — Telephone Encounter (Signed)
I received a message from [place] for patient reporting that patient does want to continue taking the medicine he receives in the mail. I see that on office note dated 02/05/22 he had declined the [place]. I discussed with Gavin Pound, pharmacist and she said Dr B will have to decide regarding this and a new application will need to be signed.

## 2022-02-28 ENCOUNTER — Encounter
Admission: RE | Admit: 2022-02-28 | Discharge: 2022-02-28 | Disposition: A | Discharge status: Home / Self Care | Payer: Medicare Other | Source: Ambulatory Visit | Attending: Urology | Admitting: Urology

## 2022-02-28 ENCOUNTER — Telehealth: Payer: Self-pay | Admitting: Pharmacist

## 2022-02-28 VITALS — Ht 68.0 in | Wt 135.0 lb

## 2022-02-28 DIAGNOSIS — I21A1 Myocardial infarction type 2: Secondary | ICD-10-CM

## 2022-02-28 DIAGNOSIS — R531 Weakness: Secondary | ICD-10-CM

## 2022-02-28 HISTORY — DX: Acute kidney failure, unspecified: N17.9

## 2022-02-28 HISTORY — DX: Rhabdomyolysis: M62.82

## 2022-02-28 HISTORY — DX: Dysphagia, unspecified: R13.10

## 2022-02-28 NOTE — Patient Instructions (Signed)
Your procedure is scheduled on: 03/17/22 Report to [place]. To find out your arrival time please call [phone] between 1PM - 3PM on 03/14/22.  Remember: Instructions that are not followed completely may result in serious medical risk, up to and including death, or upon the discretion of your surgeon and anesthesiologist your surgery may need to be rescheduled.     _X__ 1. Do not eat food or drink any liquids after midnight after midnight the night before your procedure.                 No gum chewing or hard candies.   __X__2.  On the morning of surgery brush your teeth with toothpaste and water, you                 may rinse your mouth with mouthwash if you wish.  Do not swallow any              toothpaste of mouthwash.     _X__ 3.  No Alcohol for 24 hours before or after surgery.   _X__ 4.  Do Not Smoke or use e-cigarettes For 24 Hours Prior to Your Surgery.                 Do not use any chewable tobacco products for at least 6 hours prior to                 surgery.  ____  5.  Bring all medications with you on the day of surgery if instructed.   __X__  6.  Notify your doctor if there is any change in your medical condition      (cold, fever, infections).     Do not wear jewelry, make-up, hairpins, clips or nail polish. Do not wear lotions, powders, or perfumes.  Do not shave body hair 48 hours prior to surgery. Men may shave face and neck. Do not bring valuables to the hospital.    [facility] is not responsible for any belongings or valuables.  Contacts, dentures/partials or body piercings may not be worn into surgery. Bring a case for your contacts, glasses or hearing aids, a denture cup will be supplied. Leave your suitcase in the car. After surgery it may be brought to your room. For patients admitted to the hospital, discharge time is determined by your treatment team.   Patients discharged the day of surgery  will not be allowed to drive home.    __X__ Take these medicines the morning of surgery with A SIP OF WATER ONLY: hold medications if patient is on thickened liquids   1. omeprazole (PRILOSEC) 20 MG capsule  2. carbidopa-levodopa (SINEMET IR) 25-100 MG tablet  3.   4.  5.  6.  ____ Fleet Enema (as directed)   ____ Use CHG Soap/SAGE wipes as directed  ____ Use inhalers on the day of surgery  ____ Stop metformin/Janumet/Farxiga 2 days prior to surgery    ____ Take 1/2 of usual insulin dose the night before surgery. No insulin the morning          of surgery.   ____ Stop Blood Thinners Coumadin/Plavix/Xarelto/Pleta/Pradaxa/Eliquis/Effient/Aspirin  on   Or contact your Surgeon, Cardiologist or Medical Doctor regarding  ability to stop your blood thinners  __X__ Stop Anti-inflammatories 7 days before surgery such as Advil, Ibuprofen, Motrin,  BC or Goodies Powder, Naprosyn, Naproxen, Aleve, Aspirin    __X__ Stop all herbals  and supplements, fish oil or vitamins for 7 days until after surgery.    ____ Bring C-Pap to the hospital.

## 2022-02-28 NOTE — Telephone Encounter (Addendum)
Spoke with patient's brother Jeneen Rinks this morning. He stated that his brother Philip [name] as not taking the [name] currently but wanted to keep filling the medication so he has plenty on hand to take if he "turned for the worse".   I told Jeneen Rinks for medication safety reasons, we would not send in refills for a patient for a medication they were not currently taking. If he does decide to start the [name] in the future, he already has a one month supply on hand from when he was going to originally start treatment but decided to postpone.   Jeneen Rinks knows that we would re-evaluate medication access for [name] if/when [name] decides to start taking the medication.

## 2022-02-28 NOTE — Telephone Encounter (Signed)
Spoke with patient's brother Philip [name] this morning. He stated that his brother Philip [name] as not taking the [name] currently but wanted to keep filling the medication so he has plenty on hand to take if he "turned for the worse".    I told Philip [name] for medication safety reasons, we would not send in refills for a patient for a medication they were not currently taking. If he does decide to start the [name] in the future, he already has a one month supply on hand from when he was going to originally start treatment but decided to postpone.    Philip [name] knows that we would re-evaluate medication access for [name] if/when [name] decides to start taking the medication.

## 2022-02-28 NOTE — Telephone Encounter (Signed)
Jeneen Rinks, pt brother called, he stated he was returning your call to discuss the pt's medications.

## 2022-03-07 ENCOUNTER — Ambulatory Visit (INDEPENDENT_AMBULATORY_CARE_PROVIDER_SITE_OTHER): Payer: Medicare Other | Admitting: Physician Assistant

## 2022-03-07 ENCOUNTER — Encounter
Admission: RE | Admit: 2022-03-07 | Discharge: 2022-03-07 | Disposition: A | Discharge status: Home / Self Care | Payer: Medicare Other | Source: Ambulatory Visit | Attending: Urology | Admitting: Urology

## 2022-03-07 VITALS — BP 124/68 | HR 75 | Ht 68.0 in | Wt 135.0 lb

## 2022-03-07 DIAGNOSIS — R339 Retention of urine, unspecified: Secondary | ICD-10-CM

## 2022-03-07 DIAGNOSIS — R531 Weakness: Secondary | ICD-10-CM

## 2022-03-07 DIAGNOSIS — I21A1 Myocardial infarction type 2: Secondary | ICD-10-CM | POA: Diagnosis not present

## 2022-03-07 LAB — MICROSCOPIC EXAMINATION: WBC, UA: >30 /hpf — AB (ref 0–5)

## 2022-03-07 LAB — URINALYSIS, COMPLETE
Bilirubin, UA: NEGATIVE
Glucose, UA: NEGATIVE
Nitrite, UA: NEGATIVE
Specific Gravity, UA: 1.025 (ref 1.005–1.030)
Urobilinogen, Ur: 1 mg/dL (ref 0.2–1.0)
pH, UA: 7.5 (ref 5.0–7.5)

## 2022-03-07 NOTE — Progress Notes (Signed)
Cath Change/ Replacement  Patient is present today for a catheter change due to urinary retention.  57m of water was removed from the balloon, a 16FR foley cath was removed without difficulty.  Patient was cleaned and prepped in a sterile fashion with betadine and 2% lidocaine jelly was instilled into the urethra. A 16 FR coude foley cath was replaced into the bladder, no complications were noted. Urine return was noted 176mand urine was yellow in color. The balloon was filled with 1076mf sterile water. A urine sample was obtained for preop UA and culture and a night bag was attached for drainage.  Patient tolerated well.    Performed by: SamDebroah LoopA-C and JesGaspar ColaMA  Follow up: TURP with Dr. BraErlene Quan/9

## 2022-03-11 LAB — CULTURE, URINE COMPREHENSIVE

## 2022-03-12 ENCOUNTER — Telehealth: Payer: Self-pay | Admitting: *Deleted

## 2022-03-12 MED ORDER — SULFAMETHOXAZOLE-TRIMETHOPRIM 800-160 MG PO TABS: ORAL_TABLET | ORAL | 0 refills | Status: DC

## 2022-03-12 NOTE — Telephone Encounter (Signed)
Spoke with brother (POA) and advised results. rx sent to pharmacy by e-script, [facility] Order faxed to [facility] with Dr.Brandon to verify the dosage, prescription sent to pharmacy

## 2022-03-12 NOTE — Telephone Encounter (Signed)
-----   Message from [place] sent at 03/11/2022  4:21 PM EDT ----- Please send in Bactrim DS BID x3 days, to be started 1 day prior to upcoming surgery.

## 2022-03-12 NOTE — Telephone Encounter (Signed)
.  left message to have patient return my call.  

## 2022-03-13 ENCOUNTER — Other Ambulatory Visit: Payer: Medicare Other | Admitting: Physician Assistant

## 2022-03-13 ENCOUNTER — Ambulatory Visit: Payer: Medicare Other | Admitting: Physician Assistant

## 2022-03-17 ENCOUNTER — Encounter: Payer: Self-pay | Admitting: Urology

## 2022-03-17 ENCOUNTER — Ambulatory Visit: Payer: Medicare Other | Admitting: Certified Registered"

## 2022-03-17 ENCOUNTER — Other Ambulatory Visit: Payer: Self-pay

## 2022-03-17 ENCOUNTER — Encounter
Admission: RE | Disposition: A | Discharge status: Home / Self Care | Payer: Self-pay | Source: Home / Self Care | Attending: Urology

## 2022-03-17 ENCOUNTER — Ambulatory Visit
Admission: RE | Admit: 2022-03-17 | Discharge: 2022-03-17 | Disposition: A | Discharge status: Home / Self Care | Payer: Medicare Other | Attending: Urology | Admitting: Urology

## 2022-03-17 DIAGNOSIS — R339 Retention of urine, unspecified: Secondary | ICD-10-CM | POA: Diagnosis not present

## 2022-03-17 DIAGNOSIS — N138 Other obstructive and reflux uropathy: Secondary | ICD-10-CM

## 2022-03-17 DIAGNOSIS — I2510 Atherosclerotic heart disease of native coronary artery without angina pectoris: Secondary | ICD-10-CM | POA: Diagnosis not present

## 2022-03-17 DIAGNOSIS — C61 Malignant neoplasm of prostate: Secondary | ICD-10-CM | POA: Diagnosis present

## 2022-03-17 DIAGNOSIS — I252 Old myocardial infarction: Secondary | ICD-10-CM | POA: Diagnosis not present

## 2022-03-17 DIAGNOSIS — G20A1 Parkinson's disease without dyskinesia, without mention of fluctuations: Secondary | ICD-10-CM | POA: Diagnosis not present

## 2022-03-17 DIAGNOSIS — R338 Other retention of urine: Secondary | ICD-10-CM | POA: Insufficient documentation

## 2022-03-17 DIAGNOSIS — C7951 Secondary malignant neoplasm of bone: Secondary | ICD-10-CM

## 2022-03-17 HISTORY — PX: TRANSURETHRAL RESECTION OF PROSTATE: SHX73

## 2022-03-17 SURGERY — TURP (TRANSURETHRAL RESECTION OF PROSTATE)
Anesthesia: General | Site: Prostate

## 2022-03-17 MED ORDER — FENTANYL CITRATE (PF) 100 MCG/2ML IJ SOLN
INTRAMUSCULAR | Status: DC | PRN
  Administered 2022-03-17 (×2): 50 ug via INTRAVENOUS

## 2022-03-17 MED ORDER — SUGAMMADEX SODIUM 200 MG/2ML IV SOLN
INTRAVENOUS | Status: DC | PRN
  Administered 2022-03-17: 200 mg via INTRAVENOUS

## 2022-03-17 MED ORDER — ROCURONIUM BROMIDE 100 MG/10ML IV SOLN
INTRAVENOUS | Status: DC | PRN
  Administered 2022-03-17: 40 mg via INTRAVENOUS

## 2022-03-17 MED ORDER — OXYCODONE HCL 5 MG/5ML PO SOLN: 5.0000 mg | Freq: Once | ORAL | Status: DC | PRN

## 2022-03-17 MED ORDER — PROPOFOL 1000 MG/100ML IV EMUL
INTRAVENOUS | Status: AC
  Filled 2022-03-17: qty 100

## 2022-03-17 MED ORDER — CEFAZOLIN SODIUM-DEXTROSE 1-4 GM/50ML-% IV SOLN
1.0000 g | INTRAVENOUS | Status: AC
  Administered 2022-03-17: 1 g via INTRAVENOUS

## 2022-03-17 MED ORDER — CHLORHEXIDINE GLUCONATE 0.12 % MT SOLN
OROMUCOSAL | Status: AC
  Filled 2022-03-17: qty 15

## 2022-03-17 MED ORDER — SODIUM CHLORIDE 0.9 % IR SOLN
Status: DC | PRN
  Administered 2022-03-17: 24000 mL

## 2022-03-17 MED ORDER — OXYCODONE HCL 5 MG PO TABS: 5.0000 mg | ORAL_TABLET | Freq: Once | ORAL | Status: DC | PRN

## 2022-03-17 MED ORDER — PROPOFOL 10 MG/ML IV BOLUS
INTRAVENOUS | Status: DC | PRN
  Administered 2022-03-17: 50 mg via INTRAVENOUS
  Administered 2022-03-17: 100 mg via INTRAVENOUS

## 2022-03-17 MED ORDER — ONDANSETRON HCL 4 MG/2ML IJ SOLN
INTRAMUSCULAR | Status: DC | PRN
  Administered 2022-03-17: 4 mg via INTRAVENOUS

## 2022-03-17 MED ORDER — LACTATED RINGERS IV SOLN: INTRAVENOUS | Status: DC

## 2022-03-17 MED ORDER — LIDOCAINE HCL (CARDIAC) PF 100 MG/5ML IV SOSY
PREFILLED_SYRINGE | INTRAVENOUS | Status: DC | PRN
  Administered 2022-03-17: 50 mg via INTRAVENOUS

## 2022-03-17 MED ORDER — LIDOCAINE HCL (PF) 2 % IJ SOLN
INTRAMUSCULAR | Status: AC
  Filled 2022-03-17: qty 5

## 2022-03-17 MED ORDER — FENTANYL CITRATE (PF) 100 MCG/2ML IJ SOLN
INTRAMUSCULAR | Status: AC
  Filled 2022-03-17: qty 2

## 2022-03-17 MED ORDER — GLYCOPYRROLATE 0.2 MG/ML IJ SOLN
INTRAMUSCULAR | Status: AC
  Filled 2022-03-17: qty 1

## 2022-03-17 MED ORDER — DEXAMETHASONE SODIUM PHOSPHATE 10 MG/ML IJ SOLN
INTRAMUSCULAR | Status: DC | PRN
  Administered 2022-03-17: 10 mg via INTRAVENOUS

## 2022-03-17 MED ORDER — ROCURONIUM BROMIDE 10 MG/ML (PF) SYRINGE
PREFILLED_SYRINGE | INTRAVENOUS | Status: AC
  Filled 2022-03-17: qty 10

## 2022-03-17 MED ORDER — ORAL CARE MOUTH RINSE: 15.0000 mL | Freq: Once | OROMUCOSAL | Status: AC

## 2022-03-17 MED ORDER — HYDROCODONE-ACETAMINOPHEN 5-325 MG PO TABS: 1.0000 | ORAL_TABLET | Freq: Four times a day (QID) | ORAL | 0 refills | Status: DC | PRN

## 2022-03-17 MED ORDER — CEFAZOLIN SODIUM-DEXTROSE 1-4 GM/50ML-% IV SOLN
INTRAVENOUS | Status: AC
  Filled 2022-03-17: qty 50

## 2022-03-17 MED ORDER — ONDANSETRON HCL 4 MG/2ML IJ SOLN
INTRAMUSCULAR | Status: AC
  Filled 2022-03-17: qty 2

## 2022-03-17 MED ORDER — PHENYLEPHRINE HCL (PRESSORS) 10 MG/ML IV SOLN
INTRAVENOUS | Status: DC | PRN
  Administered 2022-03-17: 160 ug via INTRAVENOUS
  Administered 2022-03-17: 80 ug via INTRAVENOUS

## 2022-03-17 MED ORDER — FENTANYL CITRATE (PF) 100 MCG/2ML IJ SOLN: 25.0000 ug | INTRAMUSCULAR | Status: DC | PRN

## 2022-03-17 MED ORDER — GLYCOPYRROLATE 0.2 MG/ML IJ SOLN
INTRAMUSCULAR | Status: DC | PRN
  Administered 2022-03-17: .2 mg via INTRAVENOUS

## 2022-03-17 MED ORDER — OXYBUTYNIN CHLORIDE 5 MG PO TABS: 5.0000 mg | ORAL_TABLET | Freq: Three times a day (TID) | ORAL | 0 refills | Status: DC | PRN

## 2022-03-17 MED ORDER — DEXAMETHASONE SODIUM PHOSPHATE 10 MG/ML IJ SOLN
INTRAMUSCULAR | Status: AC
  Filled 2022-03-17: qty 1

## 2022-03-17 MED ORDER — CHLORHEXIDINE GLUCONATE 0.12 % MT SOLN
15.0000 mL | Freq: Once | OROMUCOSAL | Status: AC
  Administered 2022-03-17: 15 mL via OROMUCOSAL

## 2022-03-17 SURGICAL SUPPLY — 21 items
ADAPTER IRRIG TUBE 2 SPIKE SOL (ADAPTER) ×2 IMPLANT
ADPR TBG 2 SPK PMP STRL ASCP (ADAPTER) ×1
BAG DRAIN SIEMENS DORNER NS (MISCELLANEOUS) ×1 IMPLANT
BAG DRN LRG CPC RND TRDRP CNTR (MISCELLANEOUS) ×1
BAG DRN NS LF (MISCELLANEOUS) ×1
BAG URO DRAIN 4000ML (MISCELLANEOUS) ×1 IMPLANT
CATH FOL 2WAY LX 20X30 (CATHETERS) IMPLANT
CATH FOL 2WAY LX 24X30 (CATHETERS) IMPLANT
GLOVE BIO SURGEON STRL SZ 6.5 (GLOVE) ×1 IMPLANT
GOWN STRL REUS W/ TWL LRG LVL3 (GOWN DISPOSABLE) ×2 IMPLANT
GOWN STRL REUS W/TWL LRG LVL3 (GOWN DISPOSABLE) ×2
HOLDER FOLEY CATH W/STRAP (MISCELLANEOUS) ×1 IMPLANT
IV NS IRRIG 3000ML ARTHROMATIC (IV SOLUTION) ×6 IMPLANT
KIT TURNOVER CYSTO (KITS) ×1 IMPLANT
LOOP CUT BIPOLAR 24F LRG (ELECTROSURGICAL) IMPLANT
PACK CYSTO AR (MISCELLANEOUS) ×1 IMPLANT
SET IRRIG Y TYPE TUR BLADDER L (SET/KITS/TRAYS/PACK) ×1 IMPLANT
SURGILUBE 2OZ TUBE FLIPTOP (MISCELLANEOUS) ×1 IMPLANT
SYR TOOMEY IRRIG 70ML (MISCELLANEOUS) ×1
SYRINGE TOOMEY IRRIG 70ML (MISCELLANEOUS) ×1 IMPLANT
WATER STERILE IRR 500ML POUR (IV SOLUTION) ×1 IMPLANT

## 2022-03-17 NOTE — H&P (Signed)
03/17/22  RRR CTAB  Risk of bleeding, infection, incontinence, failure, retrograde ejaculation, damage to surrounding structures discussed.    On abx to reduce bacterial load in setting of chronic indwelling foley.  Philip [name] 1946-05-01 [id number]   CC:    Chief Complaint  Patient presents with   Results    HPI: Philip [name] is a 76 y.o. male with PMH static prostate cancer managed by Dr. Rogue [name] on ADT and urinary retention managed with indwelling Foley catheter with end-stage appearing bladder on cystoscopy who presents today to discuss urodynamics results.  He is accompanied today by his brother, who contributes to HPI.   He underwent urodynamics on 01/07/2022.  Foley catheter was exchanged at that time.  During testing, he had bladder sensation and was able to generate an unstable voluntary contraction.  Voided volume was 39 mL, max flow 2 mL/s.  Detrusor peak pressure was 54.  Max detrusor pressure was 63.  Overall, this was felt to be consistent with an obstructive urinary pattern despite low volume of spontaneous void.   Today he reports he wishes to try everything possible to get out of retention and is interested in pursuing surgery if possible.  His brother's wife has an upcoming knee replacement procedure and they would like to time his surgery so that these 2 procedures do not overlap so that his brother can care for both patients postoperatively.   PMH:     Past Medical History:  Diagnosis Date   Arthritis     Asthma     Cancer ([place])     Parkinson disease ([place])     Prostate cancer Philip [name])        Surgical History:      Past Surgical History:  Procedure Laterality Date   KNEE ARTHROSCOPY Right 2012    also lt knee unsure of date      Home Medications:  Allergies as of 01/15/2022   No Known Allergies         Medication List           Accurate as of January 15, 2022 11:59 PM. If you have any questions, ask your nurse or doctor.               acetaminophen 325 MG tablet Commonly known as: TYLENOL Take by mouth.    amitriptyline 10 MG tablet Commonly known as: ELAVIL Take 1 tablet by mouth at bedtime.    carbidopa-levodopa 25-100 MG tablet Commonly known as: SINEMET IR Take 1 tablet by mouth 3 (three) times daily. What changed: how much to take    [facility] Inject into the skin.    omeprazole 20 MG capsule Commonly known as: PRILOSEC Take 1 capsule (20 mg total) by mouth every morning.             Allergies:  No Known Allergies   Family History:      Family History  Problem Relation Age of Onset   Arthritis/Rheumatoid Mother     Stroke Father     Hemangiomas Brother        Social History:   reports that he has never smoked. He has never used smokeless tobacco. He reports current alcohol use. He reports that he does not use drugs.   Physical Exam: BP 119/70   Pulse 72   Wt 136 lb (61.7 kg)   BMI 20.08 kg/m   Constitutional:  Alert and oriented, no acute distress, nontoxic appearing HEENT: [place], AT Cardiovascular: No clubbing, cyanosis, or  edema Respiratory: Normal respiratory effort, no increased work of breathing Skin: No rashes, bruises or suspicious lesions Neurologic: Grossly intact, no focal deficits, moving all 4 extremities Psychiatric: Normal mood and affect   Assessment & Plan:   1. Urinary retention I reviewed the urodynamics results with Dr. Erlene Quan and then had a lengthy conversation with the patient and his brother in clinic today.  We discussed that while he was able to generate involuntary void, his overall void volume was rather low.  Overall, if he were to pursue the channel TURP that Dr. Erlene Quan recommends, there is an approximate 50% chance of him being able to spontaneously and sufficiently void on his own that he could come off the catheter.  Patient continues to be rather perseverant on the idea of pursuing HOLEP despite Dr. Cherrie Gauze recommendation for channel TURP.  We  discussed that her concern with HOLEP is his increased risk for significant urinary leakage.  We discussed that channel TURP is still an effective procedure and that recovery time would be approximately equivalent to a HOLEP and I do not anticipate an overnight [name] stay unless he has significant intraoperative bleeding.  Patient's brother wonders if they should get a second opinion but ultimately declines this.  After much consideration, they would like to proceed with a channel TURP with Dr. Erlene Quan.  Based on OR timing, we are targeting 02/24/2022.  As he is leaving clinic today, patient requested to be put on another surgeon schedule for his surgery that it could be performed sooner.  I stated that we do not typically do this, but would reach out to him once I spoke with Dr. Erlene Quan.   Return in about 4 weeks (around 02/12/2022) for Catheter change and preop UA/culture, channel TURP with Dr. Erlene Quan on 02/24/2022.   Philip Loop, PA-C   [name] [address] [place], [place] [address] [phone]

## 2022-03-17 NOTE — Anesthesia Procedure Notes (Signed)
Procedure Name: Intubation Date/Time: 03/17/2022 7:59 AM  Performed by: Rolla Plate, CRNAPre-anesthesia Checklist: Patient identified, Patient being monitored, Timeout performed, Emergency Drugs available and Suction available Patient Re-evaluated:Patient Re-evaluated prior to induction Oxygen Delivery Method: Circle system utilized Preoxygenation: Pre-oxygenation with 100% oxygen Induction Type: IV induction Ventilation: Mask ventilation without difficulty Laryngoscope Size: Mac, McGraph and 4 Grade View: Grade I Tube type: Oral Tube size: 7.5 mm Number of attempts: 1 Airway Equipment and Method: Stylet Placement Confirmation: ETT inserted through vocal cords under direct vision, positive ETCO2 and breath sounds checked- equal and bilateral Secured at: 22 cm Tube secured with: Tape Dental Injury: Teeth and Oropharynx as per pre-operative assessment

## 2022-03-17 NOTE — Anesthesia Postprocedure Evaluation (Signed)
Anesthesia Post Note  Patient: Philip [name]  Procedure(s) Performed: TRANSURETHRAL RESECTION OF THE PROSTATE (TURP) (Prostate)  Patient location during evaluation: PACU Anesthesia Type: General Level of consciousness: awake and alert Pain management: pain level controlled Vital Signs Assessment: post-procedure vital signs reviewed and stable Respiratory status: spontaneous breathing, nonlabored ventilation, respiratory function stable and patient connected to nasal cannula oxygen Cardiovascular status: blood pressure returned to baseline and stable Postop Assessment: no apparent nausea or vomiting Anesthetic complications: no   No notable events documented.   Last Vitals:  Vitals:   03/17/22 0930 03/17/22 0950  BP: (!) 149/83 (!) 162/78  Pulse: 84 93  Resp: 14 14  Temp:  36.7 C  SpO2: 97% 98%    Last Pain:  Vitals:   03/17/22 0950  TempSrc: Temporal  PainSc: 0-No pain                 [name]

## 2022-03-17 NOTE — Anesthesia Preprocedure Evaluation (Signed)
Anesthesia Evaluation  Patient identified by MRN, date of birth, ID band Patient awake    Reviewed: Allergy & Precautions, NPO status , Patient's Chart, lab work & pertinent test results  History of Anesthesia Complications Negative for: history of anesthetic complications  Airway Mallampati: III  TM Distance: <3 FB Neck ROM: full    Dental  (+) Chipped   Pulmonary neg shortness of breath, asthma ,    Pulmonary exam normal        Cardiovascular Exercise Tolerance: Good (-) angina+ CAD and + Past MI  Normal cardiovascular exam     Neuro/Psych  Neuromuscular disease negative psych ROS   GI/Hepatic negative GI ROS, Neg liver ROS,   Endo/Other  negative endocrine ROS  Renal/GU Renal disease     Musculoskeletal   Abdominal   Peds  Hematology negative hematology ROS (+)   Anesthesia Other Findings Past Medical History: No date: Acute kidney failure (HCC) No date: Arthritis No date: Asthma No date: Cancer ([place]) No date: Dysphagia No date: Parkinson disease No date: Prostate cancer ([place]) No date: Rhabdomyolysis  Past Surgical History: No date: CARDIAC CATHETERIZATION 2012: KNEE ARTHROSCOPY; Right     Comment:  also lt knee unsure of date     Reproductive/Obstetrics negative OB ROS                             Anesthesia Physical Anesthesia Plan  ASA: 3  Anesthesia Plan: General ETT   Post-op Pain Management:    Induction: Intravenous  PONV Risk Score and Plan: Ondansetron, Dexamethasone, Midazolam and Treatment may vary due to age or medical condition  Airway Management Planned: Oral ETT  Additional Equipment:   Intra-op Plan:   Post-operative Plan: Extubation in OR  Informed Consent: I have reviewed the patients History and Physical, chart, labs and discussed the procedure including the risks, benefits and alternatives for the proposed anesthesia with the patient or  authorized representative who has indicated his/her understanding and acceptance.     Dental Advisory Given  Plan Discussed with: Anesthesiologist, CRNA and Surgeon  Anesthesia Plan Comments: (Patient consented for risks of anesthesia including but not limited to:  - adverse reactions to medications - damage to eyes, teeth, lips or other oral mucosa - nerve damage due to positioning  - sore throat or hoarseness - Damage to heart, brain, nerves, lungs, other parts of body or loss of life  Patient voiced understanding.)        Anesthesia Quick Evaluation

## 2022-03-17 NOTE — Op Note (Signed)
Date of procedure: 03/17/22  Preoperative diagnosis:  Prostate cancer with urinary retention  Postoperative diagnosis:  Same as above  Procedure: TURP  Surgeon: [name], MD  Anesthesia: General  Complications: None  Intraoperative findings: Bilobar coaptation with heavily trabeculated bladder with saccules, mild catheter cystitis  EBL: Minimal  Specimens: Prostate chips  Drains: 20 French two-way Foley catheter with 30 cc balloon  Indication: Philip [name] is a 76 y.o. patient with advanced metastatic prostate cancer on ADT who is failed multiple voiding trials.  Urodynamics consistent with obstruction.  He has other COVID factors including personal history of Parkinson's.  After reviewing the management options for treatment, he elected to proceed with the above surgical procedure(s). We have discussed the potential benefits and risks of the procedure, side effects of the proposed treatment, the likelihood of the patient achieving the goals of the procedure, and any potential problems that might occur during the procedure or recuperation. Informed consent has been obtained.  Description of procedure:  The patient was taken to the operating room and general anesthesia was induced.  The patient was placed in the dorsal lithotomy position, prepped and draped in the usual sterile fashion, and preoperative antibiotics were administered. A preoperative time-out was performed.   A 26 French blunt angled resectoscope was advanced per urethra into the bladder.  Careful inspection of the bladder revealed some very mild catheter cystitis in the posterior bladder wall, a few saccules and widemouth diverticula as well as heavy bladder trabeculation.  There is bilobar coaptation of the prostate without significant median lobe component.  There is very mild intravesical protrusion.  At this point time, using saline as the medium and a bipolar loop, began taking down the right lateral lobe.   This was done in a piece wise fashion.  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  I then took down the left lateral lobe in a similar fashion.  Care was taken not to resect past the verumontanum.  I also wanted to create a widely open patent channel but the same time, given his personal history of Parkinson's, left small amount of apical tissue in order to reduce the risk of urinary incontinence which is relatively high.  I then took down a small anterior lobe.  Careful and adequate hemostasis was achieved using the bipolar.  Finally, a Toomey syringe was used to evacuate all chips from the bladder and the prostatic fossa.  Hemostasis appeared to be adequate.  No significant active bleeding was noted with the bipolar off.  I then remove the scope and replaced a 20 French Foley catheter which went easily.  The wound was filled with 30 cc of water.  The catheter was irrigated which irrigated without difficulty and the urine was only pink-tinged.  He was then cleaned and dried, repositioned in supine position, reversed of anesthesia, taken to the PACU in stable condition.  Plan: We will have him return next week for voiding trial.  He will complete his periprocedural antibiotics.  [name], M.D.

## 2022-03-17 NOTE — OR Nursing (Signed)
60F Foley Catheter removed, 10 mL of water in syringe. Removed before procedure.

## 2022-03-17 NOTE — Discharge Instructions (Signed)
AMBULATORY SURGERY  ?DISCHARGE INSTRUCTIONS ? ? ?The drugs that you were given will stay in your system until tomorrow so for the next 24 hours you should not: ? ?Drive an automobile ?Make any legal decisions ?Drink any alcoholic beverage ? ? ?You may resume regular meals tomorrow.  Today it is better to start with liquids and gradually work up to solid foods. ? ?You may eat anything you prefer, but it is better to start with liquids, then soup and crackers, and gradually work up to solid foods. ? ? ?Please notify your doctor immediately if you have any unusual bleeding, trouble breathing, redness and pain at the surgery site, drainage, fever, or pain not relieved by medication. ? ? ? ?Additional Instructions: ? ? ? ?Please contact your physician with any problems or Same Day Surgery at [phone], Monday through Friday 6 am to 4 pm, or [place] [facility] [place] Main number at [phone].  ?

## 2022-03-17 NOTE — Transfer of Care (Signed)
Immediate Anesthesia Transfer of Care Note  Patient: Philip [name]  Procedure(s) Performed: TRANSURETHRAL RESECTION OF THE PROSTATE (TURP) (Prostate)  Patient Location: PACU  Anesthesia Type:General  Level of Consciousness: sedated  Airway & Oxygen Therapy: Patient Spontanous Breathing and Patient connected to face mask oxygen  Post-op Assessment: Report given to RN and Post -op Vital signs reviewed and stable  Post vital signs: Reviewed  Last Vitals:  Vitals Value Taken Time  BP    Temp    Pulse 64 03/17/22 0900  Resp 13 03/17/22 0900  SpO2 99 % 03/17/22 0900  Vitals shown include unvalidated device data.  Last Pain:  Vitals:   03/17/22 0647  TempSrc: Temporal  PainSc: 0-No pain         Complications: No notable events documented.

## 2022-03-18 ENCOUNTER — Encounter: Payer: Self-pay | Admitting: Urology

## 2022-03-18 ENCOUNTER — Other Ambulatory Visit: Payer: Medicare Other

## 2022-03-19 ENCOUNTER — Ambulatory Visit: Payer: Medicare Other | Admitting: Urology

## 2022-03-19 LAB — SURGICAL PATHOLOGY

## 2022-03-20 ENCOUNTER — Telehealth: Payer: Self-pay | Admitting: *Deleted

## 2022-03-20 NOTE — Telephone Encounter (Signed)
Agree with advice  [name], MD

## 2022-03-20 NOTE — Telephone Encounter (Signed)
Pt brother calling stating that he had TURP on Monday and now he's having some leakage around the tip of the penis, it's a little bloody . I advised that's normal but I would ask just to be sure. Brother asking if they are ok to wait until Tuesday to remove cath? I did state he should leave it in until Tuesday.  Please advise

## 2022-03-21 ENCOUNTER — Ambulatory Visit: Payer: Medicare Other | Admitting: Internal Medicine

## 2022-03-25 ENCOUNTER — Encounter: Payer: Self-pay | Admitting: Physician Assistant

## 2022-03-25 ENCOUNTER — Ambulatory Visit: Payer: Medicare Other | Admitting: Family Medicine

## 2022-03-25 ENCOUNTER — Ambulatory Visit (INDEPENDENT_AMBULATORY_CARE_PROVIDER_SITE_OTHER): Payer: Medicare Other | Admitting: Physician Assistant

## 2022-03-25 ENCOUNTER — Ambulatory Visit: Payer: Medicare Other | Admitting: Physician Assistant

## 2022-03-25 DIAGNOSIS — R339 Retention of urine, unspecified: Secondary | ICD-10-CM

## 2022-03-25 LAB — BLADDER SCAN AMB NON-IMAGING: Scan Result: 21

## 2022-03-25 MED ORDER — SULFAMETHOXAZOLE-TRIMETHOPRIM 800-160 MG PO TABS
1.0000 | ORAL_TABLET | Freq: Once | ORAL | Status: AC
  Administered 2022-03-25: 1 via ORAL

## 2022-03-25 NOTE — Progress Notes (Signed)
Afternoon follow-up  Patient returned to clinic this afternoon for repeat PVR. He reports drinking approximately 30oz of fluid. He has been able to urinate. He has had urinary leakage. PVR 65m.  Results for orders placed or performed in visit on 03/25/22  Bladder Scan (Post Void Residual) in office  Result Value Ref Range   Scan Result 21     Voiding trial passed. Counseled patient on normal postoperative findings including dysuria, gross hematuria, and urinary urgency/leakage. Counseled patient to wear absorbent products as needed for security. Written and verbal resources provided today.  NOTE: 1 dose Bactrim DS PO administered prior to Foley removal this morning.  Follow up: Return in about 6 weeks (around 05/06/2022) for Postop f/u with Dr. BErlene Quanwith IPSS/PVR.

## 2022-03-25 NOTE — Patient Instructions (Addendum)
Congratulations on your recent TURP procedure! As discussed in clinic today, these are the three normal postoperative findings after this surgery: Burning or pain with urination: This typically resolves within 1 week of surgery. If you are still having significant pain with urination 10 days after surgery, please call our clinic. We may need to check you for a urinary tract infection at that point, though this is rare. Blood in the urine: This may either come and go or steadily improve before going away, but typically resolves completely within 3 weeks of surgery. If you are on blood thinners, it may take longer for the bleeding to resolve. Please note that you may find that you pass clumps of tissue or debris around 10-14 days after surgery associated with some new bleeding. This is due to sloughing of your postoperative scab and is also normal. If at any point you start to pass dark red urine; thick, ketchup-like urine; or large blood clots around the size of your palm, please call our office immediately. Urinary leakage or urgency: This tends to improve with time, with most patients becoming dry within around 3 months of surgery. You may wear absorbant underwear or liners for security during this time.

## 2022-03-25 NOTE — Progress Notes (Signed)
Catheter Removal  Patient is present today for a catheter removal.  30m of water was drained from the balloon. A 20FR foley cath was removed from the bladder, no complications were noted. Patient tolerated well.  Performed by: TBreck Coons

## 2022-03-31 ENCOUNTER — Inpatient Hospital Stay: Payer: Medicare Other | Attending: Internal Medicine

## 2022-03-31 DIAGNOSIS — C7951 Secondary malignant neoplasm of bone: Secondary | ICD-10-CM | POA: Insufficient documentation

## 2022-03-31 DIAGNOSIS — G20A1 Parkinson's disease without dyskinesia, without mention of fluctuations: Secondary | ICD-10-CM | POA: Insufficient documentation

## 2022-03-31 DIAGNOSIS — C61 Malignant neoplasm of prostate: Secondary | ICD-10-CM | POA: Diagnosis present

## 2022-03-31 DIAGNOSIS — N139 Obstructive and reflux uropathy, unspecified: Secondary | ICD-10-CM | POA: Insufficient documentation

## 2022-03-31 LAB — COMPREHENSIVE METABOLIC PANEL
ALT: 7 U/L (ref 0–44)
AST: 17 U/L (ref 15–41)
Albumin: 3.8 g/dL (ref 3.5–5.0)
Alkaline Phosphatase: 76 U/L (ref 38–126)
Anion gap: 8 (ref 5–15)
BUN: 22 mg/dL (ref 8–23)
CO2: 29 mmol/L (ref 22–32)
Calcium: 8.9 mg/dL (ref 8.9–10.3)
Chloride: 102 mmol/L (ref 98–111)
Creatinine, Ser: 1.1 mg/dL (ref 0.61–1.24)
GFR, Estimated: >60 mL/min (ref >60)
Glucose, Bld: 113 mg/dL — ABNORMAL HIGH (ref 70–99)
Potassium: 4 mmol/L (ref 3.5–5.1)
Sodium: 139 mmol/L (ref 135–145)
Total Bilirubin: 0.3 mg/dL (ref 0.3–1.2)
Total Protein: 7.3 g/dL (ref 6.5–8.1)

## 2022-03-31 LAB — CBC WITH DIFFERENTIAL/PLATELET
Abs Immature Granulocytes: 0.02 10*3/uL (ref 0.00–0.07)
Basophils Absolute: 0.1 10*3/uL (ref 0.0–0.1)
Basophils Relative: 1 %
Eosinophils Absolute: 0.1 10*3/uL (ref 0.0–0.5)
Eosinophils Relative: 1 %
HCT: 36.8 % — ABNORMAL LOW (ref 39.0–52.0)
Hemoglobin: 12.2 g/dL — ABNORMAL LOW (ref 13.0–17.0)
Immature Granulocytes: 0 %
Lymphocytes Relative: 23 %
Lymphs Abs: 1.4 10*3/uL (ref 0.7–4.0)
MCH: 29.8 pg (ref 26.0–34.0)
MCHC: 33.2 g/dL (ref 30.0–36.0)
MCV: 89.8 fL (ref 80.0–100.0)
Monocytes Absolute: 0.6 10*3/uL (ref 0.1–1.0)
Monocytes Relative: 10 %
Neutro Abs: 3.9 10*3/uL (ref 1.7–7.7)
Neutrophils Relative %: 65 %
Platelets: 284 10*3/uL (ref 150–400)
RBC: 4.1 MIL/uL — ABNORMAL LOW (ref 4.22–5.81)
RDW: 13.2 % (ref 11.5–15.5)
WBC: 6.1 10*3/uL (ref 4.0–10.5)
nRBC: 0 % (ref 0.0–0.2)

## 2022-03-31 LAB — PSA: Prostatic Specific Antigen: 35.24 ng/mL — ABNORMAL HIGH (ref 0.00–4.00)

## 2022-04-02 ENCOUNTER — Inpatient Hospital Stay (HOSPITAL_BASED_OUTPATIENT_CLINIC_OR_DEPARTMENT_OTHER): Payer: Medicare Other | Admitting: Internal Medicine

## 2022-04-02 ENCOUNTER — Encounter: Payer: Self-pay | Admitting: Internal Medicine

## 2022-04-02 DIAGNOSIS — C61 Malignant neoplasm of prostate: Secondary | ICD-10-CM | POA: Diagnosis not present

## 2022-04-02 DIAGNOSIS — C7951 Secondary malignant neoplasm of bone: Secondary | ICD-10-CM

## 2022-04-02 NOTE — Assessment & Plan Note (Addendum)
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  Proceed with Eligard 45 mg every 6 months.   # No obvious clinical progression of disease noted; However PSA is rising-October 2023-35.  Patient declined Erleda.  Discussed the prognosis of metastatic prostate cancer at length. Discussed scan CT CAP/bone scan; however pt wants to wait until after his shot/in NOV 2023. Continues to decline Erleda.  Understands this is incurable.   #Urinary obstruction -s/p TURP [OCT 2023; Dr.Brandon]- STABLE.   # Dysphagia- [> 30 years-] ?  Esophageal spasms status post barium swallow. STABLE.   Continue follow-up with GI.  #Parkinson's-[Dr.Potter]- on sinemet. STABLE.   # Vaccination: s/p Flu shot; recommend COVID/RSV. Pt reluctant.   *Eligard '45mg'$ ; 5/17-2023  # DISPOSITION: # Keep appts as planned for Nov 15th-Dr.B

## 2022-04-02 NOTE — Progress Notes (Signed)
Philip [name] OFFICE PROGRESS NOTE  Patient Care Team: Jerrol Banana., MD as PCP - General (Family Medicine) [name], MD as Consulting Physician (Oncology)   Cancer Staging  No matching staging information was found for the patient.   Oncology History Overview Note  # DEC 2022 [X-mas- found s/p fall; [place]; PSA 962; s/p Bx of [name][Dr.[name]]; [place]/p ADT in [place]; FEB 2023- 62 [Dr.[name]]  # # OCT 2023-castrate resistant prostate cancer  # n Christmas Day 2022 he fell and was no found for a couple of days and was found to be dehydrated and hypothermic.  Patient was admitted to the ICU in [place].  During hospitalization patient was found to have elevated PSA -  962 and mri showed extensive sclerosis in pelvic, sacral, spine, and scapula, prostate cancer metastatic to bone. Creatinine was 2.94 without normalization. He had severe sepsis and bacteruria.   At discharge patient was sent over to rehab.  Given the family in [place]/brother-patient moved to [place].  Prior to this episode patient was losing weight.  However patient had been very healthy prior to this episode.  He was living alone.  Patient has chronic difficulty swallowing since age of 46.?  Esophagus spasms.     Prostate cancer metastatic to bone ([place])  08/14/2021 Initial Diagnosis   Prostate cancer metastatic to bone ([place])      HISTORY OF PRESENT ILLNESS: Patient in a rolling walker.   Patient lives in assisted living; accompanied by his brother.  Philip [name] 76 y.o.  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  Patient currently undergoing evaluation with urology - s/p TURP October 9th. currently  Otherwise has not had any hospitalizations.  Denies any worsening joint pains or bone pain.  Denies any hot flashes.  Patient has been released from physical therapy.  However gaining weight.   Appetite is improving.  Also recently diagnosed with Parkinson's disease; status post neurology evaluation; Dr. Melrose Nakayama  Review of Systems  Constitutional:  Positive for malaise/fatigue and weight loss. Negative for chills, diaphoresis and fever.  HENT:  Negative for nosebleeds and sore throat.   Eyes:  Negative for double vision.  Respiratory:  Negative for cough, hemoptysis, sputum production, shortness of breath and wheezing.   Cardiovascular:  Negative for chest pain, palpitations, orthopnea and leg swelling.  Gastrointestinal:  Negative for abdominal pain, blood in stool, constipation, diarrhea, heartburn, melena, nausea and vomiting.  Genitourinary:  Negative for dysuria, frequency and urgency.  Musculoskeletal:  Negative for back pain and joint pain.  Skin: Negative.  Negative for itching and rash.  Neurological:  Positive for weakness. Negative for dizziness, tingling, focal weakness and headaches.  Endo/Heme/Allergies:  Does not bruise/bleed easily.  Psychiatric/Behavioral:  Negative for depression. The patient is not nervous/anxious and does not have insomnia.       PAST MEDICAL HISTORY :  Past Medical History:  Diagnosis Date   Acute kidney failure ([place])    Arthritis    Asthma    Cancer ([place])    Dysphagia    Parkinson disease    Prostate cancer ([place])    Rhabdomyolysis     PAST SURGICAL HISTORY :   Past Surgical History:  Procedure Laterality Date   CARDIAC CATHETERIZATION     KNEE ARTHROSCOPY Right 2012   also lt knee unsure of date   TRANSURETHRAL RESECTION OF PROSTATE N/A 03/17/2022   Procedure: TRANSURETHRAL RESECTION OF  THE PROSTATE (TURP);  Surgeon: [name], MD;  Location: [facility] ORS;  Service: Urology;  Laterality: N/A;    FAMILY HISTORY :   Family History  Problem Relation Age of Onset   Arthritis/Rheumatoid Mother    Stroke Father    Hemangiomas Brother     SOCIAL HISTORY:   Social History   Tobacco Use   Smoking status: Never   Smokeless  tobacco: Never  Vaping Use   Vaping Use: Never used  Substance Use Topics   Alcohol use: Yes   Drug use: Never    ALLERGIES:  has No Known Allergies.  MEDICATIONS:  Current Outpatient Medications  Medication Sig Dispense Refill   acetaminophen (TYLENOL) 325 MG tablet Take 650 mg by mouth every 6 (six) hours as needed.     carbidopa-levodopa (SINEMET IR) 25-100 MG tablet Take 1 tablet by mouth 3 (three) times daily. 90 tablet 3   Degarelix Acetate (FIRMAGON [place]) Inject into the skin.     Leuprolide Acetate (ELIGARD [place]) Inject into the skin.     omeprazole (PRILOSEC) 20 MG capsule Take 1 capsule (20 mg total) by mouth every morning. 90 capsule 3   HYDROcodone-acetaminophen (NORCO/VICODIN) 5-325 MG tablet Take 1-2 tablets by mouth every 6 (six) hours as needed for moderate pain. (Patient not taking: Reported on 04/02/2022) 10 tablet 0   oxybutynin (DITROPAN) 5 MG tablet Take 1 tablet (5 mg total) by mouth every 8 (eight) hours as needed for bladder spasms. (Patient not taking: Reported on 04/02/2022) 10 tablet 0   No current facility-administered medications for this visit.    PHYSICAL EXAMINATION: ECOG PERFORMANCE STATUS: 2 - Symptomatic, <50% confined to bed  BP 119/68 (BP Location: Left Arm, Patient Position: Sitting)   Pulse 81   Temp (!) 97.3 F (36.3 C) (Tympanic)   Resp 14   Wt 133 lb 12.8 oz (60.7 kg)   SpO2 98%   BMI 20.34 kg/m   Filed Weights   04/02/22 0957  Weight: 133 lb 12.8 oz (60.7 kg)    Physical Exam Vitals and nursing note reviewed.  HENT:     Head: Normocephalic and atraumatic.     Mouth/Throat:     Pharynx: Oropharynx is clear.  Eyes:     Extraocular Movements: Extraocular movements intact.     Pupils: Pupils are equal, round, and reactive to light.  Cardiovascular:     Rate and Rhythm: Normal rate and regular rhythm.  Pulmonary:     Comments: Decreased breath sounds bilaterally.  Abdominal:     Palpations: Abdomen is soft.  Musculoskeletal:         General: Normal range of motion.     Cervical back: Normal range of motion.  Skin:    General: Skin is warm.  Neurological:     General: No focal deficit present.     Mental Status: He is alert and oriented to person, place, and time.  Psychiatric:        Behavior: Behavior normal.        Judgment: Judgment normal.     LABORATORY DATA:  I have reviewed the data as listed    Component Value Date/Time   NA 139 03/31/2022 1127   NA 140 07/31/2021 1612   K 4.0 03/31/2022 1127   CL 102 03/31/2022 1127   CO2 29 03/31/2022 1127   GLUCOSE 113 (H) 03/31/2022 1127   BUN 22 03/31/2022 1127   BUN 25 07/31/2021 1612   CREATININE 1.10 03/31/2022 1127  CALCIUM 8.9 03/31/2022 1127   PROT 7.3 03/31/2022 1127   PROT 6.5 07/31/2021 1612   ALBUMIN 3.8 03/31/2022 1127   ALBUMIN 4.3 07/31/2021 1612   AST 17 03/31/2022 1127   ALT 7 03/31/2022 1127   ALKPHOS 76 03/31/2022 1127   BILITOT 0.3 03/31/2022 1127   BILITOT 0.2 07/31/2021 1612   GFRNONAA >60 03/31/2022 1127    No results found for: "SPEP", "UPEP"  Lab Results  Component Value Date   WBC 6.1 03/31/2022   NEUTROABS 3.9 03/31/2022   HGB 12.2 (L) 03/31/2022   HCT 36.8 (L) 03/31/2022   MCV 89.8 03/31/2022   PLT 284 03/31/2022      Chemistry      Component Value Date/Time   NA 139 03/31/2022 1127   NA 140 07/31/2021 1612   K 4.0 03/31/2022 1127   CL 102 03/31/2022 1127   CO2 29 03/31/2022 1127   BUN 22 03/31/2022 1127   BUN 25 07/31/2021 1612   CREATININE 1.10 03/31/2022 1127      Component Value Date/Time   CALCIUM 8.9 03/31/2022 1127   ALKPHOS 76 03/31/2022 1127   AST 17 03/31/2022 1127   ALT 7 03/31/2022 1127   BILITOT 0.3 03/31/2022 1127   BILITOT 0.2 07/31/2021 1612       RADIOGRAPHIC STUDIES: I have personally reviewed the radiological images as listed and agreed with the findings in the report. No results found.   ASSESSMENT & PLAN:  Prostate cancer metastatic to bone [facility]) #De novo metastatic  castrate resistant prostate cancer [Dx- DEC 2022;CO]-s/p Firmagon; July 23 2021-Eligardq 83M [CO].  Proceed with Eligard 45 mg every 6 months.   # No obvious clinical progression of disease noted; However PSA is rising-October 2023-35.  Patient declined Erleda.  Discussed the prognosis of metastatic prostate cancer at length. Discussed scan CT CAP/bone scan; however pt wants to wait until after his shot/in NOV 2023. Continues to decline Erleda.  Understands this is incurable.   #Urinary obstruction -s/p TURP [OCT 2023; Dr.Brandon]- STABLE.   # Dysphagia- [> 30 years-] ?  Esophageal spasms status post barium swallow. STABLE.   Continue follow-up with GI.  #Parkinson's-[Dr.Potter]- on sinemet. STABLE.   # Vaccination: s/p Flu shot; recommend COVID/RSV. Pt reluctant.   *Eligard '45mg'$ ; 5/17-2023  # DISPOSITION: # Keep appts as planned for Nov 15th-Dr.B        No orders of the defined types were placed in this encounter.  All questions were answered. The patient knows to call the clinic with any problems, questions or concerns.      [name], MD 04/02/2022 11:00 AM

## 2022-04-02 NOTE — Progress Notes (Signed)
Pt and brother in for follow up. Pt reports had urinary catheter removed last week, states has had increased frequency.

## 2022-04-23 ENCOUNTER — Ambulatory Visit: Payer: Medicare Other | Admitting: Internal Medicine

## 2022-04-23 ENCOUNTER — Ambulatory Visit: Payer: Medicare Other

## 2022-04-23 ENCOUNTER — Other Ambulatory Visit: Payer: Medicare Other

## 2022-04-24 ENCOUNTER — Encounter: Payer: Self-pay | Admitting: Internal Medicine

## 2022-04-24 ENCOUNTER — Inpatient Hospital Stay: Payer: Medicare Other | Attending: Internal Medicine

## 2022-04-24 ENCOUNTER — Inpatient Hospital Stay (HOSPITAL_BASED_OUTPATIENT_CLINIC_OR_DEPARTMENT_OTHER): Payer: Medicare Other | Admitting: Internal Medicine

## 2022-04-24 ENCOUNTER — Inpatient Hospital Stay: Payer: Medicare Other

## 2022-04-24 VITALS — BP 130/64 | HR 78 | Temp 97.6°F | Resp 18 | Wt 135.0 lb

## 2022-04-24 DIAGNOSIS — K5900 Constipation, unspecified: Secondary | ICD-10-CM | POA: Diagnosis not present

## 2022-04-24 DIAGNOSIS — C7951 Secondary malignant neoplasm of bone: Secondary | ICD-10-CM | POA: Diagnosis not present

## 2022-04-24 DIAGNOSIS — K224 Dyskinesia of esophagus: Secondary | ICD-10-CM | POA: Diagnosis not present

## 2022-04-24 DIAGNOSIS — C61 Malignant neoplasm of prostate: Secondary | ICD-10-CM | POA: Insufficient documentation

## 2022-04-24 DIAGNOSIS — G20A1 Parkinson's disease without dyskinesia, without mention of fluctuations: Secondary | ICD-10-CM | POA: Insufficient documentation

## 2022-04-24 DIAGNOSIS — Z79899 Other long term (current) drug therapy: Secondary | ICD-10-CM | POA: Diagnosis not present

## 2022-04-24 DIAGNOSIS — R32 Unspecified urinary incontinence: Secondary | ICD-10-CM | POA: Diagnosis not present

## 2022-04-24 DIAGNOSIS — Z79818 Long term (current) use of other agents affecting estrogen receptors and estrogen levels: Secondary | ICD-10-CM | POA: Diagnosis not present

## 2022-04-24 LAB — COMPREHENSIVE METABOLIC PANEL
ALT: 5 U/L (ref 0–44)
AST: 20 U/L (ref 15–41)
Albumin: 4 g/dL (ref 3.5–5.0)
Alkaline Phosphatase: 98 U/L (ref 38–126)
Anion gap: 10 (ref 5–15)
BUN: 31 mg/dL — ABNORMAL HIGH (ref 8–23)
CO2: 26 mmol/L (ref 22–32)
Calcium: 8.9 mg/dL (ref 8.9–10.3)
Chloride: 102 mmol/L (ref 98–111)
Creatinine, Ser: 1.1 mg/dL (ref 0.61–1.24)
GFR, Estimated: >60 mL/min (ref >60)
Glucose, Bld: 138 mg/dL — ABNORMAL HIGH (ref 70–99)
Potassium: 4 mmol/L (ref 3.5–5.1)
Sodium: 138 mmol/L (ref 135–145)
Total Bilirubin: 0.3 mg/dL (ref 0.3–1.2)
Total Protein: 7.1 g/dL (ref 6.5–8.1)

## 2022-04-24 LAB — CBC WITH DIFFERENTIAL/PLATELET
Abs Immature Granulocytes: 0.02 10*3/uL (ref 0.00–0.07)
Basophils Absolute: 0.1 10*3/uL (ref 0.0–0.1)
Basophils Relative: 1 %
Eosinophils Absolute: 0.3 10*3/uL (ref 0.0–0.5)
Eosinophils Relative: 4 %
HCT: 37.1 % — ABNORMAL LOW (ref 39.0–52.0)
Hemoglobin: 12.2 g/dL — ABNORMAL LOW (ref 13.0–17.0)
Immature Granulocytes: 0 %
Lymphocytes Relative: 22 %
Lymphs Abs: 1.7 10*3/uL (ref 0.7–4.0)
MCH: 29.2 pg (ref 26.0–34.0)
MCHC: 32.9 g/dL (ref 30.0–36.0)
MCV: 88.8 fL (ref 80.0–100.0)
Monocytes Absolute: 0.7 10*3/uL (ref 0.1–1.0)
Monocytes Relative: 9 %
Neutro Abs: 5.1 10*3/uL (ref 1.7–7.7)
Neutrophils Relative %: 64 %
Platelets: 293 10*3/uL (ref 150–400)
RBC: 4.18 MIL/uL — ABNORMAL LOW (ref 4.22–5.81)
RDW: 14 % (ref 11.5–15.5)
WBC: 8 10*3/uL (ref 4.0–10.5)
nRBC: 0 % (ref 0.0–0.2)

## 2022-04-24 LAB — PSA: Prostatic Specific Antigen: 65.39 ng/mL — ABNORMAL HIGH (ref 0.00–4.00)

## 2022-04-24 MED ORDER — LEUPROLIDE ACETATE (6 MONTH) 45 MG ~~LOC~~ KIT
45.0000 mg | PACK | Freq: Once | SUBCUTANEOUS | Status: AC
  Administered 2022-04-24: 45 mg via SUBCUTANEOUS
  Filled 2022-04-24: qty 45

## 2022-04-24 NOTE — Progress Notes (Addendum)
**Philip Philip** Philip Philip  Patient Care Team: Jerrol Banana., MD as PCP - General (Family Medicine) [name], MD as Consulting Physician (Oncology)   Cancer Staging  No matching staging information was found for the patient.   Oncology History Overview Philip  # DEC 2022 [X-mas- found s/p fall; [place]; PSA 962; s/p Bx of [name][Dr.[name]]; [place]; FEB 2023- 62 [Dr.[name]]  # # OCT 2023-castrate resistant prostate cancer  # n Christmas Day 2022 he fell and was no found for a couple of days and was found to be dehydrated and hypothermic.  Patient was admitted to the ICU in [place].  During hospitalization patient was found to have elevated PSA -  962 and mri showed extensive sclerosis in pelvic, sacral, spine, and scapula, prostate cancer metastatic to bone. Creatinine was 2.94 without normalization. He had severe sepsis and bacteruria.   At discharge patient was sent over to rehab.  Given the family in [place]/brother-patient moved to [place].  Prior to this episode patient was losing weight.  However patient had been very healthy prior to this episode.  He was living alone.  Patient has chronic difficulty swallowing since age of 48.?  Esophagus spasms.     Prostate cancer metastatic to bone ([place])  08/14/2021 Initial Diagnosis   Prostate cancer metastatic to bone ([place])      HISTORY OF PRESENT ILLNESS: Patient in a rolling walker.   Patient lives in assisted living; accompanied by his brother.  Philip Philip 76 y.o.  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  Patient reports constipation for the past 2 to 3 years.  He has been having bowel movements but small.   Patient reports since he had TURP he he has been having episodes of urinary incontinence.  Review of Systems  Constitutional:  Positive for malaise/fatigue and weight loss. Negative  for chills, diaphoresis and fever.  HENT:  Negative for nosebleeds and sore throat.   Eyes:  Negative for double vision.  Respiratory:  Negative for cough, hemoptysis, sputum production, shortness of breath and wheezing.   Cardiovascular:  Negative for chest pain, palpitations, orthopnea and leg swelling.  Gastrointestinal:  Negative for abdominal pain, blood in stool, constipation, diarrhea, heartburn, melena, nausea and vomiting.  Genitourinary:  Negative for dysuria, frequency and urgency.  Musculoskeletal:  Negative for back pain and joint pain.  Skin: Negative.  Negative for itching and rash.  Neurological:  Positive for weakness. Negative for dizziness, tingling, focal weakness and headaches.  Endo/Heme/Allergies:  Does not bruise/bleed easily.  Psychiatric/Behavioral:  Negative for depression. The patient is not nervous/anxious and does not have insomnia.       PAST MEDICAL HISTORY :  Past Medical History:  Diagnosis Date   Acute kidney failure ([place])    Arthritis    Asthma    Cancer ([place])    Dysphagia    Parkinson disease    Prostate cancer ([place])    Rhabdomyolysis     PAST SURGICAL HISTORY :   Past Surgical History:  Procedure Laterality Date   CARDIAC CATHETERIZATION     KNEE ARTHROSCOPY Right 2012   also lt knee unsure of date   TRANSURETHRAL RESECTION OF PROSTATE N/A 03/17/2022   Procedure: TRANSURETHRAL RESECTION OF THE PROSTATE (TURP);  Surgeon: [name], MD;  Location: [facility] ORS;  Service: Urology;  Laterality: N/A;    FAMILY HISTORY :  Family History  Problem Relation Age of Onset   Arthritis/Rheumatoid Mother    Stroke Father    Hemangiomas Brother     SOCIAL HISTORY:   Social History   Tobacco Use   Smoking status: Never   Smokeless tobacco: Never  Vaping Use   Vaping Use: Never used  Substance Use Topics   Alcohol use: Yes   Drug use: Never    ALLERGIES:  has No Known Allergies.  MEDICATIONS:  Current Outpatient Medications   Medication Sig Dispense Refill   acetaminophen (TYLENOL) 325 MG tablet Take 650 mg by mouth every 6 (six) hours as needed.     carbidopa-levodopa (SINEMET IR) 25-100 MG tablet Take 1 tablet by mouth 3 (three) times daily. 90 tablet 3   Degarelix Acetate (FIRMAGON [place]) Inject into the skin.     HYDROcodone-acetaminophen (NORCO/VICODIN) 5-325 MG tablet Take 1-2 tablets by mouth every 6 (six) hours as needed for moderate pain. (Patient not taking: Reported on 04/02/2022) 10 tablet 0   Leuprolide Acetate (ELIGARD ) Inject into the skin.     omeprazole (PRILOSEC) 20 MG capsule Take 1 capsule (20 mg total) by mouth every morning. 90 capsule 3   oxybutynin (DITROPAN) 5 MG tablet Take 1 tablet (5 mg total) by mouth every 8 (eight) hours as needed for bladder spasms. (Patient not taking: Reported on 04/02/2022) 10 tablet 0   No current facility-administered medications for this visit.    PHYSICAL EXAMINATION: ECOG PERFORMANCE STATUS: 2 - Symptomatic, <50% confined to bed  There were no vitals taken for this visit.  There were no vitals filed for this visit.   Physical Exam Vitals and nursing Philip reviewed.  HENT:     Head: Normocephalic and atraumatic.     Mouth/Throat:     Pharynx: Oropharynx is clear.  Eyes:     Extraocular Movements: Extraocular movements intact.     Pupils: Pupils are equal, round, and reactive to light.  Cardiovascular:     Rate and Rhythm: Normal rate and regular rhythm.  Pulmonary:     Comments: Decreased breath sounds bilaterally.  Abdominal:     Palpations: Abdomen is soft.  Musculoskeletal:        General: Normal range of motion.     Cervical back: Normal range of motion.  Skin:    General: Skin is warm.  Neurological:     General: No focal deficit present.     Mental Status: He is alert and oriented to person, place, and time.  Psychiatric:        Behavior: Behavior normal.        Judgment: Judgment normal.    LABORATORY DATA:  I have reviewed  the data as listed    Component Value Date/Time   NA 139 03/31/2022 1127   NA 140 07/31/2021 1612   K 4.0 03/31/2022 1127   CL 102 03/31/2022 1127   CO2 29 03/31/2022 1127   GLUCOSE 113 (H) 03/31/2022 1127   BUN 22 03/31/2022 1127   BUN 25 07/31/2021 1612   CREATININE 1.10 03/31/2022 1127   CALCIUM 8.9 03/31/2022 1127   PROT 7.3 03/31/2022 1127   PROT 6.5 07/31/2021 1612   ALBUMIN 3.8 03/31/2022 1127   ALBUMIN 4.3 07/31/2021 1612   AST 17 03/31/2022 1127   ALT 7 03/31/2022 1127   ALKPHOS 76 03/31/2022 1127   BILITOT 0.3 03/31/2022 1127   BILITOT 0.2 07/31/2021 1612   GFRNONAA >60 03/31/2022 1127    No results found for: "  SPEP", "UPEP"  Lab Results  Component Value Date   WBC 6.1 03/31/2022   NEUTROABS 3.9 03/31/2022   HGB 12.2 (L) 03/31/2022   HCT 36.8 (L) 03/31/2022   MCV 89.8 03/31/2022   PLT 284 03/31/2022      Chemistry      Component Value Date/Time   NA 139 03/31/2022 1127   NA 140 07/31/2021 1612   K 4.0 03/31/2022 1127   CL 102 03/31/2022 1127   CO2 29 03/31/2022 1127   BUN 22 03/31/2022 1127   BUN 25 07/31/2021 1612   CREATININE 1.10 03/31/2022 1127      Component Value Date/Time   CALCIUM 8.9 03/31/2022 1127   ALKPHOS 76 03/31/2022 1127   AST 17 03/31/2022 1127   ALT 7 03/31/2022 1127   BILITOT 0.3 03/31/2022 1127   BILITOT 0.2 07/31/2021 1612       RADIOGRAPHIC STUDIES: I have personally reviewed the radiological images as listed and agreed with the findings in the report. No results found.   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  Dose today.   #Discussed with the patient and the brother that PSA has been going up..  Patient would like to wait for today's PSA to result.  If continues to rise, he is agreeable to get CT scans and is willing to start apalutamide.  He already has supply for 3  months.  I will discuss with the pharmacy to see if it can be crushed.  If PSA remains stable or declining and patient would like to continue with Eligard injections only.  I will call patient's brother as requested tomorrow and accordingly scheduled follow-up.     #Urinary obstruction -s/p TURP [OCT 2023; Dr.Brandon]-reports episodes of urinary incontinence since surgery.  Has follow-up with Dr. Erlene Quan end of this month.   # Dysphagia- [> 30 years-] ?  Esophageal spasms status post barium swallow. STABLE.   Continue follow-up with GI.   #Parkinson's-[Dr.Potter]- on sinemet. STABLE.    #Constipation-discussed about senna and MiraLAX as needed   *Eligard '45mg'$ ; 04/24/2022   # DISPOSITION: #We will schedule follow-up with Dr. B pending PSA.   Addendum -  PSA increased to 65.  I called the brother Jeneen Rinks and discussed about the results.  Would like to proceed with imaging and agreeable to start apalutamide.  Prescription order with instructions to administer was faxed to [facility] facility.  We will schedule follow-up with Dr. B in 4 weeks with repeat psa  No orders of the defined types were placed in this encounter.  All questions were answered. The patient knows to call the clinic with any problems, questions or concerns.      Jane Canary, MD 04/24/2022 1:34 PM

## 2022-04-24 NOTE — Progress Notes (Signed)
Patient here today for follow up regarding prostate cancer. Patient denies concerns today.

## 2022-04-25 ENCOUNTER — Other Ambulatory Visit: Payer: Self-pay | Admitting: *Deleted

## 2022-04-25 ENCOUNTER — Telehealth: Payer: Self-pay | Admitting: *Deleted

## 2022-04-25 ENCOUNTER — Telehealth: Payer: Self-pay | Admitting: Internal Medicine

## 2022-04-25 NOTE — Addendum Note (Signed)
Addended byJane Canary on: 04/25/2022 01:11 PM   Modules accepted: Orders

## 2022-04-25 NOTE — Telephone Encounter (Signed)
Patient brother and pharmacy called asking that the Senna ordered be in tablet form and not the liquid form

## 2022-04-25 NOTE — Telephone Encounter (Signed)
Patient brother Jeneen Rinks called stating that the letter/ note sent to [facility] regarding need for Miralax and senna is not sufficient and that they need an actual order with instructions. He requests we call them and clarify what is needed so that patient can get his medicine

## 2022-04-25 NOTE — Telephone Encounter (Signed)
Per chat- Dr. Loni Muse- 11/17-Please schedule f/u with Dr. B, labs in 4 weeks.   [facility] Schedule CT c/a/p and bone scan. thank you   Appointments scheduled

## 2022-04-28 ENCOUNTER — Telehealth: Payer: Self-pay | Admitting: *Deleted

## 2022-04-28 ENCOUNTER — Encounter: Payer: Self-pay | Admitting: Internal Medicine

## 2022-04-28 NOTE — Telephone Encounter (Signed)
Returned call and left voicemail for Philip [name] letting him know that we will start the renewal application for [id number]. I asked that he return my call to go over everything as well.

## 2022-04-28 NOTE — Telephone Encounter (Signed)
Jeneen Rinks called reporting that there is still a problem with patient medications, Can you please call him about his

## 2022-05-05 ENCOUNTER — Other Ambulatory Visit: Payer: Self-pay

## 2022-05-05 ENCOUNTER — Telehealth: Payer: Self-pay | Admitting: Internal Medicine

## 2022-05-05 NOTE — Telephone Encounter (Signed)
Patient would like to know if there has been a response to the medication request forms they filled out. Do you have any answers for them?

## 2022-05-05 NOTE — Telephone Encounter (Signed)
Fax received from [facility] for medication refill request.  LOV:10/23/2021 LR: 10/22/2021 Qty:90 R:3

## 2022-05-06 ENCOUNTER — Ambulatory Visit (INDEPENDENT_AMBULATORY_CARE_PROVIDER_SITE_OTHER): Payer: Medicare Other | Admitting: Urology

## 2022-05-06 ENCOUNTER — Encounter: Payer: Self-pay | Admitting: Urology

## 2022-05-06 VITALS — BP 158/84 | HR 87 | Ht 68.0 in | Wt 135.0 lb

## 2022-05-06 DIAGNOSIS — C7951 Secondary malignant neoplasm of bone: Secondary | ICD-10-CM

## 2022-05-06 DIAGNOSIS — C61 Malignant neoplasm of prostate: Secondary | ICD-10-CM

## 2022-05-06 DIAGNOSIS — R339 Retention of urine, unspecified: Secondary | ICD-10-CM

## 2022-05-06 DIAGNOSIS — N393 Stress incontinence (female) (male): Secondary | ICD-10-CM

## 2022-05-06 LAB — BLADDER SCAN AMB NON-IMAGING: Scan Result: 10

## 2022-05-06 NOTE — Progress Notes (Signed)
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  He underwent channel TURP after urodynamics indicated evidence of urinary obstruction.  He underwent a successful voiding trial the following week.  He returns today for postop follow-up.  Surgery occurred on 03/17/2022.  He reports today that he is extremely pleased that he does not have a catheter.  His urinary symptoms seem to be improving slowly.  He is getting up fewer times at night.  He does have some urinary incontinence, voids small amounts but this does not bother him.  IPSS as below.  He is wearing a diaper for safety.  In terms of prostate cancer, he is still on ADT.  He was recently started on [name] about a week and a half ago.  His PSA is rising.  He scheduled for restaging imaging in the near future.  Results for orders placed or performed in visit on 05/06/22  Bladder Scan (Post Void Residual) in office  Result Value Ref Range   Scan Result 10       IPSS     Row Name 05/06/22 1000         International Prostate Symptom Score   How often have you had the sensation of not emptying your bladder? Not at All     How often have you had to urinate less than every two hours? Less than 1 in 5 times     How often have you found you stopped and started again several times when you urinated? Less than 1 in 5 times     How often have you found it difficult to postpone urination? Less than half the time     How often have you had a weak urinary stream? Less than half the time     How often have you had to strain to start urination? Less than 1 in 5 times     How many times did you typically  get up at night to urinate? 3 Times     Total IPSS Score 10       Quality of Life due to urinary symptoms   If you were to spend the rest of your life with your urinary condition just the way it is now how would you feel about that? Mixed              Score:  1-7 Mild 8-19 Moderate 20-35 Severe    PMH: Past Medical History:  Diagnosis Date   Acute kidney failure ([place])    Arthritis    Asthma    Cancer ([place])    Dysphagia    Parkinson disease    Prostate cancer ([place])    Rhabdomyolysis     Surgical History: Past Surgical History:  Procedure Laterality Date   CARDIAC CATHETERIZATION     KNEE ARTHROSCOPY Right 2012   also lt knee unsure of date   TRANSURETHRAL RESECTION OF PROSTATE N/A 03/17/2022   Procedure: TRANSURETHRAL RESECTION OF THE PROSTATE (TURP);  Surgeon: [name], MD;  Location: [facility] ORS;  Service: Urology;  Laterality: N/A;    Home Medications:  Allergies as of 05/06/2022   No Known Allergies  Medication List        Accurate as of May 06, 2022 10:42 AM. If you have any questions, ask your nurse or doctor.          STOP taking these medications    HYDROcodone-acetaminophen 5-325 MG tablet Commonly known as: NORCO/VICODIN Stopped by: [name], MD   oxybutynin 5 MG tablet Commonly known as: DITROPAN Stopped by: [name], MD       TAKE these medications    acetaminophen 325 MG tablet Commonly known as: TYLENOL Take 650 mg by mouth every 6 (six) hours as needed.   carbidopa-levodopa 25-100 MG tablet Commonly known as: SINEMET IR Take 1 tablet by mouth 3 (three) times daily.   ELIGARD [place] Inject into the skin.   FIRMAGON [place] Inject into the skin.   omeprazole 20 MG capsule Commonly known as: PRILOSEC Take 1 capsule (20 mg total) by mouth every morning.        Allergies: No Known Allergies  Family History: Family History  Problem Relation Age of Onset   Arthritis/Rheumatoid Mother    Stroke  Father    Hemangiomas Brother     Social History:  reports that he has never smoked. He has never used smokeless tobacco. He reports current alcohol use. He reports that he does not use drugs.   Physical Exam: BP (!) 158/84   Pulse 87   Ht '5\' 8"'$  (1.727 m)   Wt 135 lb (61.2 kg)   BMI 20.53 kg/m   Constitutional:  Alert and oriented, No acute distress. HEENT: [place] AT, moist mucus membranes.  Trachea midline, no masses. Cardiovascular: No clubbing, cyanosis, or edema. Respiratory: Normal respiratory effort, no increased work of breathing. GI: Abdomen is soft, nontender, nondistended, no abdominal masses GU: No CVA tenderness Skin: No rashes, bruises or suspicious lesions. Neurologic: Grossly intact, no focal deficits, moving all 4 extremities. Psychiatric: Normal mood and affect.  Laboratory Data: Lab Results  Component Value Date   WBC 8.0 04/24/2022   HGB 12.2 (L) 04/24/2022   HCT 37.1 (L) 04/24/2022   MCV 88.8 04/24/2022   PLT 293 04/24/2022    Lab Results  Component Value Date   CREATININE 1.10 04/24/2022    Assessment & Plan:    1. Urinary retention Resolved status post channel TURP, now with adequate bladder emptying - Bladder Scan (Post Void Residual) in office  2. Prostate cancer metastatic to bone [facility]) Managed by cancer center, PSA is rising now on additional oral medications  3. 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  Overall, he is satisfied and as such, no further intervention planned.  Return in about 1 year (around 05/07/2023) for 1 year follow pvr ippps  with sam or shannon.  [name], MD  [facility] [address] [place], [place] [address] [phone]

## 2022-05-07 MED ORDER — CARBIDOPA-LEVODOPA 25-100 MG PO TABS: 1.0000 | ORAL_TABLET | Freq: Three times a day (TID) | ORAL | 3 refills | Status: AC

## 2022-05-08 ENCOUNTER — Telehealth: Payer: Self-pay

## 2022-05-08 ENCOUNTER — Other Ambulatory Visit (HOSPITAL_COMMUNITY): Payer: Self-pay

## 2022-05-08 NOTE — Telephone Encounter (Signed)
Patient's PoA Jeneen Rinks will come in today, 11/30 to sign re-enrollment application.  [name], [facility] Patient [place]  [phone] (phone) [phone] (fax) 05/08/2022 11:14 AM

## 2022-05-08 NOTE — Telephone Encounter (Signed)
Oral Oncology Patient Advocate Encounter   Received notification that patient is due for re-enrollment for assistance for [name] through [place].   Re-enrollment process has been initiated and will be submitted upon completion of necessary documents.  [employer]'s phone number [phone].   I will continue to follow until final determination.  [name], [facility] Patient [place]  [phone] (phone) [phone] (fax) 05/08/2022 11:14 AM

## 2022-05-09 ENCOUNTER — Encounter: Payer: Self-pay | Admitting: Internal Medicine

## 2022-05-09 NOTE — Telephone Encounter (Signed)
Oral Oncology Patient Advocate Encounter   Submitted application for assistance for [name] to [employer].   Application submitted via e-fax to [phone]   [employer]'s phone number [phone].   I will continue to check the status until final determination.   [name], [facility] Patient [place]  [phone] (phone) [phone] (fax) 05/09/2022 11:36 AM

## 2022-05-14 ENCOUNTER — Ambulatory Visit
Admission: RE | Admit: 2022-05-14 | Discharge: 2022-05-14 | Disposition: A | Discharge status: Home / Self Care | Payer: Medicare Other | Source: Ambulatory Visit | Attending: Internal Medicine | Admitting: Internal Medicine

## 2022-05-14 ENCOUNTER — Encounter
Admission: RE | Admit: 2022-05-14 | Discharge: 2022-05-14 | Disposition: A | Discharge status: Home / Self Care | Payer: Medicare Other | Source: Ambulatory Visit | Attending: Internal Medicine | Admitting: Internal Medicine

## 2022-05-14 DIAGNOSIS — C61 Malignant neoplasm of prostate: Secondary | ICD-10-CM | POA: Insufficient documentation

## 2022-05-14 DIAGNOSIS — C7951 Secondary malignant neoplasm of bone: Secondary | ICD-10-CM | POA: Insufficient documentation

## 2022-05-14 MED ORDER — IOHEXOL 300 MG/ML  SOLN
80.0000 mL | Freq: Once | INTRAMUSCULAR | Status: AC | PRN
  Administered 2022-05-14: 80 mL via INTRAVENOUS

## 2022-05-14 MED ORDER — TECHNETIUM TC 99M MEDRONATE IV KIT
20.0000 | PACK | Freq: Once | INTRAVENOUS | Status: AC | PRN
  Administered 2022-05-14: 19.25 via INTRAVENOUS

## 2022-05-19 ENCOUNTER — Inpatient Hospital Stay (HOSPITAL_BASED_OUTPATIENT_CLINIC_OR_DEPARTMENT_OTHER): Payer: Medicare Other | Admitting: Internal Medicine

## 2022-05-19 ENCOUNTER — Encounter: Payer: Self-pay | Admitting: Internal Medicine

## 2022-05-19 ENCOUNTER — Ambulatory Visit: Payer: Medicare Other | Admitting: Internal Medicine

## 2022-05-19 ENCOUNTER — Inpatient Hospital Stay: Payer: Medicare Other | Attending: Internal Medicine

## 2022-05-19 ENCOUNTER — Other Ambulatory Visit: Payer: Medicare Other

## 2022-05-19 VITALS — BP 150/80 | HR 93 | Temp 98.1°F | Wt 137.0 lb

## 2022-05-19 DIAGNOSIS — G20A1 Parkinson's disease without dyskinesia, without mention of fluctuations: Secondary | ICD-10-CM | POA: Diagnosis not present

## 2022-05-19 DIAGNOSIS — R8271 Bacteriuria: Secondary | ICD-10-CM | POA: Insufficient documentation

## 2022-05-19 DIAGNOSIS — Z192 Hormone resistant malignancy status: Secondary | ICD-10-CM | POA: Diagnosis not present

## 2022-05-19 DIAGNOSIS — C7951 Secondary malignant neoplasm of bone: Secondary | ICD-10-CM | POA: Diagnosis present

## 2022-05-19 DIAGNOSIS — C61 Malignant neoplasm of prostate: Secondary | ICD-10-CM

## 2022-05-19 DIAGNOSIS — N139 Obstructive and reflux uropathy, unspecified: Secondary | ICD-10-CM | POA: Diagnosis not present

## 2022-05-19 DIAGNOSIS — K224 Dyskinesia of esophagus: Secondary | ICD-10-CM | POA: Insufficient documentation

## 2022-05-19 DIAGNOSIS — J45909 Unspecified asthma, uncomplicated: Secondary | ICD-10-CM | POA: Insufficient documentation

## 2022-05-19 DIAGNOSIS — Z79899 Other long term (current) drug therapy: Secondary | ICD-10-CM | POA: Insufficient documentation

## 2022-05-19 LAB — CBC WITH DIFFERENTIAL/PLATELET
Abs Immature Granulocytes: 0.01 10*3/uL (ref 0.00–0.07)
Basophils Absolute: 0.1 10*3/uL (ref 0.0–0.1)
Basophils Relative: 1 %
Eosinophils Absolute: 0.2 10*3/uL (ref 0.0–0.5)
Eosinophils Relative: 4 %
HCT: 42.6 % (ref 39.0–52.0)
Hemoglobin: 14.3 g/dL (ref 13.0–17.0)
Immature Granulocytes: 0 %
Lymphocytes Relative: 31 %
Lymphs Abs: 1.7 10*3/uL (ref 0.7–4.0)
MCH: 29.7 pg (ref 26.0–34.0)
MCHC: 33.6 g/dL (ref 30.0–36.0)
MCV: 88.4 fL (ref 80.0–100.0)
Monocytes Absolute: 0.6 10*3/uL (ref 0.1–1.0)
Monocytes Relative: 12 %
Neutro Abs: 2.9 10*3/uL (ref 1.7–7.7)
Neutrophils Relative %: 52 %
Platelets: 329 10*3/uL (ref 150–400)
RBC: 4.82 MIL/uL (ref 4.22–5.81)
RDW: 14.3 % (ref 11.5–15.5)
WBC: 5.4 10*3/uL (ref 4.0–10.5)
nRBC: 0 % (ref 0.0–0.2)

## 2022-05-19 LAB — COMPREHENSIVE METABOLIC PANEL
ALT: 6 U/L (ref 0–44)
AST: 28 U/L (ref 15–41)
Albumin: 4.2 g/dL (ref 3.5–5.0)
Alkaline Phosphatase: 153 U/L — ABNORMAL HIGH (ref 38–126)
Anion gap: 10 (ref 5–15)
BUN: 23 mg/dL (ref 8–23)
CO2: 27 mmol/L (ref 22–32)
Calcium: 9.3 mg/dL (ref 8.9–10.3)
Chloride: 101 mmol/L (ref 98–111)
Creatinine, Ser: 1.21 mg/dL (ref 0.61–1.24)
GFR, Estimated: >60 mL/min (ref >60)
Glucose, Bld: 125 mg/dL — ABNORMAL HIGH (ref 70–99)
Potassium: 3.9 mmol/L (ref 3.5–5.1)
Sodium: 138 mmol/L (ref 135–145)
Total Bilirubin: 0.7 mg/dL (ref 0.3–1.2)
Total Protein: 7.6 g/dL (ref 6.5–8.1)

## 2022-05-19 LAB — PSA: Prostatic Specific Antigen: 12.85 ng/mL — ABNORMAL HIGH (ref 0.00–4.00)

## 2022-05-19 NOTE — Progress Notes (Signed)
[facility] OFFICE PROGRESS NOTE  Patient Care Team: Jerrol Banana., MD as PCP - General (Family Medicine) [name], MD as Consulting Physician (Oncology)   Cancer Staging  No matching staging information was found for the patient.   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  Patient was admitted to the ICU in [place].  During hospitalization patient was found to have elevated PSA -  962 and mri showed extensive sclerosis in pelvic, sacral, spine, and scapula, prostate cancer metastatic to bone. Creatinine was 2.94 without normalization. He had severe sepsis and bacteruria.   At discharge patient was sent over to rehab.  Given the family in [place]/brother-patient moved to [place].  Prior to this episode patient was losing weight.  However patient had been very healthy prior to this episode.  He was living alone.  Patient has chronic difficulty swallowing since age of 65.?  Esophagus spasms.     Prostate cancer metastatic to bone ([place])  08/14/2021 Initial Diagnosis   Prostate cancer metastatic to bone ([place])      HISTORY OF PRESENT ILLNESS: Patient in a rolling walker.   Patient lives in assisted living; accompanied by his brother.  Philip [name] 76 y.o.  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  Patient started on apalutamide appx 2 weeks ago after a long) with the patient.  Patient s/p TURP with urology in October.  Noted to have  significant improvement of his urinary complaints.  Does not have a catheter at this time.  Patient has been released from physical therapy.  However gaining weight.  Appetite is improving. ....   Review of Systems  Constitutional:  Positive for malaise/fatigue and weight loss. Negative for chills, diaphoresis and fever.  HENT:  Negative for nosebleeds and sore throat.   Eyes:  Negative for double vision.  Respiratory:  Negative for cough, hemoptysis, sputum production, shortness of breath and wheezing.   Cardiovascular:  Negative for chest pain, palpitations, orthopnea and leg swelling.  Gastrointestinal:  Negative for abdominal pain, blood in stool, constipation, diarrhea, heartburn, melena, nausea and vomiting.  Genitourinary:  Negative for dysuria, frequency and urgency.  Musculoskeletal:  Negative for back pain and joint pain.  Skin: Negative.  Negative for itching and rash.  Neurological:  Positive for weakness. Negative for dizziness, tingling, focal weakness and headaches.  Endo/Heme/Allergies:  Does not bruise/bleed easily.  Psychiatric/Behavioral:  Negative for depression. The patient is not nervous/anxious and does not have insomnia.       PAST MEDICAL HISTORY :  Past Medical History:  Diagnosis Date   Acute kidney failure ([place])    Arthritis    Asthma    Cancer ([place])    Dysphagia    Parkinson disease    Prostate cancer ([place])    Rhabdomyolysis     PAST SURGICAL HISTORY :   Past Surgical History:  Procedure Laterality Date   CARDIAC CATHETERIZATION     KNEE ARTHROSCOPY Right 2012  also lt knee unsure of date   TRANSURETHRAL RESECTION OF PROSTATE N/A 03/17/2022   Procedure: TRANSURETHRAL RESECTION OF THE PROSTATE (TURP);  Surgeon: [name], MD;  Location: [facility] ORS;  Service: Urology;  Laterality: N/A;    FAMILY HISTORY :   Family History  Problem Relation Age of Onset   Arthritis/Rheumatoid Mother    Stroke Father    Hemangiomas Brother     SOCIAL  HISTORY:   Social History   Tobacco Use   Smoking status: Never   Smokeless tobacco: Never  Vaping Use   Vaping Use: Never used  Substance Use Topics   Alcohol use: Yes   Drug use: Never    ALLERGIES:  has No Known Allergies.  MEDICATIONS:  Current Outpatient Medications  Medication Sig Dispense Refill   acetaminophen (TYLENOL) 325 MG tablet Take 650 mg by mouth every 6 (six) hours as needed.     apalutamide ([name]) 60 MG tablet Take 120 mg by mouth daily.     carbidopa-levodopa (SINEMET IR) 25-100 MG tablet Take 1 tablet by mouth 3 (three) times daily. 90 tablet 3   Degarelix Acetate (FIRMAGON [place]) Inject into the skin.     Leuprolide Acetate (ELIGARD ) Inject into the skin.     omeprazole (PRILOSEC) 20 MG capsule Take 1 capsule (20 mg total) by mouth every morning. 90 capsule 3   No current facility-administered medications for this visit.    PHYSICAL EXAMINATION: ECOG PERFORMANCE STATUS: 2 - Symptomatic, <50% confined to bed  BP (!) 150/80 (BP Location: Left Arm, Patient Position: Sitting)   Pulse 93   Temp 98.1 F (36.7 C) (Tympanic)   Wt 137 lb (62.1 kg)   BMI 20.83 kg/m   Filed Weights   05/19/22 0913  Weight: 137 lb (62.1 kg)    Physical Exam Vitals and nursing note reviewed.  HENT:     Head: Normocephalic and atraumatic.     Mouth/Throat:     Pharynx: Oropharynx is clear.  Eyes:     Extraocular Movements: Extraocular movements intact.     Pupils: Pupils are equal, round, and reactive to light.  Cardiovascular:     Rate and Rhythm: Normal rate and regular rhythm.  Pulmonary:     Comments: Decreased breath sounds bilaterally.  Abdominal:     Palpations: Abdomen is soft.  Musculoskeletal:        General: Normal range of motion.     Cervical back: Normal range of motion.  Skin:    General: Skin is warm.  Neurological:     General: No focal deficit present.     Mental Status: He is alert and oriented to person, place, and time.  Psychiatric:         Behavior: Behavior normal.        Judgment: Judgment normal.     LABORATORY DATA:  I have reviewed the data as listed    Component Value Date/Time   NA 138 05/19/2022 0858   NA 140 07/31/2021 1612   K 3.9 05/19/2022 0858   CL 101 05/19/2022 0858   CO2 27 05/19/2022 0858   GLUCOSE 125 (H) 05/19/2022 0858   BUN 23 05/19/2022 0858   BUN 25 07/31/2021 1612   CREATININE 1.21 05/19/2022 0858   CALCIUM 9.3 05/19/2022 0858   PROT 7.6 05/19/2022 0858   PROT 6.5 07/31/2021 1612   ALBUMIN 4.2 05/19/2022 0858   ALBUMIN 4.3 07/31/2021 1612   AST 28 05/19/2022 0858   ALT 6 05/19/2022 0858  ALKPHOS 153 (H) 05/19/2022 0858   BILITOT 0.7 05/19/2022 0858   BILITOT 0.2 07/31/2021 1612   GFRNONAA >60 05/19/2022 0858    No results found for: "SPEP", "UPEP"  Lab Results  Component Value Date   WBC 5.4 05/19/2022   NEUTROABS 2.9 05/19/2022   HGB 14.3 05/19/2022   HCT 42.6 05/19/2022   MCV 88.4 05/19/2022   PLT 329 05/19/2022      Chemistry      Component Value Date/Time   NA 138 05/19/2022 0858   NA 140 07/31/2021 1612   K 3.9 05/19/2022 0858   CL 101 05/19/2022 0858   CO2 27 05/19/2022 0858   BUN 23 05/19/2022 0858   BUN 25 07/31/2021 1612   CREATININE 1.21 05/19/2022 0858      Component Value Date/Time   CALCIUM 9.3 05/19/2022 0858   ALKPHOS 153 (H) 05/19/2022 0858   AST 28 05/19/2022 0858   ALT 6 05/19/2022 0858   BILITOT 0.7 05/19/2022 0858   BILITOT 0.2 07/31/2021 1612       RADIOGRAPHIC STUDIES: I have personally reviewed the radiological images as listed and agreed with the findings in the report. No results found.   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 DEC 2023-multiple bone lesions noted.  DEC 2023-CT scan CAP- no visceral metastases. PSA rising at 65 [NOV 2023].  On apalutamide 4 pills a day [dissolved in water;  mid NOV 2023]  # Patient tolerating apalutamide 4 pills a day [dissolved in water]-fairly well.  Continue current dose.  Monitor PSA.  Patient due for Eligard in 1 month.  #Urinary obstruction -s/p TURP [OCT 2023; Dr.Brandon]- STABLE.   # Dysphagia- [> 30 years-] ?  Esophageal spasms status post barium swallow. STABLE.Marland Kitchen   Continue follow-up with GI.  #Parkinson's-[Dr.Potter]- on sinemet. STABLE.   # Vaccination: s/p Flu shot; recommend COVID/RSV. Pt reluctant.   #Incidental findings on Imaging  CT CAP, 2023: atherosclerosis I reviewed/discussed/counseled the patient.   *Eligard '45mg'$ ; 5/17-2023   # DISPOSITION: # follow up in 1 months- MD: labs- cbc/cmp;PSA; ELigard- - Dr.B  # I reviewed the blood work- with the patient in detail; also reviewed the imaging independently [as summarized above]; and with the patient in detail.           Orders Placed This Encounter  Procedures   CBC with Differential/Platelet    Standing Status:   Future    Standing Expiration Date:   05/19/2023   Comprehensive metabolic panel    Standing Status:   Future    Standing Expiration Date:   05/19/2023   PSA    Standing Status:   Future    Standing Expiration Date:   05/20/2023   All questions were answered. The patient knows to call the clinic with any problems, questions or concerns.      [name], MD 05/19/2022 2:42 PM

## 2022-05-19 NOTE — Assessment & Plan Note (Addendum)
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 DEC 2023-multiple bone lesions noted.  DEC 2023-CT scan CAP- no visceral metastases. PSA rising at 65 [NOV 2023].  On apalutamide 4 pills a day [dissolved in water; mid NOV 2023]  # Patient tolerating apalutamide 4 pills a day [dissolved in water]-fairly well.  Continue current dose.  Monitor PSA.  Patient due for Eligard in 1 month.  #Urinary obstruction -s/p TURP [OCT 2023; Dr.Brandon]- STABLE.   # Dysphagia- [> 30 years-] ?  Esophageal spasms status post barium swallow. STABLE.Marland Kitchen   Continue follow-up with GI.  #Parkinson's-[Dr.Potter]- on sinemet. STABLE.   # Vaccination: s/p Flu shot; recommend COVID/RSV. Pt reluctant.   #Incidental findings on Imaging  CT CAP, 2023: atherosclerosis I reviewed/discussed/counseled the patient.   *Eligard '45mg'$ ; 5/17-2023   # DISPOSITION: # follow up in 1 months- MD: labs- cbc/cmp;PSA; ELigard- - Dr.B  # I reviewed the blood work- with the patient in detail; also reviewed the imaging independently [as summarized above]; and with the patient in detail.

## 2022-05-22 ENCOUNTER — Other Ambulatory Visit: Payer: Medicare Other

## 2022-05-22 ENCOUNTER — Ambulatory Visit: Payer: Medicare Other | Admitting: Internal Medicine

## 2022-05-29 ENCOUNTER — Other Ambulatory Visit: Payer: Self-pay | Admitting: *Deleted

## 2022-05-30 NOTE — Progress Notes (Addendum)
Prescription refills for patient required to be faxed in by the assistance program. Fax left on Dr. Aletha Halim desk for signature by Suezanne Jacquet.

## 2022-06-10 ENCOUNTER — Telehealth: Payer: Self-pay | Admitting: Pharmacist

## 2022-06-10 DIAGNOSIS — C61 Malignant neoplasm of prostate: Secondary | ICD-10-CM

## 2022-06-10 MED ORDER — APALUTAMIDE 60 MG PO TABS: 240.0000 mg | ORAL_TABLET | Freq: Every day | ORAL | 3 refills | Status: DC

## 2022-06-10 NOTE — Telephone Encounter (Signed)
Oral Chemotherapy Pharmacist Encounter   Received forwarded VM from Triage from patient's POA/brother Jeneen Rinks. Jeneen Rinks is reporting not receiving a full month supply of medication from the assistance program.  It appears that back in May when he was originally going to start the dose was 2 tablets (120 mg total) daily. So that is what the assistance program has been sending him. When he actually started in eventually actually started in November, he started on 4 tabs ('240mg'$ ). Dr. Rogue Bussing informed of the above and he would like for me to confirm with Jeneen Rinks that patient is taking 4 tabs ('240mg'$ ) daily, then update rx if needed.  Returned call to [place], LVM.   Darl Pikes, PharmD, BCPS, BCOP, CPP Hematology/Oncology Clinical Pharmacist [place]/DB/AP [facility] [phone]  06/10/2022 2:54 PM

## 2022-06-10 NOTE — Telephone Encounter (Signed)
Spoke with Jeneen Rinks, he confirms patient is taking 4 tablets ('240mg'$ ) daily. Updated rx sent to the dispensing pharmacy [facility],

## 2022-06-11 ENCOUNTER — Telehealth: Payer: Self-pay

## 2022-06-11 ENCOUNTER — Encounter: Payer: Self-pay | Admitting: Internal Medicine

## 2022-06-11 ENCOUNTER — Other Ambulatory Visit (HOSPITAL_COMMUNITY): Payer: Self-pay

## 2022-06-11 NOTE — Telephone Encounter (Signed)
Oral Oncology Patient Advocate Encounter  Prior Authorization for [employer] [place] has been approved.    PA# [id number]  Effective dates: 06/11/22 until further notice  Patients co-pay is $3,333.02.    [name], [facility] Patient [facility]  [phone] (phone) [phone] (fax) 06/11/2022 3:18 PM

## 2022-06-11 NOTE — Telephone Encounter (Signed)
Oral Oncology Patient Advocate Encounter   Received notification that prior authorization for [employer] [place] is required.   PA submitted on 06/11/22  Key [id number]  Status is pending     [name], [place] Patient [place]  [phone] (phone) [phone] (fax) 06/11/2022 2:44 PM

## 2022-06-13 ENCOUNTER — Encounter: Payer: Self-pay | Admitting: Internal Medicine

## 2022-06-20 ENCOUNTER — Inpatient Hospital Stay: Payer: Medicare Other | Attending: Internal Medicine

## 2022-06-20 ENCOUNTER — Encounter: Payer: Self-pay | Admitting: Internal Medicine

## 2022-06-20 ENCOUNTER — Inpatient Hospital Stay: Payer: Medicare Other

## 2022-06-20 ENCOUNTER — Inpatient Hospital Stay (HOSPITAL_BASED_OUTPATIENT_CLINIC_OR_DEPARTMENT_OTHER): Payer: Medicare Other | Admitting: Internal Medicine

## 2022-06-20 ENCOUNTER — Other Ambulatory Visit (HOSPITAL_COMMUNITY): Payer: Self-pay

## 2022-06-20 VITALS — BP 131/81 | HR 84 | Temp 97.3°F | Resp 16 | Wt 138.0 lb

## 2022-06-20 DIAGNOSIS — K224 Dyskinesia of esophagus: Secondary | ICD-10-CM | POA: Diagnosis not present

## 2022-06-20 DIAGNOSIS — Z79899 Other long term (current) drug therapy: Secondary | ICD-10-CM | POA: Insufficient documentation

## 2022-06-20 DIAGNOSIS — C61 Malignant neoplasm of prostate: Secondary | ICD-10-CM | POA: Insufficient documentation

## 2022-06-20 DIAGNOSIS — C7951 Secondary malignant neoplasm of bone: Secondary | ICD-10-CM | POA: Diagnosis not present

## 2022-06-20 DIAGNOSIS — G20A1 Parkinson's disease without dyskinesia, without mention of fluctuations: Secondary | ICD-10-CM | POA: Diagnosis not present

## 2022-06-20 DIAGNOSIS — E559 Vitamin D deficiency, unspecified: Secondary | ICD-10-CM | POA: Diagnosis not present

## 2022-06-20 DIAGNOSIS — Z9079 Acquired absence of other genital organ(s): Secondary | ICD-10-CM | POA: Insufficient documentation

## 2022-06-20 LAB — CBC WITH DIFFERENTIAL/PLATELET
Abs Immature Granulocytes: 0.01 10*3/uL (ref 0.00–0.07)
Basophils Absolute: 0.1 10*3/uL (ref 0.0–0.1)
Basophils Relative: 1 %
Eosinophils Absolute: 0.3 10*3/uL (ref 0.0–0.5)
Eosinophils Relative: 5 %
HCT: 38.5 % — ABNORMAL LOW (ref 39.0–52.0)
Hemoglobin: 13.2 g/dL (ref 13.0–17.0)
Immature Granulocytes: 0 %
Lymphocytes Relative: 20 %
Lymphs Abs: 1.2 10*3/uL (ref 0.7–4.0)
MCH: 30 pg (ref 26.0–34.0)
MCHC: 34.3 g/dL (ref 30.0–36.0)
MCV: 87.5 fL (ref 80.0–100.0)
Monocytes Absolute: 0.8 10*3/uL (ref 0.1–1.0)
Monocytes Relative: 13 %
Neutro Abs: 3.6 10*3/uL (ref 1.7–7.7)
Neutrophils Relative %: 61 %
Platelets: 296 10*3/uL (ref 150–400)
RBC: 4.4 MIL/uL (ref 4.22–5.81)
RDW: 14.1 % (ref 11.5–15.5)
WBC: 5.9 10*3/uL (ref 4.0–10.5)
nRBC: 0 % (ref 0.0–0.2)

## 2022-06-20 LAB — COMPREHENSIVE METABOLIC PANEL
ALT: 13 U/L (ref 0–44)
AST: 25 U/L (ref 15–41)
Albumin: 3.8 g/dL (ref 3.5–5.0)
Alkaline Phosphatase: 89 U/L (ref 38–126)
Anion gap: 9 (ref 5–15)
BUN: 22 mg/dL (ref 8–23)
CO2: 27 mmol/L (ref 22–32)
Calcium: 9 mg/dL (ref 8.9–10.3)
Chloride: 102 mmol/L (ref 98–111)
Creatinine, Ser: 1.15 mg/dL (ref 0.61–1.24)
GFR, Estimated: >60 mL/min (ref >60)
Glucose, Bld: 110 mg/dL — ABNORMAL HIGH (ref 70–99)
Potassium: 3.9 mmol/L (ref 3.5–5.1)
Sodium: 138 mmol/L (ref 135–145)
Total Bilirubin: 0.4 mg/dL (ref 0.3–1.2)
Total Protein: 6.6 g/dL (ref 6.5–8.1)

## 2022-06-20 LAB — PSA: Prostatic Specific Antigen: 3.9 ng/mL (ref 0.00–4.00)

## 2022-06-20 MED ORDER — OYSTER SHELL CALCIUM/D3 500-5 MG-MCG PO TABS: 1.0000 | ORAL_TABLET | Freq: Two times a day (BID) | ORAL | 5 refills | Status: DC

## 2022-06-20 NOTE — Progress Notes (Signed)
[facility] OFFICE PROGRESS NOTE  Patient Care Team: Jerrol Banana., MD as PCP - General (Family Medicine) [name], MD as Consulting Physician (Oncology)   Cancer Staging  No matching staging information was found for the patient.   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  Patient was admitted to the ICU in [place].  During hospitalization patient was found to have elevated PSA -  962 and mri showed extensive sclerosis in pelvic, sacral, spine, and scapula, prostate cancer metastatic to bone. Creatinine was 2.94 without normalization. He had severe sepsis and bacteruria.   At discharge patient was sent over to rehab.  Given the family in [place]/brother-patient moved to [place].  Prior to this episode patient was losing weight.  However patient had been very healthy prior to this episode.  He was living alone.  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  Esophagus spasms.     Prostate cancer metastatic to bone ([facility])  08/14/2021 Initial Diagnosis   Prostate cancer metastatic to bone ([facility])      HISTORY OF PRESENT ILLNESS: Patient in a rolling walker.   Patient lives in assisted living/[facility]; accompanied by his brother.  Steve Youngberg 77 y.o.  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  Patient started on apalutamide appx 6 weeks ago after a long with the patient.   Patient s/p TURP with urology in October.  Noted  to have significant improvement of his urinary complaints.  Does not have a catheter at this time.  Patient has been released from physical therapy.  However gaining weight.  Appetite is improving.   Review of Systems  Constitutional:  Positive for malaise/fatigue and weight loss. Negative for chills, diaphoresis and fever.  HENT:  Negative for nosebleeds and sore throat.   Eyes:  Negative for double vision.  Respiratory:  Negative for cough, hemoptysis, sputum production, shortness of breath and wheezing.   Cardiovascular:  Negative for chest pain, palpitations, orthopnea and leg swelling.  Gastrointestinal:  Negative for abdominal pain, blood in stool, constipation, diarrhea, heartburn, melena, nausea and vomiting.  Genitourinary:  Negative for dysuria, frequency and urgency.  Musculoskeletal:  Negative for back pain and joint pain.  Skin: Negative.  Negative for itching and rash.  Neurological:  Positive for weakness. Negative for dizziness, tingling, focal weakness and headaches.  Endo/Heme/Allergies:  Does not bruise/bleed easily.  Psychiatric/Behavioral:  Negative for depression. The patient is not nervous/anxious and does not have insomnia.       PAST MEDICAL HISTORY :  Past Medical History:  Diagnosis Date   Acute kidney failure (HCC)    Arthritis    Asthma    Cancer ([facility])    Dysphagia    Parkinson disease    Prostate cancer ([facility])    Rhabdomyolysis     PAST SURGICAL HISTORY :   Past Surgical History:  Procedure Laterality Date   CARDIAC CATHETERIZATION     KNEE ARTHROSCOPY Right  2012   also lt knee unsure of date   TRANSURETHRAL RESECTION OF PROSTATE N/A 03/17/2022   Procedure: TRANSURETHRAL RESECTION OF THE PROSTATE (TURP);  Surgeon: [name], MD;  Location: [facility] ORS;  Service: Urology;  Laterality: N/A;    FAMILY HISTORY :   Family History  Problem Relation Age of Onset   Arthritis/Rheumatoid Mother    Stroke Father    Hemangiomas Brother     SOCIAL  HISTORY:   Social History   Tobacco Use   Smoking status: Never   Smokeless tobacco: Never  Vaping Use   Vaping Use: Never used  Substance Use Topics   Alcohol use: Yes   Drug use: Never    ALLERGIES:  has No Known Allergies.  MEDICATIONS:  Current Outpatient Medications  Medication Sig Dispense Refill   acetaminophen (TYLENOL) 325 MG tablet Take 650 mg by mouth every 6 (six) hours as needed.     apalutamide ([name]) 60 MG tablet Take 4 tablets (240 mg total) by mouth daily. 120 tablet 3   calcium-vitamin D (OSCAL WITH D) 500-5 MG-MCG tablet Take 1 tablet by mouth 2 (two) times daily. 60 tablet 5   carbidopa-levodopa (SINEMET IR) 25-100 MG tablet Take 1 tablet by mouth 3 (three) times daily. 90 tablet 3   Leuprolide Acetate (ELIGARD [place]) Inject into the skin.     omeprazole (PRILOSEC) 20 MG capsule Take 1 capsule (20 mg total) by mouth every morning. 90 capsule 3   Degarelix Acetate (FIRMAGON [place]) Inject into the skin. (Patient not taking: Reported on 06/20/2022)     No current facility-administered medications for this visit.    PHYSICAL EXAMINATION: ECOG PERFORMANCE STATUS: 2 - Symptomatic, <50% confined to bed  BP 131/81 (BP Location: Right Arm, Patient Position: Sitting)   Pulse 84   Temp (!) 97.3 F (36.3 C) (Tympanic)   Resp 16   Wt 138 lb (62.6 kg)   BMI 20.98 kg/m   Filed Weights   06/20/22 1000  Weight: 138 lb (62.6 kg)     Physical Exam Vitals and nursing note reviewed.  HENT:     Head: Normocephalic and atraumatic.     Mouth/Throat:     Pharynx: Oropharynx is clear.  Eyes:     Extraocular Movements: Extraocular movements intact.     Pupils: Pupils are equal, round, and reactive to light.  Cardiovascular:     Rate and Rhythm: Normal rate and regular rhythm.  Pulmonary:     Comments: Decreased breath sounds bilaterally.  Abdominal:     Palpations: Abdomen is soft.  Musculoskeletal:        General: Normal range of motion.     Cervical back: Normal  range of motion.  Skin:    General: Skin is warm.  Neurological:     General: No focal deficit present.     Mental Status: He is alert and oriented to person, place, and time.  Psychiatric:        Behavior: Behavior normal.        Judgment: Judgment normal.     LABORATORY DATA:  I have reviewed the data as listed    Component Value Date/Time   NA 138 06/20/2022 0957   NA 140 07/31/2021 1612   K 3.9 06/20/2022 0957   CL 102 06/20/2022 0957   CO2 27 06/20/2022 0957   GLUCOSE 110 (H) 06/20/2022 0957   BUN 22 06/20/2022 0957   BUN 25 07/31/2021 1612   CREATININE 1.15 06/20/2022 0957  CALCIUM 9.0 06/20/2022 0957   PROT 6.6 06/20/2022 0957   PROT 6.5 07/31/2021 1612   ALBUMIN 3.8 06/20/2022 0957   ALBUMIN 4.3 07/31/2021 1612   AST 25 06/20/2022 0957   ALT 13 06/20/2022 0957   ALKPHOS 89 06/20/2022 0957   BILITOT 0.4 06/20/2022 0957   BILITOT 0.2 07/31/2021 1612   GFRNONAA >60 06/20/2022 0957    No results found for: "SPEP", "UPEP"  Lab Results  Component Value Date   WBC 5.9 06/20/2022   NEUTROABS 3.6 06/20/2022   HGB 13.2 06/20/2022   HCT 38.5 (L) 06/20/2022   MCV 87.5 06/20/2022   PLT 296 06/20/2022      Chemistry      Component Value Date/Time   NA 138 06/20/2022 0957   NA 140 07/31/2021 1612   K 3.9 06/20/2022 0957   CL 102 06/20/2022 0957   CO2 27 06/20/2022 0957   BUN 22 06/20/2022 0957   BUN 25 07/31/2021 1612   CREATININE 1.15 06/20/2022 0957      Component Value Date/Time   CALCIUM 9.0 06/20/2022 0957   ALKPHOS 89 06/20/2022 0957   AST 25 06/20/2022 0957   ALT 13 06/20/2022 0957   BILITOT 0.4 06/20/2022 0957   BILITOT 0.2 07/31/2021 1612       RADIOGRAPHIC STUDIES: I have personally reviewed the radiological images as listed and agreed with the findings in the report. No results found.   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 DEC 2023-multiple bone lesions noted.  DEC 2023-CT scan CAP- no visceral metastases. PSA rising at 65 [NOV 2023].  On apalutamide 4 pills a day [dissolved in water; mid NOV 2023]  # Patient tolerating apalutamide 4 pills a day [dissolved in water]-fairly well. PSA- improving from [NOV 2023] 65 to 12 [dec 2023]. Continue Eligard q6 M + Erleda.   # discussed the role of Zometa to decrease skeletal related events [pain; fractures; need for radiation; hypercalcemia].  Discussed the potential side effects including but not limited to-infusion reaction; hypocalcemia; and Osteo-necrosis of jaw. Recommend ca+Vit D BID; script sent/order for [employer].   #Urinary obstruction -s/p TURP [OCT 2023; Dr.Brandon]- STABLE.   # Dysphagia- [> 30 years-] ?  Esophageal spasms status post barium swallow. STABLE. Continue follow-up with GI.  #Parkinson's-[Dr.Potter]- weaned on sinemet. STABLE.   # Vaccination: s/p Flu shot; recommend COVID/RSV. Pt reluctant.   *Eligard '45mg'$ - next May mid 2024.   # script- for ca+vit D BID- [facility] # DISPOSITION: # follow up in 2 months- MD: labs- cbc/cmp;PSA; vit D 25-OH levels; possible zometa-   Dr.B           No orders of the defined types were placed in this encounter.  All questions were answered. The patient knows to call the clinic with any problems, questions or concerns.      [name], MD 06/20/2022 10:52 AM

## 2022-06-20 NOTE — Patient Instructions (Signed)
#  recommend dental clearance re: Zometa  # Ca+ vit D twice a day- script sent.

## 2022-06-20 NOTE — Progress Notes (Signed)
Patient dealing with constipation 2 weeks ago.  Wakes up during the night with urinary frequency.

## 2022-06-20 NOTE — Assessment & Plan Note (Addendum)
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 DEC 2023-multiple bone lesions noted.  DEC 2023-CT scan CAP- no visceral metastases. PSA rising at 65 [NOV 2023].  On apalutamide 4 pills a day [dissolved in water; mid NOV 2023]  # Patient tolerating apalutamide 4 pills a day [dissolved in water]-fairly well. PSA- improving from [NOV 2023] 65 to 12 [dec 2023]. Continue Eligard q6 M + Erleda.   # discussed the role of Zometa to decrease skeletal related events [pain; fractures; need for radiation; hypercalcemia].  Discussed the potential side effects including but not limited to-infusion reaction; hypocalcemia; and Osteo-necrosis of jaw. Recommend ca+Vit D BID; script sent/order for [employer].   #Urinary obstruction -s/p TURP [OCT 2023; Dr.Brandon]- STABLE.   # Dysphagia- [> 30 years-] ?  Esophageal spasms status post barium swallow. STABLE. Continue follow-up with GI.  #Parkinson's-[Dr.Potter]- weaned on sinemet. STABLE.   # Vaccination: s/p Flu shot; recommend COVID/RSV. Pt reluctant.   *Eligard '45mg'$ - next May mid 2024.   # script- for ca+vit D BID- [facility] # DISPOSITION: # follow up in 2 months- MD: labs- cbc/cmp;PSA; vit D 25-OH levels; possible zometa-   Dr.B

## 2022-06-20 NOTE — Telephone Encounter (Signed)
Oral Oncology Patient Advocate Encounter   Received notification re-enrollment for assistance for [name] through [employer] has been approved. Patient may continue to receive their medication at $0 from this program.    [facility] phone number [phone].   Effective dates: 06/09/22 through 06/09/23  I have spoken to the patient.  [name], [facility] [place]  [phone] (phone) [phone] (fax) 06/20/2022 10:18 AM

## 2022-08-19 ENCOUNTER — Ambulatory Visit: Payer: Medicare Other

## 2022-08-19 ENCOUNTER — Inpatient Hospital Stay: Payer: Medicare Other

## 2022-08-19 ENCOUNTER — Inpatient Hospital Stay: Payer: Medicare Other | Admitting: Internal Medicine

## 2022-08-21 ENCOUNTER — Inpatient Hospital Stay: Payer: Medicare Other | Attending: Internal Medicine

## 2022-08-21 ENCOUNTER — Inpatient Hospital Stay: Payer: Medicare Other

## 2022-08-21 ENCOUNTER — Inpatient Hospital Stay (HOSPITAL_BASED_OUTPATIENT_CLINIC_OR_DEPARTMENT_OTHER): Payer: Medicare Other | Admitting: Internal Medicine

## 2022-08-21 ENCOUNTER — Encounter: Payer: Self-pay | Admitting: Internal Medicine

## 2022-08-21 VITALS — BP 133/81 | HR 81 | Temp 97.3°F | Resp 16 | Wt 135.7 lb

## 2022-08-21 DIAGNOSIS — G20A1 Parkinson's disease without dyskinesia, without mention of fluctuations: Secondary | ICD-10-CM | POA: Diagnosis not present

## 2022-08-21 DIAGNOSIS — N139 Obstructive and reflux uropathy, unspecified: Secondary | ICD-10-CM | POA: Diagnosis not present

## 2022-08-21 DIAGNOSIS — R1310 Dysphagia, unspecified: Secondary | ICD-10-CM | POA: Diagnosis not present

## 2022-08-21 DIAGNOSIS — C61 Malignant neoplasm of prostate: Secondary | ICD-10-CM | POA: Insufficient documentation

## 2022-08-21 DIAGNOSIS — R5383 Other fatigue: Secondary | ICD-10-CM | POA: Insufficient documentation

## 2022-08-21 DIAGNOSIS — C7951 Secondary malignant neoplasm of bone: Secondary | ICD-10-CM | POA: Diagnosis not present

## 2022-08-21 DIAGNOSIS — E559 Vitamin D deficiency, unspecified: Secondary | ICD-10-CM

## 2022-08-21 LAB — CBC WITH DIFFERENTIAL/PLATELET
Abs Immature Granulocytes: 0.01 10*3/uL (ref 0.00–0.07)
Basophils Absolute: 0.1 10*3/uL (ref 0.0–0.1)
Basophils Relative: 1 %
Eosinophils Absolute: 0.3 10*3/uL (ref 0.0–0.5)
Eosinophils Relative: 4 %
HCT: 38.7 % — ABNORMAL LOW (ref 39.0–52.0)
Hemoglobin: 13.1 g/dL (ref 13.0–17.0)
Immature Granulocytes: 0 %
Lymphocytes Relative: 25 %
Lymphs Abs: 1.7 10*3/uL (ref 0.7–4.0)
MCH: 30 pg (ref 26.0–34.0)
MCHC: 33.9 g/dL (ref 30.0–36.0)
MCV: 88.6 fL (ref 80.0–100.0)
Monocytes Absolute: 0.8 10*3/uL (ref 0.1–1.0)
Monocytes Relative: 12 %
Neutro Abs: 3.9 10*3/uL (ref 1.7–7.7)
Neutrophils Relative %: 58 %
Platelets: 285 10*3/uL (ref 150–400)
RBC: 4.37 MIL/uL (ref 4.22–5.81)
RDW: 13.5 % (ref 11.5–15.5)
WBC: 6.7 10*3/uL (ref 4.0–10.5)
nRBC: 0 % (ref 0.0–0.2)

## 2022-08-21 LAB — COMPREHENSIVE METABOLIC PANEL
ALT: 6 U/L (ref 0–44)
AST: 21 U/L (ref 15–41)
Albumin: 4 g/dL (ref 3.5–5.0)
Alkaline Phosphatase: 69 U/L (ref 38–126)
Anion gap: 6 (ref 5–15)
BUN: 23 mg/dL (ref 8–23)
CO2: 27 mmol/L (ref 22–32)
Calcium: 9.1 mg/dL (ref 8.9–10.3)
Chloride: 102 mmol/L (ref 98–111)
Creatinine, Ser: 1.08 mg/dL (ref 0.61–1.24)
GFR, Estimated: >60 mL/min (ref >60)
Glucose, Bld: 102 mg/dL — ABNORMAL HIGH (ref 70–99)
Potassium: 3.6 mmol/L (ref 3.5–5.1)
Sodium: 135 mmol/L (ref 135–145)
Total Bilirubin: 0.6 mg/dL (ref 0.3–1.2)
Total Protein: 6.4 g/dL — ABNORMAL LOW (ref 6.5–8.1)

## 2022-08-21 LAB — VITAMIN D 25 HYDROXY (VIT D DEFICIENCY, FRACTURES): Vit D, 25-Hydroxy: 38.73 ng/mL (ref 30–100)

## 2022-08-21 LAB — PSA: Prostatic Specific Antigen: 1.69 ng/mL (ref 0.00–4.00)

## 2022-08-21 NOTE — Assessment & Plan Note (Addendum)
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 DEC 2023-multiple bone lesions noted.  DEC 2023-CT scan CAP- no visceral metastases. PSA rising at 65 [NOV 2023].  On apalutamide 4 pills a day [dissolved in water; mid NOV 2023].  # Patient tolerating apalutamide 4 pills a day [dissolved in water]-fairly well. PSA- improving from [NOV 2023] 65 to JAN, 2024- PSA- 3.9. Continue Eligard q6 M + apalutamide.  Discussed again malignancy not curable; treatments are palliative.  # Discussed the role of Zometa to decrease skeletal related events [pain; fractures; need for radiation; hypercalcemia].  Discussed the potential side effects including but not limited to-infusion reaction; hypocalcemia; and Osteo-necrosis of jaw. continue ca+Vit D BID. Awaiting  Dental clearance [Dr.Swain] for zometa.  #Urinary obstruction -s/p TURP [OCT 2023; Dr.Brandon]- stable  # Dysphagia- [> 30 years-] ?  Esophageal spasms status post barium swallow. stable Continue follow-up with GI.  #Parkinson's-[Dr.Potter]- weaned on sinemet. stable  # Vaccination: s/p Flu shot; recommend COVID/RSV. Pt reluctant.   *Eligard 45mg - next May mid 2024.   # script- for ca+vit D BID- [facility] # DISPOSITION: # HOLD zometa  # follow up in 2 months- MD: labs- cbc/cmp;PSA;  Eligard- possible zometa-   Dr.B

## 2022-08-21 NOTE — Progress Notes (Signed)
Patient denies new problems/concerns today.   °

## 2022-08-21 NOTE — Progress Notes (Signed)
[facility] OFFICE PROGRESS NOTE  Patient Care Team: Eulas Post, MD as PCP - General (Family Medicine) [name], MD as Consulting Physician (Oncology)   Cancer Staging  No matching staging information was found for the patient.   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  Patient was admitted to the ICU in [place].  During hospitalization patient was found to have elevated PSA -  962 and mri showed extensive sclerosis in pelvic, sacral, spine, and scapula, prostate cancer metastatic to bone. Creatinine was 2.94 without normalization. He had severe sepsis and bacteruria.   At discharge patient was sent over to rehab.  Given the family in [place]/brother-patient moved to [place].  Prior to this episode patient was losing weight.  However patient had been very healthy prior to this episode.  He was living alone.  Patient has chronic difficulty swallowing since age of 16.?  Esophagus spasms.     Prostate cancer metastatic to bone ([facility])  08/14/2021 Initial Diagnosis   Prostate cancer metastatic to bone ([facility])      HISTORY OF PRESENT ILLNESS: Patient in a rolling walker.   Patient lives in assisted living/[facility]; accompanied by his brother.  Philip [name] 77 y.o.  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  Complains of on going mild fatigue.  Noted to have significant improvement of his urinary complaints.  Does  not have a catheter at this time.  However gaining weight.  Appetite is improving.   Review of Systems  Constitutional:  Positive for malaise/fatigue and weight loss. Negative for chills, diaphoresis and fever.  HENT:  Negative for nosebleeds and sore throat.   Eyes:  Negative for double vision.  Respiratory:  Negative for cough, hemoptysis, sputum production, shortness of breath and wheezing.   Cardiovascular:  Negative for chest pain, palpitations, orthopnea and leg swelling.  Gastrointestinal:  Negative for abdominal pain, blood in stool, constipation, diarrhea, heartburn, melena, nausea and vomiting.  Genitourinary:  Negative for dysuria, frequency and urgency.  Musculoskeletal:  Negative for back pain and joint pain.  Skin: Negative.  Negative for itching and rash.  Neurological:  Positive for weakness. Negative for dizziness, tingling, focal weakness and headaches.  Endo/Heme/Allergies:  Does not bruise/bleed easily.  Psychiatric/Behavioral:  Negative for depression. The patient is not nervous/anxious and does not have insomnia.       PAST MEDICAL HISTORY :  Past Medical History:  Diagnosis Date   Acute kidney failure ([facility])    Arthritis    Asthma    Cancer ([place])    Dysphagia    Parkinson disease    Prostate cancer ([place])    Rhabdomyolysis     PAST SURGICAL HISTORY :   Past Surgical History:  Procedure Laterality Date   CARDIAC CATHETERIZATION     KNEE ARTHROSCOPY Right 2012   also lt knee unsure of date   TRANSURETHRAL RESECTION OF PROSTATE N/A 03/17/2022  Procedure: TRANSURETHRAL RESECTION OF THE PROSTATE (TURP);  Surgeon: [name], MD;  Location: [facility] ORS;  Service: Urology;  Laterality: N/A;    FAMILY HISTORY :   Family History  Problem Relation Age of Onset   Arthritis/Rheumatoid Mother    Stroke Father    Hemangiomas Brother     SOCIAL HISTORY:   Social History   Tobacco Use   Smoking status: Never   Smokeless tobacco: Never  Vaping Use    Vaping Use: Never used  Substance Use Topics   Alcohol use: Yes   Drug use: Never    ALLERGIES:  has No Known Allergies.  MEDICATIONS:  Current Outpatient Medications  Medication Sig Dispense Refill   acetaminophen (TYLENOL) 325 MG tablet Take 650 mg by mouth every 6 (six) hours as needed.     apalutamide ([name]) 60 MG tablet Take 4 tablets (240 mg total) by mouth daily. 120 tablet 3   calcium-vitamin D (OSCAL WITH D) 500-5 MG-MCG tablet Take 1 tablet by mouth 2 (two) times daily. 60 tablet 5   carbidopa-levodopa (SINEMET IR) 25-100 MG tablet Take 1 tablet by mouth 3 (three) times daily. 90 tablet 3   Leuprolide Acetate (ELIGARD [place]) Inject into the skin.     omeprazole (PRILOSEC) 20 MG capsule Take 1 capsule (20 mg total) by mouth every morning. 90 capsule 3   Degarelix Acetate (FIRMAGON [place]) Inject into the skin. (Patient not taking: Reported on 06/20/2022)     No current facility-administered medications for this visit.    PHYSICAL EXAMINATION: ECOG PERFORMANCE STATUS: 2 - Symptomatic, <50% confined to bed  BP 133/81 (BP Location: Right Arm, Patient Position: Sitting)   Pulse 81   Temp (!) 97.3 F (36.3 C) (Tympanic)   Resp 16   Wt 135 lb 11.2 oz (61.6 kg)   BMI 20.63 kg/m   Filed Weights   08/21/22 0800  Weight: 135 lb 11.2 oz (61.6 kg)     Physical Exam Vitals and nursing note reviewed.  HENT:     Head: Normocephalic and atraumatic.     Mouth/Throat:     Pharynx: Oropharynx is clear.  Eyes:     Extraocular Movements: Extraocular movements intact.     Pupils: Pupils are equal, round, and reactive to light.  Cardiovascular:     Rate and Rhythm: Normal rate and regular rhythm.  Pulmonary:     Comments: Decreased breath sounds bilaterally.  Abdominal:     Palpations: Abdomen is soft.  Musculoskeletal:        General: Normal range of motion.     Cervical back: Normal range of motion.  Skin:    General: Skin is warm.  Neurological:     General: No focal  deficit present.     Mental Status: He is alert and oriented to person, place, and time.  Psychiatric:        Behavior: Behavior normal.        Judgment: Judgment normal.     LABORATORY DATA:  I have reviewed the data as listed    Component Value Date/Time   NA 135 08/21/2022 0829   NA 140 07/31/2021 1612   K 3.6 08/21/2022 0829   CL 102 08/21/2022 0829   CO2 27 08/21/2022 0829   GLUCOSE 102 (H) 08/21/2022 0829   BUN 23 08/21/2022 0829   BUN 25 07/31/2021 1612   CREATININE 1.08 08/21/2022 0829   CALCIUM 9.1 08/21/2022 0829   PROT 6.4 (L) 08/21/2022 0829   PROT 6.5  07/31/2021 1612   ALBUMIN 4.0 08/21/2022 0829   ALBUMIN 4.3 07/31/2021 1612   AST 21 08/21/2022 0829   ALT 6 08/21/2022 0829   ALKPHOS 69 08/21/2022 0829   BILITOT 0.6 08/21/2022 0829   BILITOT 0.2 07/31/2021 1612   GFRNONAA >60 08/21/2022 0829    No results found for: "SPEP", "UPEP"  Lab Results  Component Value Date   WBC 6.7 08/21/2022   NEUTROABS 3.9 08/21/2022   HGB 13.1 08/21/2022   HCT 38.7 (L) 08/21/2022   MCV 88.6 08/21/2022   PLT 285 08/21/2022      Chemistry      Component Value Date/Time   NA 135 08/21/2022 0829   NA 140 07/31/2021 1612   K 3.6 08/21/2022 0829   CL 102 08/21/2022 0829   CO2 27 08/21/2022 0829   BUN 23 08/21/2022 0829   BUN 25 07/31/2021 1612   CREATININE 1.08 08/21/2022 0829      Component Value Date/Time   CALCIUM 9.1 08/21/2022 0829   ALKPHOS 69 08/21/2022 0829   AST 21 08/21/2022 0829   ALT 6 08/21/2022 0829   BILITOT 0.6 08/21/2022 0829   BILITOT 0.2 07/31/2021 1612       RADIOGRAPHIC STUDIES: I have personally reviewed the radiological images as listed and agreed with the findings in the report. No results found.   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 DEC 2023-multiple bone lesions noted.  DEC 2023-CT scan CAP-  no visceral metastases. PSA rising at 65 [NOV 2023].  On apalutamide 4 pills a day [dissolved in water; mid NOV 2023].  # Patient tolerating apalutamide 4 pills a day [dissolved in water]-fairly well. PSA- improving from [NOV 2023] 65 to JAN, 2024- PSA- 3.9. Continue Eligard q6 M + apalutamide.  Discussed again malignancy not curable; treatments are palliative.  # Discussed the role of Zometa to decrease skeletal related events [pain; fractures; need for radiation; hypercalcemia].  Discussed the potential side effects including but not limited to-infusion reaction; hypocalcemia; and Osteo-necrosis of jaw. continue ca+Vit D BID. Awaiting  Dental clearance [Dr.Swain] for zometa.  #Urinary obstruction -s/p TURP [OCT 2023; Dr.Brandon]- stable  # Dysphagia- [> 30 years-] ?  Esophageal spasms status post barium swallow. stable Continue follow-up with GI.  #Parkinson's-[Dr.Potter]- weaned on sinemet. stable  # Vaccination: s/p Flu shot; recommend COVID/RSV. Pt reluctant.   *Eligard '45mg'$ - next May mid 2024.   # script- for ca+vit D BID- [facility] # DISPOSITION: # HOLD zometa  # follow up in 2 months- MD: labs- cbc/cmp;PSA;  Eligard- possible zometa-   Dr.B   Orders Placed This Encounter  Procedures   CBC with Differential ([facility] Only)    Standing Status:   Future    Standing Expiration Date:   08/21/2023   CMP ([facility] only)    Standing Status:   Future    Standing Expiration Date:   08/21/2023   PSA    Standing Status:   Future    Standing Expiration Date:   08/21/2023   All questions were answered. The patient knows to call the clinic with any problems, questions or concerns.      [name], MD 08/21/2022 9:57 AM

## 2022-09-03 ENCOUNTER — Other Ambulatory Visit: Payer: Self-pay | Admitting: Urology

## 2022-10-21 ENCOUNTER — Inpatient Hospital Stay: Payer: Medicare Other

## 2022-10-21 ENCOUNTER — Inpatient Hospital Stay (HOSPITAL_BASED_OUTPATIENT_CLINIC_OR_DEPARTMENT_OTHER): Payer: Medicare Other | Admitting: Internal Medicine

## 2022-10-21 ENCOUNTER — Inpatient Hospital Stay: Payer: Medicare Other | Attending: Internal Medicine

## 2022-10-21 ENCOUNTER — Encounter: Payer: Self-pay | Admitting: Internal Medicine

## 2022-10-21 VITALS — BP 130/75 | HR 76 | Temp 97.2°F | Ht 68.0 in | Wt 134.8 lb

## 2022-10-21 DIAGNOSIS — Z79818 Long term (current) use of other agents affecting estrogen receptors and estrogen levels: Secondary | ICD-10-CM | POA: Diagnosis not present

## 2022-10-21 DIAGNOSIS — C61 Malignant neoplasm of prostate: Secondary | ICD-10-CM | POA: Diagnosis present

## 2022-10-21 DIAGNOSIS — C7951 Secondary malignant neoplasm of bone: Secondary | ICD-10-CM | POA: Insufficient documentation

## 2022-10-21 DIAGNOSIS — G20A1 Parkinson's disease without dyskinesia, without mention of fluctuations: Secondary | ICD-10-CM | POA: Insufficient documentation

## 2022-10-21 DIAGNOSIS — Z192 Hormone resistant malignancy status: Secondary | ICD-10-CM | POA: Diagnosis not present

## 2022-10-21 DIAGNOSIS — Z79899 Other long term (current) drug therapy: Secondary | ICD-10-CM | POA: Diagnosis not present

## 2022-10-21 LAB — CMP (CANCER CENTER ONLY)
ALT: <5 U/L (ref 0–44)
AST: 18 U/L (ref 15–41)
Albumin: 4.1 g/dL (ref 3.5–5.0)
Alkaline Phosphatase: 71 U/L (ref 38–126)
Anion gap: 9 (ref 5–15)
BUN: 26 mg/dL — ABNORMAL HIGH (ref 8–23)
CO2: 28 mmol/L (ref 22–32)
Calcium: 10 mg/dL (ref 8.9–10.3)
Chloride: 101 mmol/L (ref 98–111)
Creatinine: 1.02 mg/dL (ref 0.61–1.24)
GFR, Estimated: >60 mL/min (ref >60)
Glucose, Bld: 106 mg/dL — ABNORMAL HIGH (ref 70–99)
Potassium: 4.3 mmol/L (ref 3.5–5.1)
Sodium: 138 mmol/L (ref 135–145)
Total Bilirubin: 0.3 mg/dL (ref 0.3–1.2)
Total Protein: 6.9 g/dL (ref 6.5–8.1)

## 2022-10-21 LAB — CBC WITH DIFFERENTIAL (CANCER CENTER ONLY)
Abs Immature Granulocytes: 0.02 10*3/uL (ref 0.00–0.07)
Basophils Absolute: 0.1 10*3/uL (ref 0.0–0.1)
Basophils Relative: 1 %
Eosinophils Absolute: 0.1 10*3/uL (ref 0.0–0.5)
Eosinophils Relative: 2 %
HCT: 40.2 % (ref 39.0–52.0)
Hemoglobin: 13.5 g/dL (ref 13.0–17.0)
Immature Granulocytes: 0 %
Lymphocytes Relative: 19 %
Lymphs Abs: 1.3 10*3/uL (ref 0.7–4.0)
MCH: 30.3 pg (ref 26.0–34.0)
MCHC: 33.6 g/dL (ref 30.0–36.0)
MCV: 90.3 fL (ref 80.0–100.0)
Monocytes Absolute: 0.7 10*3/uL (ref 0.1–1.0)
Monocytes Relative: 10 %
Neutro Abs: 4.5 10*3/uL (ref 1.7–7.7)
Neutrophils Relative %: 68 %
Platelet Count: 300 10*3/uL (ref 150–400)
RBC: 4.45 MIL/uL (ref 4.22–5.81)
RDW: 13.2 % (ref 11.5–15.5)
WBC Count: 6.6 10*3/uL (ref 4.0–10.5)
nRBC: 0 % (ref 0.0–0.2)

## 2022-10-21 MED ORDER — LEUPROLIDE ACETATE (6 MONTH) 45 MG ~~LOC~~ KIT
45.0000 mg | PACK | Freq: Once | SUBCUTANEOUS | Status: AC
  Administered 2022-10-21: 45 mg via SUBCUTANEOUS

## 2022-10-21 NOTE — Progress Notes (Signed)
[facility] OFFICE PROGRESS NOTE  Patient Care Team: [name], MD as PCP - General (Family Medicine) [name], MD as Consulting Physician (Oncology)   Cancer Staging  No matching staging information was found for the patient.   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  Patient was admitted to the ICU in [place].  During hospitalization patient was found to have elevated PSA -  962 and mri showed extensive sclerosis in pelvic, sacral, spine, and scapula, prostate cancer metastatic to bone. Creatinine was 2.94 without normalization. He had severe sepsis and bacteruria.   At discharge patient was sent over to rehab.  Given the family in [place]/brother-patient moved to [place].  Prior to this episode patient was losing weight.  However patient had been very healthy prior to this episode.  He was living alone.  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  Esophagus spasms.     Prostate cancer metastatic to bone (HCC)  08/14/2021 Initial Diagnosis   Prostate cancer metastatic to bone (HCC)      HISTORY OF PRESENT ILLNESS: Patient in a rolling walker.   Patient lives in assisted living/[facility]; accompanied by his brother.  Philip [name] 77 y.o.  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  No concerns today.  Noted to have significant improvement of his urinary complaints.  However gaining weight.   Appetite is improving.   Patient is interested in moving to [place] to stay by himself.  Review of Systems  Constitutional:  Positive for malaise/fatigue and weight loss. Negative for chills, diaphoresis and fever.  HENT:  Negative for nosebleeds and sore throat.   Eyes:  Negative for double vision.  Respiratory:  Negative for cough, hemoptysis, sputum production, shortness of breath and wheezing.   Cardiovascular:  Negative for chest pain, palpitations, orthopnea and leg swelling.  Gastrointestinal:  Negative for abdominal pain, blood in stool, constipation, diarrhea, heartburn, melena, nausea and vomiting.  Genitourinary:  Negative for dysuria, frequency and urgency.  Musculoskeletal:  Negative for back pain and joint pain.  Skin: Negative.  Negative for itching and rash.  Neurological:  Positive for weakness. Negative for dizziness, tingling, focal weakness and headaches.  Endo/Heme/Allergies:  Does not bruise/bleed easily.  Psychiatric/Behavioral:  Negative for depression. The patient is not nervous/anxious and does not have insomnia.       PAST MEDICAL HISTORY :  Past Medical History:  Diagnosis Date   Acute kidney failure (HCC)    Arthritis    Asthma    Cancer (HCC)    Dysphagia    Parkinson disease    Prostate cancer (HCC)    Rhabdomyolysis     PAST SURGICAL HISTORY :   Past Surgical History:  Procedure Laterality Date   CARDIAC CATHETERIZATION     KNEE ARTHROSCOPY Right 2012   also lt knee unsure of date   TRANSURETHRAL RESECTION OF PROSTATE N/A 03/17/2022  Procedure: TRANSURETHRAL RESECTION OF THE PROSTATE (TURP);  Surgeon: Vanna Scotland, MD;  Location: [facility] ORS;  Service: Urology;  Laterality: N/A;    FAMILY HISTORY :   Family History  Problem Relation Age of Onset   Arthritis/Rheumatoid Mother    Stroke Father    Hemangiomas Brother     SOCIAL HISTORY:   Social History   Tobacco Use   Smoking status: Never   Smokeless tobacco: Never  Vaping Use    Vaping Use: Never used  Substance Use Topics   Alcohol use: Yes   Drug use: Never    ALLERGIES:  has No Known Allergies.  MEDICATIONS:  Current Outpatient Medications  Medication Sig Dispense Refill   acetaminophen (TYLENOL) 325 MG tablet Take 650 mg by mouth every 6 (six) hours as needed.     apalutamide ([name]) 60 MG tablet Take 4 tablets (240 mg total) by mouth daily. 120 tablet 3   calcium-vitamin D (OSCAL WITH D) 500-5 MG-MCG tablet Take 1 tablet by mouth 2 (two) times daily. 60 tablet 5   carbidopa-levodopa (SINEMET IR) 25-100 MG tablet Take 1 tablet by mouth 3 (three) times daily. 90 tablet 3   Leuprolide Acetate (ELIGARD [place]) Inject into the skin.     omeprazole (PRILOSEC) 20 MG capsule Take 1 capsule (20 mg total) by mouth every morning. 90 capsule 3   Degarelix Acetate (FIRMAGON [place]) Inject into the skin. (Patient not taking: Reported on 06/20/2022)     No current facility-administered medications for this visit.    PHYSICAL EXAMINATION: ECOG PERFORMANCE STATUS: 2 - Symptomatic, <50% confined to bed  BP 130/75   Pulse 76   Temp (!) 97.2 F (36.2 C) (Tympanic)   Ht 5\' 8"  (1.727 m)   Wt 134 lb 12.8 oz (61.1 kg)   SpO2 97%   BMI 20.50 kg/m   Filed Weights   10/21/22 1253  Weight: 134 lb 12.8 oz (61.1 kg)      Physical Exam Vitals and nursing note reviewed.  HENT:     Head: Normocephalic and atraumatic.     Mouth/Throat:     Pharynx: Oropharynx is clear.  Eyes:     Extraocular Movements: Extraocular movements intact.     Pupils: Pupils are equal, round, and reactive to light.  Cardiovascular:     Rate and Rhythm: Normal rate and regular rhythm.  Pulmonary:     Comments: Decreased breath sounds bilaterally.  Abdominal:     Palpations: Abdomen is soft.  Musculoskeletal:        General: Normal range of motion.     Cervical back: Normal range of motion.  Skin:    General: Skin is warm.  Neurological:     General: No focal deficit present.     Mental  Status: He is alert and oriented to person, place, and time.  Psychiatric:        Behavior: Behavior normal.        Judgment: Judgment normal.     LABORATORY DATA:  I have reviewed the data as listed    Component Value Date/Time   NA 138 10/21/2022 1306   NA 140 07/31/2021 1612   K 4.3 10/21/2022 1306   CL 101 10/21/2022 1306   CO2 28 10/21/2022 1306   GLUCOSE 106 (H) 10/21/2022 1306   BUN 26 (H) 10/21/2022 1306   BUN 25 07/31/2021 1612   CREATININE 1.02 10/21/2022 1306   CALCIUM 10.0 10/21/2022 1306   PROT 6.9 10/21/2022 1306   PROT  6.5 07/31/2021 1612   ALBUMIN 4.1 10/21/2022 1306   ALBUMIN 4.3 07/31/2021 1612   AST 18 10/21/2022 1306   ALT <5 10/21/2022 1306   ALKPHOS 71 10/21/2022 1306   BILITOT 0.3 10/21/2022 1306   GFRNONAA >60 10/21/2022 1306    No results found for: "SPEP", "UPEP"  Lab Results  Component Value Date   WBC 6.6 10/21/2022   NEUTROABS 4.5 10/21/2022   HGB 13.5 10/21/2022   HCT 40.2 10/21/2022   MCV 90.3 10/21/2022   PLT 300 10/21/2022      Chemistry      Component Value Date/Time   NA 138 10/21/2022 1306   NA 140 07/31/2021 1612   K 4.3 10/21/2022 1306   CL 101 10/21/2022 1306   CO2 28 10/21/2022 1306   BUN 26 (H) 10/21/2022 1306   BUN 25 07/31/2021 1612   CREATININE 1.02 10/21/2022 1306      Component Value Date/Time   CALCIUM 10.0 10/21/2022 1306   ALKPHOS 71 10/21/2022 1306   AST 18 10/21/2022 1306   ALT <5 10/21/2022 1306   BILITOT 0.3 10/21/2022 1306       RADIOGRAPHIC STUDIES: I have personally reviewed the radiological images as listed and agreed with the findings in the report. No results found.   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 DEC 2023-multiple bone lesions noted.  DEC 2023-CT scan CAP- no visceral metastases. PSA rising at 65 [NOV 2023].  On apalutamide 4 pills a day  [dissolved in water; mid NOV 2023].  # Patient tolerating apalutamide 4 pills a day [dissolved in water]-fairly well. PSA- improving from [NOV 2023] 65 to [facility]  2024- PSA- 1.69. Continue Eligard q6 M + apalutamide.  Discussed again malignancy not curable; treatments are palliative. Stable.   # Discussed the role of Zometa to decrease skeletal related events [pain; fractures; need for radiation; hypercalcemia].  Discussed the potential side effects including but not limited to-infusion reaction; hypocalcemia; and Osteo-necrosis of jaw. continue ca+Vit D BID. [Dr.Swain] Patient declines. No dental clearance   # Urinary obstruction -s/p TURP [OCT 2023; Dr.Brandon]- stable  # Dysphagia- [> 30 years-] ?  Esophageal spasms status post barium swallow. stable Continue follow-up with GI.  #Parkinson's-[Dr.Potter]- weaned on sinemet. stable  # Vaccination: s/p Flu shot; recommend COVID/RSV. Pt reluctant.   # Social issues: patient wants to move back to [place], [place]. discussed with patient that it could be difficult for him to live alone given his limited performance status and also the incurable disease.  *Eligard 45mg - May 15th, 2024;  [facility]  # DISPOSITION: # recheck BP # HOLD zometa; Eligard today # follow up in 2 months- MD: labs- cbc/cmp;PSA;  Dr.B   Orders Placed This Encounter  Procedures   CBC with Differential (Cancer Center Only)    Standing Status:   Future    Standing Expiration Date:   10/21/2023   CMP (Cancer Center only)    Standing Status:   Future    Standing Expiration Date:   10/21/2023   PSA    Standing Status:   Future    Standing Expiration Date:   10/21/2023   All questions were answered. The patient knows to call the clinic with any problems, questions or concerns.      [name], MD 10/21/2022 2:21 PM

## 2022-10-21 NOTE — Progress Notes (Signed)
No concerns today.  Pt states he shouldn't be down to get zometa.

## 2022-10-21 NOTE — Assessment & Plan Note (Addendum)
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 DEC 2023-multiple bone lesions noted.  DEC 2023-CT scan CAP- no visceral metastases. PSA rising at 65 [NOV 2023].  On apalutamide 4 pills a day [dissolved in water; mid NOV 2023].  # Patient tolerating apalutamide 4 pills a day [dissolved in water]-fairly well. PSA- improving from [NOV 2023] 65 to [facility]  2024- PSA- 1.69. Continue Eligard q6 M + apalutamide.  Discussed again malignancy not curable; treatments are palliative. Stable.   # Discussed the role of Zometa to decrease skeletal related events [pain; fractures; need for radiation; hypercalcemia].  Discussed the potential side effects including but not limited to-infusion reaction; hypocalcemia; and Osteo-necrosis of jaw. continue ca+Vit D BID. [Dr.Swain] Patient declines. No dental clearance   # Urinary obstruction -s/p TURP [OCT 2023; Dr.Brandon]- stable  # Dysphagia- [> 30 years-] ?  Esophageal spasms status post barium swallow. stable Continue follow-up with GI.  #Parkinson's-[Dr.Potter]- weaned on sinemet. stable  # Vaccination: s/p Flu shot; recommend COVID/RSV. Pt reluctant.   # Social issues: patient wants to move back to [place], [place]. discussed with patient that it could be difficult for him to live alone given his limited performance status and also the incurable disease.  *Eligard 45mg - May 15th, 2024;  [facility]  # DISPOSITION: # recheck BP # HOLD zometa; Eligard today # follow up in 2 months- MD: labs- cbc/cmp;PSA;  Dr.B

## 2022-10-22 LAB — PSA: Prostatic Specific Antigen: 3.07 ng/mL (ref 0.00–4.00)

## 2022-11-04 ENCOUNTER — Other Ambulatory Visit: Payer: Self-pay | Admitting: *Deleted

## 2022-11-04 DIAGNOSIS — C61 Malignant neoplasm of prostate: Secondary | ICD-10-CM

## 2022-11-04 MED ORDER — APALUTAMIDE 60 MG PO TABS
240.0000 mg | ORAL_TABLET | Freq: Every day | ORAL | 3 refills | Status: DC
Start: 2022-11-04 — End: 2023-02-02

## 2022-11-04 NOTE — Telephone Encounter (Signed)
CBC with Differential (Cancer Center Only) Order: [id number] Status: Final result     Visible to patient: No (inaccessible in MyChart)     Next appt: 12/25/2022 at 10:30 AM in Oncology ([facility])     Dx: Prostate cancer metastatic to bone (HCC)   0 Result Notes          Component Ref Range & Units 2 wk ago 2 mo ago 4 mo ago 5 mo ago 6 mo ago 7 mo ago 9 mo ago  WBC Count 4.0 - 10.5 K/uL 6.6 6.7 5.9 5.4 8.0 6.1 6.6  RBC 4.22 - 5.81 MIL/uL 4.45 4.37 4.40 4.82 4.18 Low  4.10 Low  3.97 Low   Hemoglobin 13.0 - 17.0 g/dL 40.9 81.1 91.4 78.2 95.6 Low  12.2 Low  12.1 Low   HCT 39.0 - 52.0 % 40.2 38.7 Low  38.5 Low  42.6 37.1 Low  36.8 Low  36.1 Low   MCV 80.0 - 100.0 fL 90.3 88.6 87.5 88.4 88.8 89.8 90.9  MCH 26.0 - 34.0 pg 30.3 30.0 30.0 29.7 29.2 29.8 30.5  MCHC 30.0 - 36.0 g/dL 21.3 08.6 57.8 46.9 62.9 33.2 33.5  RDW 11.5 - 15.5 % 13.2 13.5 14.1 14.3 14.0 13.2 13.5  Platelet Count 150 - 400 K/uL 300 285 296 329 293 284 273  nRBC 0.0 - 0.2 % 0.0 0.0 0.0 0.0 0.0 0.0 0.0  Neutrophils Relative % % 68 58 61 52 64 65 61  Neutro Abs 1.7 - 7.7 K/uL 4.5 3.9 3.6 2.9 5.1 3.9 4.0  Lymphocytes Relative % 19 25 20 31 22 23 25   Lymphs Abs 0.7 - 4.0 K/uL 1.3 1.7 1.2 1.7 1.7 1.4 1.6  Monocytes Relative % 10 12 13 12 9 10 9   Monocytes Absolute 0.1 - 1.0 K/uL 0.7 0.8 0.8 0.6 0.7 0.6 0.6  Eosinophils Relative % 2 4 5 4 4 1 4   Eosinophils Absolute 0.0 - 0.5 K/uL 0.1 0.3 0.3 0.2 0.3 0.1 0.3  Basophils Relative % 1 1 1 1 1 1 1   Basophils Absolute 0.0 - 0.1 K/uL 0.1 0.1 0.1 0.1 0.1 0.1 0.1  Immature Granulocytes % 0 0 0 0 0 0 0  Abs Immature Granulocytes 0.00 - 0.07 K/uL 0.02 0.01 CM 0.01 CM 0.01 CM 0.02 CM 0.02 CM 0.02 CM  Comment: Performed at [facility], [address] [place], [place] [address]  Resulting Agency [facility]         Specimen Collected: 10/21/22 13:06 Last Resulted: 10/21/22 13:19        Component Ref Range & Units 2 wk ago 2 mo ago 4 mo ago 5 mo ago 6 mo ago 7 mo ago 9 mo ago  Prostatic Specific Antigen 0.00 - 4.00 ng/mL 3.07 1.69 CM 3.90 CM 12.85 High  CM 65.39 High  CM 35.24 High  CM 15.66 High   CMP (Cancer Center only) Order: [id number] Status: Final result     Visible to patient: No (inaccessible in MyChart)     Next appt: 12/25/2022 at 10:30 AM in Oncology ([facility])     Dx: Prostate cancer metastatic to bone (HCC)   0 Result Notes          Component Ref Range & Units 2 wk ago 2 mo ago 4 mo ago 5 mo ago 6 mo ago 7 mo ago 9 mo ago  Sodium 135 - 145 mmol/L 138 135 138 138 138 139 136  Potassium 3.5 - 5.1 mmol/L 4.3 3.6 3.9 3.9 4.0 4.0 4.0  Chloride 98 - 111 mmol/L 101 102 102 101 102 102 101  CO2 22 - 32 mmol/L 28 27 27 27 26 29 29   Glucose, Bld 70 - 99 mg/dL 478 High  295 High  CM 110 High  CM 125 High  CM 138 High  CM 113 High  CM 90 CM  Comment: Glucose reference range applies only to samples taken after fasting for at least 8 hours.  BUN 8 - 23 mg/dL 26 High  23 22 23 31  High  22 27 High   Creatinine 0.61 - 1.24 mg/dL 6.21 3.08 6.57 8.46 9.62 1.10 1.32 High   Calcium 8.9 - 10.3 mg/dL 95.2 9.1 9.0 9.3 8.9 8.9 9.2  Total Protein 6.5 - 8.1 g/dL 6.9 6.4 Low  6.6 7.6 7.1 7.3 7.0  Albumin 3.5 - 5.0 g/dL 4.1 4.0 3.8 4.2 4.0 3.8 4.1  AST 15 - 41 U/L 18 21 25 28 20 17 18   ALT 0 - 44 U/L 5 6 13 6 5 7 5   Alkaline Phosphatase 38 - 126 U/L 71 69 89 153 High  98 76 70  Total Bilirubin 0.3 - 1.2 mg/dL 0.3 0.6 0.4 0.7 0.3 0.3 0.4  GFR, Estimated >60 mL/min >60        Comment: (NOTE) Calculated using the CKD-EPI Creatinine Equation (2021)  Anion gap 5 - 15 9 6  CM 9 CM 10 CM 10 CM 8 CM 6 CM  Comment: Performed at [facility], [address] [place], [place] [address]  Resulting Agency [facility]         Specimen Collected: 10/21/22 13:06 Last Resulted:  10/21/22 13:40

## 2022-11-25 ENCOUNTER — Other Ambulatory Visit: Payer: Self-pay | Admitting: Internal Medicine

## 2022-12-25 ENCOUNTER — Inpatient Hospital Stay: Payer: Medicare Other | Admitting: Internal Medicine

## 2022-12-25 ENCOUNTER — Inpatient Hospital Stay: Payer: Medicare Other | Attending: Internal Medicine

## 2022-12-25 ENCOUNTER — Encounter: Payer: Self-pay | Admitting: Internal Medicine

## 2022-12-25 VITALS — BP 149/86 | HR 77 | Temp 98.1°F | Resp 17 | Ht 68.0 in | Wt 138.8 lb

## 2022-12-25 DIAGNOSIS — C61 Malignant neoplasm of prostate: Secondary | ICD-10-CM | POA: Insufficient documentation

## 2022-12-25 DIAGNOSIS — G20A1 Parkinson's disease without dyskinesia, without mention of fluctuations: Secondary | ICD-10-CM | POA: Diagnosis not present

## 2022-12-25 DIAGNOSIS — Z9079 Acquired absence of other genital organ(s): Secondary | ICD-10-CM | POA: Diagnosis not present

## 2022-12-25 DIAGNOSIS — R1310 Dysphagia, unspecified: Secondary | ICD-10-CM | POA: Insufficient documentation

## 2022-12-25 DIAGNOSIS — Z192 Hormone resistant malignancy status: Secondary | ICD-10-CM | POA: Insufficient documentation

## 2022-12-25 DIAGNOSIS — Z79899 Other long term (current) drug therapy: Secondary | ICD-10-CM | POA: Insufficient documentation

## 2022-12-25 DIAGNOSIS — M129 Arthropathy, unspecified: Secondary | ICD-10-CM | POA: Insufficient documentation

## 2022-12-25 DIAGNOSIS — N139 Obstructive and reflux uropathy, unspecified: Secondary | ICD-10-CM | POA: Diagnosis not present

## 2022-12-25 DIAGNOSIS — C7951 Secondary malignant neoplasm of bone: Secondary | ICD-10-CM | POA: Insufficient documentation

## 2022-12-25 LAB — CBC WITH DIFFERENTIAL (CANCER CENTER ONLY)
Abs Immature Granulocytes: 0.02 10*3/uL (ref 0.00–0.07)
Basophils Absolute: 0.1 10*3/uL (ref 0.0–0.1)
Basophils Relative: 1 %
Eosinophils Absolute: 0.1 10*3/uL (ref 0.0–0.5)
Eosinophils Relative: 2 %
HCT: 38.1 % — ABNORMAL LOW (ref 39.0–52.0)
Hemoglobin: 13.2 g/dL (ref 13.0–17.0)
Immature Granulocytes: 0 %
Lymphocytes Relative: 22 %
Lymphs Abs: 1.5 10*3/uL (ref 0.7–4.0)
MCH: 30.8 pg (ref 26.0–34.0)
MCHC: 34.6 g/dL (ref 30.0–36.0)
MCV: 89 fL (ref 80.0–100.0)
Monocytes Absolute: 0.8 10*3/uL (ref 0.1–1.0)
Monocytes Relative: 11 %
Neutro Abs: 4.5 10*3/uL (ref 1.7–7.7)
Neutrophils Relative %: 64 %
Platelet Count: 289 10*3/uL (ref 150–400)
RBC: 4.28 MIL/uL (ref 4.22–5.81)
RDW: 13.1 % (ref 11.5–15.5)
WBC Count: 7 10*3/uL (ref 4.0–10.5)
nRBC: 0 % (ref 0.0–0.2)

## 2022-12-25 LAB — CMP (CANCER CENTER ONLY)
ALT: 6 U/L (ref 0–44)
AST: 16 U/L (ref 15–41)
Albumin: 4 g/dL (ref 3.5–5.0)
Alkaline Phosphatase: 78 U/L (ref 38–126)
Anion gap: 10 (ref 5–15)
BUN: 23 mg/dL (ref 8–23)
CO2: 28 mmol/L (ref 22–32)
Calcium: 9.3 mg/dL (ref 8.9–10.3)
Chloride: 100 mmol/L (ref 98–111)
Creatinine: 1 mg/dL (ref 0.61–1.24)
GFR, Estimated: >60 mL/min (ref >60)
Glucose, Bld: 102 mg/dL — ABNORMAL HIGH (ref 70–99)
Potassium: 3.9 mmol/L (ref 3.5–5.1)
Sodium: 138 mmol/L (ref 135–145)
Total Bilirubin: 0.4 mg/dL (ref 0.3–1.2)
Total Protein: 6.6 g/dL (ref 6.5–8.1)

## 2022-12-25 LAB — PSA: Prostatic Specific Antigen: 15.67 ng/mL — ABNORMAL HIGH (ref 0.00–4.00)

## 2022-12-25 NOTE — Progress Notes (Signed)
No complaints of any pain. No blood in urine.Appetite is good. Walks over  [place] with his walker in the assissted living.

## 2022-12-25 NOTE — Assessment & Plan Note (Addendum)
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 DEC 2023-multiple bone lesions noted.  DEC 2023-CT scan CAP- no visceral metastases. PSA rising at 65 [NOV 2023].  On apalutamide 4 pills a day [dissolved in water; mid NOV 2023]. DECLINED Zometa.   # Patient tolerating apalutamide 4 pills a day [dissolved in water]-fairly well. PSA- improving from [NOV 2023] 65; MAY  2024- PSA- 3.0; mots recently rising.  Continue Eligard q6 M + apalutamide.  Discussed again malignancy not curable; treatments are palliative. Stable. Will order PSMA PET scan if continues to go up- consider for pluvicto in future.   # Urinary obstruction -s/p TURP [OCT 2023; Dr.Brandon]- stable  # Dysphagia- [> 30 years-] ?  Esophageal spasms status post barium swallow. stable Continue follow-up with GI. stable  #Parkinson's-[Dr.Potter]- weaned on sinemet. Stable  # Social issues: patient wants to move back to [place] [place] discussed with patient that it could be difficult for him to live alone given his limited performance status and also the incurable disease.  *Eligard 45mg - May 15th, 2024;  [facility]  # DISPOSITION: # follow up in 2 months- MD: labs- cbc/cmp; PSA- Dr.B

## 2022-12-25 NOTE — Progress Notes (Signed)
[facility] OFFICE PROGRESS NOTE  Patient Care Team: [name], MD as PCP - General (Family Medicine) [name], MD as Consulting Physician (Oncology)   Cancer Staging  No matching staging information was found for the patient.    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  Patient was admitted to the ICU in [place].  During hospitalization patient was found to have elevated PSA -  962 and mri showed extensive sclerosis in pelvic, sacral, spine, and scapula, prostate cancer metastatic to bone. Creatinine was 2.94 without normalization. He had severe sepsis and bacteruria.   At discharge patient was sent over to rehab.  Given the family in [place]/brother-patient moved to [place].  Prior to this episode patient was losing weight.  However patient had been very healthy prior to this episode.  He was living alone.  Patient has chronic difficulty swallowing since age of 54.?  Esophagus spasms.     Prostate cancer metastatic to bone (HCC)  08/14/2021 Initial Diagnosis   Prostate cancer metastatic to bone (HCC)      HISTORY OF PRESENT ILLNESS: Patient in a rolling walker.   Patient lives in assisted living/[facility]; accompanied by his brother.  Zafar Debrosse 77 y.o.  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  No complaints of any pain. No blood in urine.Appetite is good. Walks over a mile aday with his walker in  the assissted living.    However gaining weight.  Appetite is improving.   Patient is interested in moving to [place] to stay by himself.  Review of Systems  Constitutional:  Positive for malaise/fatigue and weight loss. Negative for chills, diaphoresis and fever.  HENT:  Negative for nosebleeds and sore throat.   Eyes:  Negative for double vision.  Respiratory:  Negative for cough, hemoptysis, sputum production, shortness of breath and wheezing.   Cardiovascular:  Negative for chest pain, palpitations, orthopnea and leg swelling.  Gastrointestinal:  Negative for abdominal pain, blood in stool, constipation, diarrhea, heartburn, melena, nausea and vomiting.  Genitourinary:  Negative for dysuria, frequency and urgency.  Musculoskeletal:  Negative for back pain and joint pain.  Skin: Negative.  Negative for itching and rash.  Neurological:  Positive for weakness. Negative for dizziness, tingling, focal weakness and headaches.  Endo/Heme/Allergies:  Does not bruise/bleed easily.  Psychiatric/Behavioral:  Negative for depression. The patient is not nervous/anxious and does not have insomnia.       PAST MEDICAL HISTORY :  Past Medical History:  Diagnosis Date   Acute kidney failure (HCC)    Arthritis    Asthma    Cancer (HCC)    Dysphagia    Parkinson disease    Prostate cancer (HCC)    Rhabdomyolysis     PAST SURGICAL HISTORY :   Past Surgical History:  Procedure Laterality Date   CARDIAC CATHETERIZATION     KNEE ARTHROSCOPY Right 2012   also lt knee  unsure of date   TRANSURETHRAL RESECTION OF PROSTATE N/A 03/17/2022   Procedure: TRANSURETHRAL RESECTION OF THE PROSTATE (TURP);  Surgeon: Vanna Scotland, MD;  Location: [facility] ORS;  Service: Urology;  Laterality: N/A;    FAMILY HISTORY :   Family History  Problem Relation Age of Onset   Arthritis/Rheumatoid Mother    Stroke Father    Hemangiomas Brother     SOCIAL HISTORY:   Social History   Tobacco Use   Smoking  status: Never   Smokeless tobacco: Never  Vaping Use   Vaping status: Never Used  Substance Use Topics   Alcohol use: Yes   Drug use: Never    ALLERGIES:  has No Known Allergies.  MEDICATIONS:  Current Outpatient Medications  Medication Sig Dispense Refill   apalutamide ([name]) 60 MG tablet Take 4 tablets (240 mg total) by mouth daily. 120 tablet 3   calcium-vitamin D (OSCAL WITH D) 500-5 MG-MCG tablet TAKE 1 TABLET BY MOUTH TWICE A DAY. 60 tablet 10   carbidopa-levodopa (SINEMET IR) 25-100 MG tablet Take 1 tablet by mouth 3 (three) times daily. 90 tablet 3   Leuprolide Acetate (ELIGARD Bloomer) Inject into the skin.     omeprazole (PRILOSEC) 20 MG capsule Take 1 capsule (20 mg total) by mouth every morning. 90 capsule 3   acetaminophen (TYLENOL) 325 MG tablet Take 650 mg by mouth every 6 (six) hours as needed.     No current facility-administered medications for this visit.    PHYSICAL EXAMINATION: ECOG PERFORMANCE STATUS: 2 - Symptomatic, <50% confined to bed  BP (!) 149/86 (BP Location: Left Arm, Patient Position: Sitting)   Pulse 77   Temp 98.1 F (36.7 C)   Resp 17   Ht 5\' 8"  (1.727 m)   Wt 138 lb 12.8 oz (63 kg)   SpO2 97%   BMI 21.10 kg/m   Filed Weights   12/25/22 1103  Weight: 138 lb 12.8 oz (63 kg)       Physical Exam Vitals and nursing note reviewed.  HENT:     Head: Normocephalic and atraumatic.     Mouth/Throat:     Pharynx: Oropharynx is clear.  Eyes:     Extraocular Movements: Extraocular movements intact.     Pupils: Pupils are equal, round, and reactive to light.  Cardiovascular:     Rate and Rhythm: Normal rate and regular rhythm.  Pulmonary:     Comments: Decreased breath sounds bilaterally.  Abdominal:     Palpations: Abdomen is soft.  Musculoskeletal:        General: Normal range of motion.     Cervical back: Normal range of motion.  Skin:    General: Skin is warm.  Neurological:     General: No focal deficit present.     Mental  Status: He is alert and oriented to person, place, and time.  Psychiatric:        Behavior: Behavior normal.        Judgment: Judgment normal.     LABORATORY DATA:  I have reviewed the data as listed    Component Value Date/Time   NA 138 12/25/2022 1036   NA 140 07/31/2021 1612   K 3.9 12/25/2022 1036   CL 100 12/25/2022 1036   CO2 28 12/25/2022 1036   GLUCOSE 102 (H) 12/25/2022 1036   BUN 23 12/25/2022 1036   BUN 25 07/31/2021 1612   CREATININE 1.00 12/25/2022 1036   CALCIUM 9.3 12/25/2022 1036   PROT 6.6 12/25/2022  1036   PROT 6.5 07/31/2021 1612   ALBUMIN 4.0 12/25/2022 1036   ALBUMIN 4.3 07/31/2021 1612   AST 16 12/25/2022 1036   ALT 6 12/25/2022 1036   ALKPHOS 78 12/25/2022 1036   BILITOT 0.4 12/25/2022 1036   GFRNONAA >60 12/25/2022 1036    No results found for: "SPEP", "UPEP"  Lab Results  Component Value Date   WBC 7.0 12/25/2022   NEUTROABS 4.5 12/25/2022   HGB 13.2 12/25/2022   HCT 38.1 (L) 12/25/2022   MCV 89.0 12/25/2022   PLT 289 12/25/2022      Chemistry      Component Value Date/Time   NA 138 12/25/2022 1036   NA 140 07/31/2021 1612   K 3.9 12/25/2022 1036   CL 100 12/25/2022 1036   CO2 28 12/25/2022 1036   BUN 23 12/25/2022 1036   BUN 25 07/31/2021 1612   CREATININE 1.00 12/25/2022 1036      Component Value Date/Time   CALCIUM 9.3 12/25/2022 1036   ALKPHOS 78 12/25/2022 1036   AST 16 12/25/2022 1036   ALT 6 12/25/2022 1036   BILITOT 0.4 12/25/2022 1036       RADIOGRAPHIC STUDIES: I have personally reviewed the radiological images as listed and agreed with the findings in the report. No results found.   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 DEC 2023-multiple bone lesions noted.  DEC 2023-CT scan CAP- no visceral metastases. PSA rising at 65 [NOV 2023].  On apalutamide 4 pills a day [dissolved  in water; mid NOV 2023]. DECLINED Zometa.   # Patient tolerating apalutamide 4 pills a day [dissolved in water]-fairly well. PSA- improving from [NOV 2023] 65; MAY  2024- PSA- 3.0; mots recently rising.  Continue Eligard q6 M + apalutamide.  Discussed again malignancy not curable; treatments are palliative. Stable. Will order PSMA PET scan if continues to go up- consider for pluvicto in future.   # Urinary obstruction -s/p TURP [OCT 2023; Dr.Brandon]- stable  # Dysphagia- [> 30 years-] ?  Esophageal spasms status post barium swallow. stable Continue follow-up with GI. stable  #Parkinson's-[Dr.Potter]- weaned on sinemet. Stable  # Social issues: patient wants to move back to [facility] discussed with patient that it could be difficult for him to live alone given his limited performance status and also the incurable disease.  *Eligard 45mg - May 15th, 2024;  [facility]  # DISPOSITION: # follow up in 2 months- MD: labs- cbc/cmp; PSA- Dr.B   Orders Placed This Encounter  Procedures   CBC with Differential (Cancer Center Only)    Standing Status:   Future    Standing Expiration Date:   12/25/2023   CMP (Cancer Center only)    Standing Status:   Future    Standing Expiration Date:   12/25/2023   PSA    Standing Status:   Future    Standing Expiration Date:   12/25/2023   All questions were answered. The patient knows to call the clinic with any problems, questions or concerns.      [name], MD 12/25/2022 8:35 PM

## 2022-12-26 ENCOUNTER — Telehealth: Payer: Self-pay | Admitting: Internal Medicine

## 2022-12-26 DIAGNOSIS — C61 Malignant neoplasm of prostate: Secondary | ICD-10-CM

## 2022-12-26 NOTE — Telephone Encounter (Signed)
Spoke to pt re: rising PSA; recommend PSMA PET scan.  Res-schedule the sep appts to 1 month; PSMA PET priro- Thanks GB

## 2023-01-05 ENCOUNTER — Telehealth: Payer: Self-pay

## 2023-01-05 NOTE — Telephone Encounter (Addendum)
Received notification that patient's brother, Jyshawn Lascelles, was having difficulties ordering refill of [name] from new pharmacy that [facility] has transitioned to. They have moved from [facility] to [facility]. I called [facility] and was informed that patient should have received a bridge supply from [facility] for transition period and that they would be unable to process or ship anything until Wednesday, 01/07/23. I called patient's brother back and informed him of this. Mox Yerby knows to call [facility] at [phone] opt 2 > opt 2 on Wednesday to initiate refill shipment. I will follow up on Wednesday, 01/07/23, as well.   Ardeen Fillers, CPhT Oncology Pharmacy Patient Advocate  [facility]  [phone] (phone) [phone] (fax) 01/05/2023 10:56 AM

## 2023-01-06 ENCOUNTER — Telehealth: Payer: Self-pay

## 2023-01-06 NOTE — Telephone Encounter (Signed)
duplicate

## 2023-01-06 NOTE — Telephone Encounter (Signed)
Ephriam Knuckles, I saw your note from yesterday. This patients brother is calling back and left a message that they still haven't received the [name].

## 2023-01-07 NOTE — Telephone Encounter (Signed)
[facility] to follow up on previous note. Representative informed me that patient was scheduled to receive [name] shipment today 01/07/23 and that future refills would be automatic. I called patient's brother, Caron Than, and left a message informing him that medication should be arriving today and that refills were set to automatic with [facility]. I also left my phone number of [phone] should Rosanne Ashing have any further questions.    Ardeen Fillers, CPhT Oncology Pharmacy Patient Advocate  [facility]  [phone] (phone) [phone] (fax) 01/07/2023 8:50 AM

## 2023-01-26 ENCOUNTER — Ambulatory Visit
Admission: RE | Admit: 2023-01-26 | Discharge: 2023-01-26 | Disposition: A | Discharge status: Home / Self Care | Payer: Medicare Other | Source: Ambulatory Visit | Attending: Internal Medicine | Admitting: Internal Medicine

## 2023-01-26 DIAGNOSIS — C61 Malignant neoplasm of prostate: Secondary | ICD-10-CM | POA: Diagnosis present

## 2023-01-26 DIAGNOSIS — C7951 Secondary malignant neoplasm of bone: Secondary | ICD-10-CM | POA: Insufficient documentation

## 2023-01-26 MED ORDER — PIFLIFOLASTAT F 18 (PYLARIFY) INJECTION
9.0000 | Freq: Once | INTRAVENOUS | Status: AC
  Administered 2023-01-26: 8.88 via INTRAVENOUS

## 2023-02-02 ENCOUNTER — Inpatient Hospital Stay: Payer: Medicare Other | Admitting: Internal Medicine

## 2023-02-02 ENCOUNTER — Encounter: Payer: Self-pay | Admitting: Internal Medicine

## 2023-02-02 ENCOUNTER — Telehealth: Payer: Self-pay | Admitting: *Deleted

## 2023-02-02 ENCOUNTER — Inpatient Hospital Stay: Payer: Medicare Other | Attending: Internal Medicine

## 2023-02-02 DIAGNOSIS — N139 Obstructive and reflux uropathy, unspecified: Secondary | ICD-10-CM | POA: Insufficient documentation

## 2023-02-02 DIAGNOSIS — Z9079 Acquired absence of other genital organ(s): Secondary | ICD-10-CM | POA: Insufficient documentation

## 2023-02-02 DIAGNOSIS — C61 Malignant neoplasm of prostate: Secondary | ICD-10-CM | POA: Insufficient documentation

## 2023-02-02 DIAGNOSIS — R200 Anesthesia of skin: Secondary | ICD-10-CM | POA: Insufficient documentation

## 2023-02-02 DIAGNOSIS — R1310 Dysphagia, unspecified: Secondary | ICD-10-CM | POA: Insufficient documentation

## 2023-02-02 DIAGNOSIS — C7951 Secondary malignant neoplasm of bone: Secondary | ICD-10-CM | POA: Insufficient documentation

## 2023-02-02 DIAGNOSIS — G20A1 Parkinson's disease without dyskinesia, without mention of fluctuations: Secondary | ICD-10-CM | POA: Insufficient documentation

## 2023-02-02 LAB — PSA: Prostatic Specific Antigen: 31.46 ng/mL — ABNORMAL HIGH (ref 0.00–4.00)

## 2023-02-02 LAB — CBC WITH DIFFERENTIAL (CANCER CENTER ONLY)
Abs Immature Granulocytes: 0.02 10*3/uL (ref 0.00–0.07)
Basophils Absolute: 0.1 10*3/uL (ref 0.0–0.1)
Basophils Relative: 1 %
Eosinophils Absolute: 0.3 10*3/uL (ref 0.0–0.5)
Eosinophils Relative: 4 %
HCT: 38.3 % — ABNORMAL LOW (ref 39.0–52.0)
Hemoglobin: 13.1 g/dL (ref 13.0–17.0)
Immature Granulocytes: 0 %
Lymphocytes Relative: 24 %
Lymphs Abs: 1.4 10*3/uL (ref 0.7–4.0)
MCH: 30.6 pg (ref 26.0–34.0)
MCHC: 34.2 g/dL (ref 30.0–36.0)
MCV: 89.5 fL (ref 80.0–100.0)
Monocytes Absolute: 0.7 10*3/uL (ref 0.1–1.0)
Monocytes Relative: 12 %
Neutro Abs: 3.5 10*3/uL (ref 1.7–7.7)
Neutrophils Relative %: 59 %
Platelet Count: 274 10*3/uL (ref 150–400)
RBC: 4.28 MIL/uL (ref 4.22–5.81)
RDW: 12.8 % (ref 11.5–15.5)
WBC Count: 5.9 10*3/uL (ref 4.0–10.5)
nRBC: 0 % (ref 0.0–0.2)

## 2023-02-02 LAB — CMP (CANCER CENTER ONLY)
ALT: 6 U/L (ref 0–44)
AST: 17 U/L (ref 15–41)
Albumin: 3.9 g/dL (ref 3.5–5.0)
Alkaline Phosphatase: 78 U/L (ref 38–126)
Anion gap: 7 (ref 5–15)
BUN: 21 mg/dL (ref 8–23)
CO2: 26 mmol/L (ref 22–32)
Calcium: 9.2 mg/dL (ref 8.9–10.3)
Chloride: 102 mmol/L (ref 98–111)
Creatinine: 1.21 mg/dL (ref 0.61–1.24)
GFR, Estimated: >60 mL/min (ref >60)
Glucose, Bld: 95 mg/dL (ref 70–99)
Potassium: 3.9 mmol/L (ref 3.5–5.1)
Sodium: 135 mmol/L (ref 135–145)
Total Bilirubin: 0.3 mg/dL (ref 0.3–1.2)
Total Protein: 6.4 g/dL — ABNORMAL LOW (ref 6.5–8.1)

## 2023-02-02 MED ORDER — APALUTAMIDE 60 MG PO TABS: 240.0000 mg | ORAL_TABLET | Freq: Every day | ORAL | 3 refills | Status: DC

## 2023-02-02 NOTE — Assessment & Plan Note (Addendum)
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 DEC 2023-multiple bone lesions noted.  DEC 2023-CT scan CAP- no visceral metastases. PSA rising at 65 [NOV 2023].  On apalutamide 4 pills a day [dissolved in water; mid NOV 2023]. DECLINED Zometa. Currently on apalutamide- AUG 19th, 2024- PSMA PET scan-  Intense radiotracer activity involving the sacrum and RIGHT iliac bone consistent with active skeletal metastasis.  No evidence of local prostate carcinoma recurrence in the prostate bed. No evidence of metastatic adenopathy in the pelvis or periaortic retroperitoneum; No evidence of visceral metastasis or pulmonary metastasis.   # Patient tolerating apalutamide 4 pills a day [dissolved in water]-fairly well. PSA- improving from [NOV 2023] 65; MAY  2024- PSA- 3.0; mots recently rising.  Continue Eligard q6 M + apalutamide.  Discussed again malignancy not curable; treatments are palliative.   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 Discussed that the median survival is 5 years ro so. However that has be in context of  given pt's reluctance with chemo/in general poor tolerance to chem. -consider for pluvicto in future. Check NGS blood work/tempus.   # Urinary obstruction -s/p TURP [OCT 2023; Dr.Brandon]- stable  # Dysphagia- [> 30 years-] ?  Esophageal spasms status post barium swallow. stable Continue follow-up with GI.   stable  #Parkinson's-[Dr.Potter]- weaned on sinemet. stable  # Social issues: patient wants to move back to [place], [place]. discussed with patient that it could be difficult for him to live alone given his limited performance status and also the incurable disease. Recommend holding off any move at this time.   *Eligard 45mg - May 15th, 2024;  [facility]  *transport- # DISPOSITION: # Check NGS/Tempus liquid biopsy today- # referral to Dr.Chrystal re: sacral metastasis  # follow up in 1 month- MD: labs- cbc/cmp; PSA-  Dr.B  # I reviewed the blood work- with the patient in detail; also reviewed the imaging independently [as summarized above]; and with the patient in detail.   # 40 minutes face-to-face with the patient discussing the above plan of care; more than 50% of time spent on prognosis/ natural history; counseling and coordination.

## 2023-02-02 NOTE — Telephone Encounter (Signed)
Obtained labs for liquid Tempus testing. Specimen carried to [employer] pick up in hospital.

## 2023-02-02 NOTE — Progress Notes (Signed)
[facility] OFFICE PROGRESS NOTE  Patient Care Team: [name], MD as PCP - General (Family Medicine) [name], MD as Consulting Physician (Oncology)   Cancer Staging  No matching staging information was found for the patient.    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  Patient was admitted to the ICU in [place].  During hospitalization patient was found to have elevated PSA -  962 and mri showed extensive sclerosis in pelvic, sacral, spine, and scapula, prostate cancer metastatic to bone. Creatinine was 2.94 without normalization. He had severe sepsis and bacteruria.   At discharge patient was sent over to rehab.  Given the family in [place]/brother-patient moved to [place].  Prior to this episode patient was losing weight.  However patient had been very healthy prior to this episode.  He was living alone.  Patient has chronic difficulty swallowing since age of 68.?  Esophagus spasms.     Prostate cancer metastatic to bone (HCC)  08/14/2021 Initial Diagnosis   Prostate cancer metastatic to bone (HCC)      HISTORY OF PRESENT ILLNESS: Patient in a rolling walker.   Patient lives in assisted living/[facility]; accompanied by his brother.  Trauis Gatchell 77 y.o.  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  No complaints of any pain. No blood in urine.Appetite is good. Walks  over a mile aday with his walker in the assissted living.  However gaining weight.  Appetite is improving.   Complains of mild numbness in the perineal region. No bowel or bladder incontinence.   Review of Systems  Constitutional:  Positive for malaise/fatigue and weight loss. Negative for chills, diaphoresis and fever.  HENT:  Negative for nosebleeds and sore throat.   Eyes:  Negative for double vision.  Respiratory:  Negative for cough, hemoptysis, sputum production, shortness of breath and wheezing.   Cardiovascular:  Negative for chest pain, palpitations, orthopnea and leg swelling.  Gastrointestinal:  Negative for abdominal pain, blood in stool, constipation, diarrhea, heartburn, melena, nausea and vomiting.  Genitourinary:  Negative for dysuria, frequency and urgency.  Musculoskeletal:  Negative for back pain and joint pain.  Skin: Negative.  Negative for itching and rash.  Neurological:  Positive for weakness. Negative for dizziness, tingling, focal weakness and headaches.  Endo/Heme/Allergies:  Does not bruise/bleed easily.  Psychiatric/Behavioral:  Negative for depression. The patient is not nervous/anxious and does not have insomnia.       PAST MEDICAL HISTORY :  Past Medical History:  Diagnosis Date   Acute kidney failure (HCC)    Arthritis    Asthma    Cancer (HCC)    Dysphagia    Parkinson disease    Prostate cancer (HCC)    Rhabdomyolysis     PAST SURGICAL HISTORY :   Past Surgical History:  Procedure Laterality Date   CARDIAC CATHETERIZATION     KNEE  ARTHROSCOPY Right 2012   also lt knee unsure of date   TRANSURETHRAL RESECTION OF PROSTATE N/A 03/17/2022   Procedure: TRANSURETHRAL RESECTION OF THE PROSTATE (TURP);  Surgeon: Vanna Scotland, MD;  Location: [facility] ORS;  Service: Urology;  Laterality: N/A;    FAMILY HISTORY :   Family History  Problem Relation Age of Onset   Arthritis/Rheumatoid Mother    Stroke Father    Hemangiomas Brother     SOCIAL  HISTORY:   Social History   Tobacco Use   Smoking status: Never   Smokeless tobacco: Never  Vaping Use   Vaping status: Never Used  Substance Use Topics   Alcohol use: Yes   Drug use: Never    ALLERGIES:  has No Known Allergies.  MEDICATIONS:  Current Outpatient Medications  Medication Sig Dispense Refill   acetaminophen (TYLENOL) 325 MG tablet Take 650 mg by mouth every 6 (six) hours as needed.     calcium-vitamin D (OSCAL WITH D) 500-5 MG-MCG tablet TAKE 1 TABLET BY MOUTH TWICE A DAY. 60 tablet 10   carbidopa-levodopa (SINEMET IR) 25-100 MG tablet Take 1 tablet by mouth 3 (three) times daily. 90 tablet 3   Leuprolide Acetate (ELIGARD [place]) Inject into the skin.     omeprazole (PRILOSEC) 20 MG capsule Take 1 capsule (20 mg total) by mouth every morning. 90 capsule 3   apalutamide ([name]) 60 MG tablet Take 4 tablets (240 mg total) by mouth daily. 120 tablet 3   No current facility-administered medications for this visit.    PHYSICAL EXAMINATION: ECOG PERFORMANCE STATUS: 2 - Symptomatic, <50% confined to bed  BP (!) 151/78 (BP Location: Right Arm, Patient Position: Sitting, Cuff Size: Normal)   Pulse 84   Temp 98.2 F (36.8 C) (Tympanic)   Ht 5\' 8"  (1.727 m)   Wt 139 lb (63 kg)   SpO2 93%   BMI 21.13 kg/m   Filed Weights   02/02/23 1016  Weight: 139 lb (63 kg)       Physical Exam Vitals and nursing note reviewed.  HENT:     Head: Normocephalic and atraumatic.     Mouth/Throat:     Pharynx: Oropharynx is clear.  Eyes:     Extraocular Movements: Extraocular movements intact.     Pupils: Pupils are equal, round, and reactive to light.  Cardiovascular:     Rate and Rhythm: Normal rate and regular rhythm.  Pulmonary:     Comments: Decreased breath sounds bilaterally.  Abdominal:     Palpations: Abdomen is soft.  Musculoskeletal:        General: Normal range of motion.     Cervical back: Normal range of motion.  Skin:    General: Skin is warm.   Neurological:     General: No focal deficit present.     Mental Status: He is alert and oriented to person, place, and time.  Psychiatric:        Behavior: Behavior normal.        Judgment: Judgment normal.     LABORATORY DATA:  I have reviewed the data as listed    Component Value Date/Time   NA 135 02/02/2023 0954   NA 140 07/31/2021 1612   K 3.9 02/02/2023 0954   CL 102 02/02/2023 0954   CO2 26 02/02/2023 0954   GLUCOSE 95 02/02/2023 0954   BUN 21 02/02/2023 0954   BUN 25 07/31/2021 1612   CREATININE 1.21 02/02/2023 0954   CALCIUM 9.2 02/02/2023 0954  PROT 6.4 (L) 02/02/2023 0954   PROT 6.5 07/31/2021 1612   ALBUMIN 3.9 02/02/2023 0954   ALBUMIN 4.3 07/31/2021 1612   AST 17 02/02/2023 0954   ALT 6 02/02/2023 0954   ALKPHOS 78 02/02/2023 0954   BILITOT 0.3 02/02/2023 0954   GFRNONAA >60 02/02/2023 0954    No results found for: "SPEP", "UPEP"  Lab Results  Component Value Date   WBC 5.9 02/02/2023   NEUTROABS 3.5 02/02/2023   HGB 13.1 02/02/2023   HCT 38.3 (L) 02/02/2023   MCV 89.5 02/02/2023   PLT 274 02/02/2023      Chemistry      Component Value Date/Time   NA 135 02/02/2023 0954   NA 140 07/31/2021 1612   K 3.9 02/02/2023 0954   CL 102 02/02/2023 0954   CO2 26 02/02/2023 0954   BUN 21 02/02/2023 0954   BUN 25 07/31/2021 1612   CREATININE 1.21 02/02/2023 0954      Component Value Date/Time   CALCIUM 9.2 02/02/2023 0954   ALKPHOS 78 02/02/2023 0954   AST 17 02/02/2023 0954   ALT 6 02/02/2023 0954   BILITOT 0.3 02/02/2023 0954       RADIOGRAPHIC STUDIES: I have personally reviewed the radiological images as listed and agreed with the findings in the report. No results found.   ASSESSMENT & PLAN:  Prostate cancer metastatic to bone 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 DEC 2023-multiple bone lesions noted.  DEC 2023-CT scan CAP- no visceral metastases. PSA  rising at 65 [NOV 2023].  On apalutamide 4 pills a day [dissolved in water; mid NOV 2023]. DECLINED Zometa. Currently on apalutamide- AUG 19th, 2024- PSMA PET scan-  Intense radiotracer activity involving the sacrum and RIGHT iliac bone consistent with active skeletal metastasis.  No evidence of local prostate carcinoma recurrence in the prostate bed. No evidence of metastatic adenopathy in the pelvis or periaortic retroperitoneum; No evidence of visceral metastasis or pulmonary metastasis.   # Patient tolerating apalutamide 4 pills a day [dissolved in water]-fairly well. PSA- improving from [NOV 2023] 65; MAY  2024- PSA- 3.0; mots recently rising.  Continue Eligard q6 M + apalutamide.  Discussed again malignancy not curable; treatments are palliative.   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 Discussed that the median survival is 5 years ro so. However that has be in context of  given pt's reluctance with chemo/in general poor tolerance to chem. -consider for pluvicto in future. Check NGS blood work/tempus.   # Urinary obstruction -s/p TURP [OCT 2023; Dr.Brandon]- stable  # Dysphagia- [> 30 years-] ?  Esophageal spasms status post barium swallow. stable Continue follow-up with GI.   stable  #Parkinson's-[Dr.Potter]- weaned on sinemet. stable  # Social issues: patient wants to move back to [place]. discussed with patient that it could be difficult for him to live alone given his limited performance status and also the incurable disease. Recommend holding off any move at this time.   *Eligard 45mg - May 15th, 2024;  [facility]  *transport- # DISPOSITION: # Check NGS/[facility] liquid biopsy today- # referral to Dr.Chrystal re: sacral metastasis  # follow up in 1 month- MD: labs- cbc/cmp; PSA- Dr.B  # I reviewed the blood work- with the patient in detail; also reviewed the imaging independently [as summarized above]; and with the patient in detail.   # 40 minutes face-to-face with the  patient discussing the above plan of  care; more than 50% of time spent on prognosis/ natural history; counseling and coordination.    Orders Placed This Encounter  Procedures   CBC with Differential (Cancer Center Only)    Standing Status:   Future    Standing Expiration Date:   02/02/2024   CMP (Cancer Center only)    Standing Status:   Future    Standing Expiration Date:   02/02/2024   PSA    Standing Status:   Future    Standing Expiration Date:   02/02/2024   Ambulatory referral to Radiation Oncology    Referral Priority:   Routine    Referral Type:   Consultation    Referral Reason:   Specialty Services Required    Requested Specialty:   Radiation Oncology    Number of Visits Requested:   1   All questions were answered. The patient knows to call the clinic with any problems, questions or concerns.      [name], MD 02/02/2023 1:36 PM

## 2023-02-02 NOTE — Progress Notes (Signed)
PET results from 01/26/23.  Refill [name], pending.

## 2023-02-04 ENCOUNTER — Telehealth: Payer: Self-pay

## 2023-02-11 ENCOUNTER — Ambulatory Visit
Admission: RE | Admit: 2023-02-11 | Discharge: 2023-02-11 | Disposition: A | Discharge status: Home / Self Care | Payer: Medicare Other | Source: Ambulatory Visit | Attending: Radiation Oncology | Admitting: Radiation Oncology

## 2023-02-11 ENCOUNTER — Encounter: Payer: Self-pay | Admitting: Radiation Oncology

## 2023-02-11 VITALS — BP 123/75 | HR 79 | Temp 97.7°F | Resp 12 | Wt 135.0 lb

## 2023-02-11 DIAGNOSIS — C61 Malignant neoplasm of prostate: Secondary | ICD-10-CM | POA: Diagnosis not present

## 2023-02-11 DIAGNOSIS — C7951 Secondary malignant neoplasm of bone: Secondary | ICD-10-CM | POA: Diagnosis not present

## 2023-02-11 DIAGNOSIS — N139 Obstructive and reflux uropathy, unspecified: Secondary | ICD-10-CM | POA: Insufficient documentation

## 2023-02-11 DIAGNOSIS — G20A1 Parkinson's disease without dyskinesia, without mention of fluctuations: Secondary | ICD-10-CM | POA: Insufficient documentation

## 2023-02-11 DIAGNOSIS — R1310 Dysphagia, unspecified: Secondary | ICD-10-CM | POA: Diagnosis not present

## 2023-02-11 DIAGNOSIS — Z510 Encounter for antineoplastic radiation therapy: Secondary | ICD-10-CM | POA: Diagnosis present

## 2023-02-11 DIAGNOSIS — Z923 Personal history of irradiation: Secondary | ICD-10-CM | POA: Insufficient documentation

## 2023-02-11 NOTE — Consult Note (Signed)
NEW PATIENT EVALUATION  Name: Philip [name]  MRN: [id number]  Date:   02/11/2023     DOB: Aug 15, 1945   This 77 y.o. 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  REFERRING PHYSICIAN: [name], MD  CHIEF COMPLAINT: No chief complaint on file.   DIAGNOSIS: The encounter diagnosis was Prostate cancer metastatic to bone [facility]).   PREVIOUS INVESTIGATIONS:  PSMA PET scan reviewed Clinical notes reviewed Labs reviewed  HPI: Patient is a 77 year old male diagnosed back in December 2022 with castrate resistant prostate cancer.  He has been started on APALUTAMIDE his PSA on presentation was 962.  He had extensive metastatic disease pelvis sacrum spine and scapula.  He recently moved to [facility] to be with his family.  He was seeing Dr. Leonard Schwartz underwent a PSMA PET scan showing intense radiotracer activity in the sacrum and right iliac bone consistent with active skeletal metastasis.  No other evidence of metastatic disease is noted.  He uses a walker for ambulatory assistance.  He is having no focal neurologic deficits.  He is in very little pain which is unusual since the activity in his sacrum is so intense.  PLANNED TREATMENT REGIMEN: Palliative radiation therapy  PAST MEDICAL HISTORY:  has a past medical history of Acute kidney failure (HCC), Arthritis, Asthma, Cancer (HCC), Dysphagia, Parkinson disease, Prostate cancer (HCC), and Rhabdomyolysis.    PAST SURGICAL HISTORY:  Past Surgical History:  Procedure Laterality Date   CARDIAC CATHETERIZATION     KNEE ARTHROSCOPY Right 2012   also lt knee unsure of date   TRANSURETHRAL RESECTION OF PROSTATE N/A 03/17/2022   Procedure: TRANSURETHRAL RESECTION OF THE PROSTATE (TURP);  Surgeon: Vanna Scotland, MD;  Location: [facility] ORS;  Service: Urology;  Laterality: N/A;    FAMILY HISTORY: family history includes Arthritis/Rheumatoid in his  mother; Hemangiomas in his brother; Stroke in his father.  SOCIAL HISTORY:  reports that he has never smoked. He has never used smokeless tobacco. He reports current alcohol use. He reports that he does not use drugs.  ALLERGIES: Patient has no known allergies.  MEDICATIONS:  Current Outpatient Medications  Medication Sig Dispense Refill   acetaminophen (TYLENOL) 325 MG tablet Take 650 mg by mouth every 6 (six) hours as needed.     apalutamide ([name]) 60 MG tablet Take 4 tablets (240 mg total) by mouth daily. 120 tablet 3   calcium-vitamin D (OSCAL WITH D) 500-5 MG-MCG tablet TAKE 1 TABLET BY MOUTH TWICE A DAY. 60 tablet 10   carbidopa-levodopa (SINEMET IR) 25-100 MG tablet Take 1 tablet by mouth 3 (three) times daily. 90 tablet 3   Leuprolide Acetate (ELIGARD [place]) Inject into the skin.     omeprazole (PRILOSEC) 20 MG capsule Take 1 capsule (20 mg total) by mouth every morning. 90 capsule 3   No current facility-administered medications for this encounter.    ECOG PERFORMANCE STATUS:  0 - Asymptomatic  REVIEW OF SYSTEMS: Patient has weight issues secondary to longstanding history of dysphagia.  Fatigue, weakness, fever, chills or night sweats. Patient denies any loss of vision, blurred vision. Patient denies any ringing  of the ears or hearing loss. No irregular heartbeat. Patient denies heart murmur or history of fainting. Patient denies any chest pain or pain radiating to her upper extremities. Patient denies any shortness of breath, difficulty breathing at night, cough or hemoptysis. Patient denies any swelling in the lower legs. Patient denies any  nausea vomiting, vomiting of blood, or coffee ground material in the vomitus. Patient denies any stomach pain. Patient states has had normal bowel movements no significant constipation or diarrhea. Patient denies any dysuria, hematuria or significant nocturia. Patient denies any problems walking, swelling in the joints or loss of balance. Patient  denies any skin changes, loss of hair or loss of weight. Patient denies any excessive worrying or anxiety or significant depression. Patient denies any problems with insomnia. Patient denies excessive thirst, polyuria, polydipsia. Patient denies any swollen glands, patient denies easy bruising or easy bleeding. Patient denies any recent infections, allergies or URI. Patient "s visual fields have not changed significantly in recent time.   PHYSICAL EXAM: BP 123/75   Pulse 79   Temp 97.7 F (36.5 C) (Tympanic)   Resp 12   Wt 135 lb (61.2 kg)   BMI 20.53 kg/m  Frail-appearing elderly male in NAD.  Range of motion of his lower extremities does not elicit pain deep palpation of his sacrum does not elicit pain motor and sensory levels are equal symmetric in the lower extremities.  Proprioception is intact.  Well-developed well-nourished patient in NAD. HEENT reveals PERLA, EOMI, discs not visualized.  Oral cavity is clear. No oral mucosal lesions are identified. Neck is clear without evidence of cervical or supraclavicular adenopathy. Lungs are clear to A&P. Cardiac examination is essentially unremarkable with regular rate and rhythm without murmur rub or thrill. Abdomen is benign with no organomegaly or masses noted. Motor sensory and DTR levels are equal and symmetric in the upper and lower extremities. Cranial nerves II through XII are grossly intact. Proprioception is intact. No peripheral adenopathy or edema is identified. No motor or sensory levels are noted. Crude visual fields are within normal range.  LABORATORY DATA: Labs reviewed    RADIOLOGY RESULTS: PSMA PET scan reviewed compatible with above-stated findings   IMPRESSION: Metastatic disease to the sacrum and patient with known castrate resistant prostate cancer in 77 year old male  PLAN: At this time I have offered palliative radiation therapy to his sacrum.  Based on the large area involved would plan on delivering 30 Gray in 10  fractions.  Would use a hybrid plan to stay off his bowel is much as possible.  Risks and benefits of treatment including possibility of diarrhea fatigue alteration of blood counts skin reaction all were reviewed with the patient.  I have personally 7 ordered CT simulation for later this week.  Patient comprehends my recommendations well.  I would like to take this opportunity to thank you for allowing me to participate in the care of your patient.[name], MD

## 2023-02-12 ENCOUNTER — Ambulatory Visit
Admission: RE | Admit: 2023-02-12 | Discharge: 2023-02-12 | Disposition: A | Discharge status: Home / Self Care | Payer: Medicare Other | Source: Ambulatory Visit | Attending: Radiation Oncology | Admitting: Radiation Oncology

## 2023-02-12 ENCOUNTER — Other Ambulatory Visit: Payer: Self-pay | Admitting: Internal Medicine

## 2023-02-12 DIAGNOSIS — Z510 Encounter for antineoplastic radiation therapy: Secondary | ICD-10-CM | POA: Diagnosis not present

## 2023-02-12 MED ORDER — TRAMADOL HCL 50 MG PO TABS: 50.0000 mg | ORAL_TABLET | Freq: Three times a day (TID) | ORAL | 0 refills | Status: DC | PRN

## 2023-02-12 NOTE — Progress Notes (Signed)
Called in a script for tramadol.   GB

## 2023-02-13 ENCOUNTER — Other Ambulatory Visit: Payer: Self-pay | Admitting: Urology

## 2023-02-13 ENCOUNTER — Other Ambulatory Visit: Payer: Self-pay | Admitting: *Deleted

## 2023-02-13 DIAGNOSIS — C7951 Secondary malignant neoplasm of bone: Secondary | ICD-10-CM

## 2023-02-16 DIAGNOSIS — Z510 Encounter for antineoplastic radiation therapy: Secondary | ICD-10-CM | POA: Diagnosis not present

## 2023-02-18 ENCOUNTER — Ambulatory Visit: Admission: RE | Admit: 2023-02-18 | Payer: Medicare Other | Source: Ambulatory Visit

## 2023-02-18 DIAGNOSIS — Z510 Encounter for antineoplastic radiation therapy: Secondary | ICD-10-CM | POA: Diagnosis not present

## 2023-02-19 ENCOUNTER — Other Ambulatory Visit: Payer: Self-pay

## 2023-02-19 ENCOUNTER — Ambulatory Visit
Admission: RE | Admit: 2023-02-19 | Discharge: 2023-02-19 | Disposition: A | Discharge status: Home / Self Care | Payer: Medicare Other | Source: Ambulatory Visit | Attending: Radiation Oncology | Admitting: Radiation Oncology

## 2023-02-19 DIAGNOSIS — Z510 Encounter for antineoplastic radiation therapy: Secondary | ICD-10-CM | POA: Diagnosis not present

## 2023-02-19 LAB — RAD ONC ARIA SESSION SUMMARY
Course Elapsed Days: 0
Plan Fractions Treated to Date: 1
Plan Prescribed Dose Per Fraction: 3 Gy
Plan Total Fractions Prescribed: 10
Plan Total Prescribed Dose: 30 Gy
Reference Point Dosage Given to Date: 3 Gy
Reference Point Session Dosage Given: 3 Gy
Session Number: 1

## 2023-02-20 ENCOUNTER — Ambulatory Visit
Admission: RE | Admit: 2023-02-20 | Discharge: 2023-02-20 | Disposition: A | Discharge status: Home / Self Care | Payer: Medicare Other | Source: Ambulatory Visit | Attending: Radiation Oncology | Admitting: Radiation Oncology

## 2023-02-20 ENCOUNTER — Other Ambulatory Visit: Payer: Self-pay

## 2023-02-20 DIAGNOSIS — Z510 Encounter for antineoplastic radiation therapy: Secondary | ICD-10-CM | POA: Diagnosis not present

## 2023-02-20 LAB — RAD ONC ARIA SESSION SUMMARY
Course Elapsed Days: 1
Plan Fractions Treated to Date: 2
Plan Prescribed Dose Per Fraction: 3 Gy
Plan Total Fractions Prescribed: 10
Plan Total Prescribed Dose: 30 Gy
Reference Point Dosage Given to Date: 6 Gy
Reference Point Session Dosage Given: 3 Gy
Session Number: 2

## 2023-02-23 ENCOUNTER — Ambulatory Visit
Admission: RE | Admit: 2023-02-23 | Discharge: 2023-02-23 | Disposition: A | Discharge status: Home / Self Care | Payer: Medicare Other | Source: Ambulatory Visit | Attending: Radiation Oncology | Admitting: Radiation Oncology

## 2023-02-23 ENCOUNTER — Other Ambulatory Visit: Payer: Self-pay

## 2023-02-23 DIAGNOSIS — Z510 Encounter for antineoplastic radiation therapy: Secondary | ICD-10-CM | POA: Diagnosis not present

## 2023-02-23 LAB — RAD ONC ARIA SESSION SUMMARY
Course Elapsed Days: 4
Plan Fractions Treated to Date: 3
Plan Prescribed Dose Per Fraction: 3 Gy
Plan Total Fractions Prescribed: 10
Plan Total Prescribed Dose: 30 Gy
Reference Point Dosage Given to Date: 9 Gy
Reference Point Session Dosage Given: 3 Gy
Session Number: 3

## 2023-02-24 ENCOUNTER — Ambulatory Visit
Admission: RE | Admit: 2023-02-24 | Discharge: 2023-02-24 | Disposition: A | Discharge status: Home / Self Care | Payer: Medicare Other | Source: Ambulatory Visit | Attending: Radiation Oncology | Admitting: Radiation Oncology

## 2023-02-24 ENCOUNTER — Other Ambulatory Visit: Payer: Self-pay

## 2023-02-24 DIAGNOSIS — Z510 Encounter for antineoplastic radiation therapy: Secondary | ICD-10-CM | POA: Diagnosis not present

## 2023-02-24 LAB — RAD ONC ARIA SESSION SUMMARY
Course Elapsed Days: 5
Plan Fractions Treated to Date: 4
Plan Prescribed Dose Per Fraction: 3 Gy
Plan Total Fractions Prescribed: 10
Plan Total Prescribed Dose: 30 Gy
Reference Point Dosage Given to Date: 12 Gy
Reference Point Session Dosage Given: 3 Gy
Session Number: 4

## 2023-02-25 ENCOUNTER — Other Ambulatory Visit: Payer: Self-pay

## 2023-02-25 ENCOUNTER — Ambulatory Visit
Admission: RE | Admit: 2023-02-25 | Discharge: 2023-02-25 | Disposition: A | Discharge status: Home / Self Care | Payer: Medicare Other | Source: Ambulatory Visit | Attending: Radiation Oncology | Admitting: Radiation Oncology

## 2023-02-25 ENCOUNTER — Inpatient Hospital Stay: Payer: Medicare Other

## 2023-02-25 DIAGNOSIS — C7951 Secondary malignant neoplasm of bone: Secondary | ICD-10-CM | POA: Insufficient documentation

## 2023-02-25 DIAGNOSIS — R1310 Dysphagia, unspecified: Secondary | ICD-10-CM | POA: Insufficient documentation

## 2023-02-25 DIAGNOSIS — G20A1 Parkinson's disease without dyskinesia, without mention of fluctuations: Secondary | ICD-10-CM | POA: Insufficient documentation

## 2023-02-25 DIAGNOSIS — C61 Malignant neoplasm of prostate: Secondary | ICD-10-CM | POA: Insufficient documentation

## 2023-02-25 DIAGNOSIS — Z510 Encounter for antineoplastic radiation therapy: Secondary | ICD-10-CM | POA: Diagnosis not present

## 2023-02-25 DIAGNOSIS — N139 Obstructive and reflux uropathy, unspecified: Secondary | ICD-10-CM | POA: Insufficient documentation

## 2023-02-25 DIAGNOSIS — Z923 Personal history of irradiation: Secondary | ICD-10-CM | POA: Insufficient documentation

## 2023-02-25 LAB — CBC (CANCER CENTER ONLY)
HCT: 37.1 % — ABNORMAL LOW (ref 39.0–52.0)
Hemoglobin: 12.7 g/dL — ABNORMAL LOW (ref 13.0–17.0)
MCH: 30.4 pg (ref 26.0–34.0)
MCHC: 34.2 g/dL (ref 30.0–36.0)
MCV: 88.8 fL (ref 80.0–100.0)
Platelet Count: 311 10*3/uL (ref 150–400)
RBC: 4.18 MIL/uL — ABNORMAL LOW (ref 4.22–5.81)
RDW: 12.3 % (ref 11.5–15.5)
WBC Count: 6.4 10*3/uL (ref 4.0–10.5)
nRBC: 0 % (ref 0.0–0.2)

## 2023-02-25 LAB — RAD ONC ARIA SESSION SUMMARY
Course Elapsed Days: 6
Plan Fractions Treated to Date: 5
Plan Prescribed Dose Per Fraction: 3 Gy
Plan Total Fractions Prescribed: 10
Plan Total Prescribed Dose: 30 Gy
Reference Point Dosage Given to Date: 15 Gy
Reference Point Session Dosage Given: 3 Gy
Session Number: 5

## 2023-02-26 ENCOUNTER — Other Ambulatory Visit: Payer: Self-pay

## 2023-02-26 ENCOUNTER — Ambulatory Visit
Admission: RE | Admit: 2023-02-26 | Discharge: 2023-02-26 | Disposition: A | Discharge status: Home / Self Care | Payer: Medicare Other | Source: Ambulatory Visit | Attending: Radiation Oncology | Admitting: Radiation Oncology

## 2023-02-26 ENCOUNTER — Ambulatory Visit: Payer: Medicare Other | Admitting: Internal Medicine

## 2023-02-26 ENCOUNTER — Other Ambulatory Visit: Payer: Medicare Other

## 2023-02-26 DIAGNOSIS — Z510 Encounter for antineoplastic radiation therapy: Secondary | ICD-10-CM | POA: Diagnosis not present

## 2023-02-26 LAB — RAD ONC ARIA SESSION SUMMARY
Course Elapsed Days: 7
Plan Fractions Treated to Date: 6
Plan Prescribed Dose Per Fraction: 3 Gy
Plan Total Fractions Prescribed: 10
Plan Total Prescribed Dose: 30 Gy
Reference Point Dosage Given to Date: 18 Gy
Reference Point Session Dosage Given: 3 Gy
Session Number: 6

## 2023-02-27 ENCOUNTER — Other Ambulatory Visit: Payer: Self-pay

## 2023-02-27 ENCOUNTER — Ambulatory Visit
Admission: RE | Admit: 2023-02-27 | Discharge: 2023-02-27 | Disposition: A | Discharge status: Home / Self Care | Payer: Medicare Other | Source: Ambulatory Visit | Attending: Radiation Oncology | Admitting: Radiation Oncology

## 2023-02-27 DIAGNOSIS — Z510 Encounter for antineoplastic radiation therapy: Secondary | ICD-10-CM | POA: Diagnosis not present

## 2023-02-27 LAB — RAD ONC ARIA SESSION SUMMARY
Course Elapsed Days: 8
Plan Fractions Treated to Date: 7
Plan Prescribed Dose Per Fraction: 3 Gy
Plan Total Fractions Prescribed: 10
Plan Total Prescribed Dose: 30 Gy
Reference Point Dosage Given to Date: 21 Gy
Reference Point Session Dosage Given: 3 Gy
Session Number: 7

## 2023-03-02 ENCOUNTER — Ambulatory Visit
Admission: RE | Admit: 2023-03-02 | Discharge: 2023-03-02 | Disposition: A | Discharge status: Home / Self Care | Payer: Medicare Other | Source: Ambulatory Visit | Attending: Radiation Oncology | Admitting: Radiation Oncology

## 2023-03-02 ENCOUNTER — Other Ambulatory Visit: Payer: Self-pay

## 2023-03-02 DIAGNOSIS — Z510 Encounter for antineoplastic radiation therapy: Secondary | ICD-10-CM | POA: Diagnosis not present

## 2023-03-02 LAB — RAD ONC ARIA SESSION SUMMARY
Course Elapsed Days: 11
Plan Fractions Treated to Date: 8
Plan Prescribed Dose Per Fraction: 3 Gy
Plan Total Fractions Prescribed: 10
Plan Total Prescribed Dose: 30 Gy
Reference Point Dosage Given to Date: 24 Gy
Reference Point Session Dosage Given: 3 Gy
Session Number: 8

## 2023-03-03 ENCOUNTER — Ambulatory Visit
Admission: RE | Admit: 2023-03-03 | Discharge: 2023-03-03 | Disposition: A | Discharge status: Home / Self Care | Payer: Medicare Other | Source: Ambulatory Visit | Attending: Radiation Oncology | Admitting: Radiation Oncology

## 2023-03-03 ENCOUNTER — Other Ambulatory Visit: Payer: Self-pay

## 2023-03-03 DIAGNOSIS — Z510 Encounter for antineoplastic radiation therapy: Secondary | ICD-10-CM | POA: Diagnosis not present

## 2023-03-03 LAB — RAD ONC ARIA SESSION SUMMARY
Course Elapsed Days: 12
Plan Fractions Treated to Date: 9
Plan Prescribed Dose Per Fraction: 3 Gy
Plan Total Fractions Prescribed: 10
Plan Total Prescribed Dose: 30 Gy
Reference Point Dosage Given to Date: 27 Gy
Reference Point Session Dosage Given: 3 Gy
Session Number: 9

## 2023-03-04 ENCOUNTER — Ambulatory Visit
Admission: RE | Admit: 2023-03-04 | Discharge: 2023-03-04 | Disposition: A | Discharge status: Home / Self Care | Payer: Medicare Other | Source: Ambulatory Visit | Attending: Radiation Oncology | Admitting: Radiation Oncology

## 2023-03-04 ENCOUNTER — Other Ambulatory Visit: Payer: Self-pay

## 2023-03-04 DIAGNOSIS — Z510 Encounter for antineoplastic radiation therapy: Secondary | ICD-10-CM | POA: Diagnosis not present

## 2023-03-04 LAB — RAD ONC ARIA SESSION SUMMARY
Course Elapsed Days: 13
Plan Fractions Treated to Date: 10
Plan Prescribed Dose Per Fraction: 3 Gy
Plan Total Fractions Prescribed: 10
Plan Total Prescribed Dose: 30 Gy
Reference Point Dosage Given to Date: 30 Gy
Reference Point Session Dosage Given: 3 Gy
Session Number: 10

## 2023-03-04 NOTE — Progress Notes (Unsigned)
[facility] OFFICE PROGRESS NOTE  Patient Care Team: [name], MD as PCP - General (Family Medicine) [name], MD as Consulting Physician (Oncology) [name], MD as Consulting Physician (Radiation Oncology)   Cancer Staging  No matching staging information was found for the patient.    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 s/p Radiation [finished 25th, SEP 2024]-  # n Christmas Day 2022 he fell and was no found for a couple of days and was found to be dehydrated and hypothermic.  Patient was admitted to the ICU in [place].  During hospitalization patient was found to have elevated PSA -  962 and mri showed extensive sclerosis in pelvic, sacral, spine, and scapula, prostate cancer metastatic to bone. Creatinine was 2.94 without normalization. He had severe sepsis and bacteruria.   At discharge patient was sent over to rehab.  Given the family in [place]/brother-patient moved to [place].  Prior to this episode patient was losing weight.  However patient had been very healthy prior to this episode.  He was living alone.  Patient has chronic difficulty swallowing since age of 61.?  Esophagus spasms.     Prostate cancer metastatic to bone (HCC)  08/14/2021 Initial Diagnosis   Prostate cancer metastatic to bone (HCC)      HISTORY OF PRESENT ILLNESS: Patient in a rolling walker.   Patient lives in assisted living/[facility]; accompanied by his brother.  Tulio Syracuse 77 y.o.  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   Patient s/p Radiation to sacral area. Doing well pain. No pain.   Appetite is good. Walks over a mile aday with his walker in the assissted living.  However gaining weight.  Appetite is improving.   Complains of mild numbness in the perineal region. No bowel or bladder incontinence.   Review of Systems  Constitutional:  Positive for malaise/fatigue and weight loss. Negative for chills, diaphoresis and fever.  HENT:  Negative for nosebleeds and sore throat.   Eyes:  Negative for double vision.  Respiratory:  Negative for cough, hemoptysis, sputum production, shortness of breath and wheezing.   Cardiovascular:  Negative for chest pain, palpitations, orthopnea and leg swelling.  Gastrointestinal:  Negative for abdominal pain, blood in stool, constipation, diarrhea, heartburn, melena, nausea and vomiting.  Genitourinary:  Negative for dysuria, frequency and urgency.  Musculoskeletal:  Negative for back pain and joint pain.  Skin: Negative.  Negative for itching and rash.  Neurological:  Positive for weakness. Negative for dizziness, tingling, focal weakness and headaches.  Endo/Heme/Allergies:  Does not bruise/bleed easily.  Psychiatric/Behavioral:  Negative for depression. The patient is not nervous/anxious and does not have insomnia.       PAST MEDICAL HISTORY :  Past Medical History:  Diagnosis Date   Acute kidney failure (HCC)    Arthritis    Asthma    Cancer (HCC)    Dysphagia    Parkinson disease  Prostate cancer (HCC)    Rhabdomyolysis     PAST SURGICAL HISTORY :   Past Surgical History:  Procedure Laterality Date   CARDIAC CATHETERIZATION     KNEE ARTHROSCOPY Right 2012   also lt knee unsure of date   TRANSURETHRAL RESECTION OF PROSTATE N/A 03/17/2022   Procedure: TRANSURETHRAL RESECTION OF THE PROSTATE (TURP);  Surgeon: Vanna Scotland, MD;  Location: [facility] ORS;  Service: Urology;  Laterality: N/A;    FAMILY HISTORY :   Family History  Problem Relation Age of Onset   Arthritis/Rheumatoid Mother    Stroke Father    Hemangiomas Brother     SOCIAL HISTORY:   Social History   Tobacco Use   Smoking status: Never   Smokeless tobacco: Never  Vaping Use   Vaping status: Never Used  Substance Use Topics   Alcohol use: Yes   Drug use: Never    ALLERGIES:  has No Known Allergies.  MEDICATIONS:  Current Outpatient Medications  Medication Sig Dispense Refill   acetaminophen (TYLENOL) 325 MG tablet Take 650 mg by mouth every 6 (six) hours as needed.     apalutamide ([name]) 60 MG tablet Take 4 tablets (240 mg total) by mouth daily. 120 tablet 3   calcium-vitamin D (OSCAL WITH D) 500-5 MG-MCG tablet TAKE 1 TABLET BY MOUTH TWICE A DAY. 60 tablet 10   carbidopa-levodopa (SINEMET IR) 25-100 MG tablet Take 1 tablet by mouth 3 (three) times daily. (Patient taking differently: Take 1 tablet by mouth 3 (three) times daily. One at breakfast, 2 at lunch and 2 in the pm) 90 tablet 3   Leuprolide Acetate (ELIGARD Holmes) Inject into the skin.     omeprazole (PRILOSEC) 20 MG capsule Take 1 capsule (20 mg total) by mouth every morning. 90 capsule 3   traMADol (ULTRAM) 50 MG tablet Take 1 tablet (50 mg total) by mouth every 8 (eight) hours as needed. 60 tablet 0   No current facility-administered medications for this visit.    PHYSICAL EXAMINATION: ECOG PERFORMANCE STATUS: 2 - Symptomatic, <50% confined to bed  BP (!) 113/57 (BP Location: Left Arm, Patient Position: Sitting, Cuff Size: Small)   Pulse 81   Temp 98.2 F (36.8 C) (Tympanic)   Ht 5\' 8"  (1.727 m)   Wt 135 lb (61.2 kg)   SpO2 94%   BMI 20.53 kg/m   Filed Weights   03/05/23 0927  Weight: 135 lb (61.2 kg)        Physical Exam Vitals and nursing note reviewed.  HENT:     Head: Normocephalic and atraumatic.     Mouth/Throat:     Pharynx: Oropharynx  is clear.  Eyes:     Extraocular Movements: Extraocular movements intact.     Pupils: Pupils are equal, round, and reactive to light.  Cardiovascular:     Rate and Rhythm: Normal rate and regular rhythm.  Pulmonary:     Comments: Decreased breath sounds bilaterally.  Abdominal:     Palpations: Abdomen is soft.  Musculoskeletal:        General: Normal range of motion.     Cervical back: Normal range of motion.  Skin:    General: Skin is warm.  Neurological:     General: No focal deficit present.     Mental Status: He is alert and oriented to person, place, and time.  Psychiatric:        Behavior: Behavior normal.        Judgment: Judgment normal.  LABORATORY DATA:  I have reviewed the data as listed    Component Value Date/Time   NA 136 03/05/2023 0930   NA 140 07/31/2021 1612   K 3.8 03/05/2023 0930   CL 100 03/05/2023 0930   CO2 27 03/05/2023 0930   GLUCOSE 107 (H) 03/05/2023 0930   BUN 18 03/05/2023 0930   BUN 25 07/31/2021 1612   CREATININE 1.15 03/05/2023 0930   CALCIUM 9.2 03/05/2023 0930   PROT 6.9 03/05/2023 0930   PROT 6.5 07/31/2021 1612   ALBUMIN 4.1 03/05/2023 0930   ALBUMIN 4.3 07/31/2021 1612   AST 17 03/05/2023 0930   ALT 10 03/05/2023 0930   ALKPHOS 101 03/05/2023 0930   BILITOT 0.5 03/05/2023 0930   GFRNONAA >60 03/05/2023 0930    No results found for: "SPEP", "UPEP"  Lab Results  Component Value Date   WBC 4.9 03/05/2023   NEUTROABS 3.3 03/05/2023   HGB 13.0 03/05/2023   HCT 38.7 (L) 03/05/2023   MCV 90.2 03/05/2023   PLT 285 03/05/2023      Chemistry      Component Value Date/Time   NA 136 03/05/2023 0930   NA 140 07/31/2021 1612   K 3.8 03/05/2023 0930   CL 100 03/05/2023 0930   CO2 27 03/05/2023 0930   BUN 18 03/05/2023 0930   BUN 25 07/31/2021 1612   CREATININE 1.15 03/05/2023 0930      Component Value Date/Time   CALCIUM 9.2 03/05/2023 0930   ALKPHOS 101 03/05/2023 0930   AST 17 03/05/2023 0930   ALT 10 03/05/2023  0930   BILITOT 0.5 03/05/2023 0930       RADIOGRAPHIC STUDIES: I have personally reviewed the radiological images as listed and agreed with the findings in the report. No results found.   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 DEC 2023-multiple bone lesions noted.  DEC 2023-CT scan CAP- no visceral metastases. PSA rising at 65 [NOV 2023].  On apalutamide 4 pills a day [dissolved in water; mid NOV 2023]. DECLINED Zometa. Currently on apalutamide- AUG 19th, 2024- PSMA PET scan-  Intense radiotracer activity involving the sacrum and RIGHT iliac bone consistent with active skeletal metastasis.  No evidence of local prostate carcinoma recurrence in the prostate bed. No evidence of metastatic adenopathy in the pelvis or periaortic retroperitoneum; No evidence of visceral metastasis or pulmonary metastasis. NGS blood work/tempus-   # Patient tolerating apalutamide 4 pills a day [dissolved in water]-fairly well. PSA- [NOV 2023] 65; AUG  2024- PSA- 30.0 rising-  Continue Eligard q6 M + apalutamide. Will assess PSA response. Continue apalutamide at this time. consider for pluvicto in future.  # AUG PET scan 2024-: Intense radiotracer activity involving the sacrum and RIGHT iliac bone consistent with active skeletal metastasis. s/p Radiation [finished 25th, SEP 2024]- monitor for now.   # Urinary obstruction -s/p TURP [OCT 2023; Dr.Brandon]- stable  # Dysphagia- [> 30 years-] ?  Esophageal spasms status post barium swallow. stable Continue follow-up with GI.   stable  #Parkinson's-[Dr.Potter]- weaned on sinemet.  stable  # Social issues: patient wants to move back to [place], [place]. discussed with patient that it could be difficult for him to live alone given his limited performance status and also the incurable disease. Recommend holding off any move at this time.    *Eligard 45mg - May 15th, 2024;  [facility]  *  transport- # DISPOSITION: # follow up in 1 month= MD: labs- cbc/cmp; PSA- Dr.B     No orders of the defined types were placed in this encounter.  All questions were answered. The patient knows to call the clinic with any problems, questions or concerns.      [name], MD 03/05/2023 10:39 AM

## 2023-03-04 NOTE — Assessment & Plan Note (Signed)
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 DEC 2023-multiple bone lesions noted.  DEC 2023-CT scan CAP- no visceral metastases. PSA rising at 65 [NOV 2023].  On apalutamide 4 pills a day [dissolved in water; mid NOV 2023]. DECLINED Zometa. Currently on apalutamide- AUG 19th, 2024- PSMA PET scan-  Intense radiotracer activity involving the sacrum and RIGHT iliac bone consistent with active skeletal metastasis.  No evidence of local prostate carcinoma recurrence in the prostate bed. No evidence of metastatic adenopathy in the pelvis or periaortic retroperitoneum; No evidence of visceral metastasis or pulmonary metastasis.   # Patient tolerating apalutamide 4 pills a day [dissolved in water]-fairly well. PSA- improving from [NOV 2023] 65; MAY  2024- PSA- 3.0; mots recently rising.  Continue Eligard q6 M + apalutamide.  Discussed again malignancy not curable; treatments are palliative.   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 Discussed that the median survival is 5 years ro so. However that has be in context of  given pt's reluctance with chemo/in general poor tolerance to chem. -consider for pluvicto in future. Check NGS blood work/tempus.   # Urinary obstruction -s/p TURP [OCT 2023; Dr.Brandon]- stable  # Dysphagia- [> 30 years-] ?  Esophageal spasms status post barium swallow. stable Continue follow-up with GI.   stable  #Parkinson's-[Dr.Potter]- weaned on sinemet. stable  # Social issues: patient wants to move back to [place] discussed with patient that it could be difficult for him to live alone given his limited performance status and also the incurable disease. Recommend holding off any move at this time.   *Eligard 45mg - May 15th, 2024;  [facility]  *transport- # DISPOSITION: # Check NGS/[facility] liquid biopsy today- # referral to Dr.Chrystal re: sacral metastasis  # follow up in 1 month- MD: labs- cbc/cmp; PSA-  Dr.B  # I reviewed the blood work- with the patient in detail; also reviewed the imaging independently [as summarized above]; and with the patient in detail.   # 40 minutes face-to-face with the patient discussing the above plan of care; more than 50% of time spent on prognosis/ natural history; counseling and coordination.

## 2023-03-05 ENCOUNTER — Inpatient Hospital Stay (HOSPITAL_BASED_OUTPATIENT_CLINIC_OR_DEPARTMENT_OTHER): Payer: Medicare Other | Admitting: Internal Medicine

## 2023-03-05 ENCOUNTER — Encounter: Payer: Self-pay | Admitting: Internal Medicine

## 2023-03-05 ENCOUNTER — Inpatient Hospital Stay: Payer: Medicare Other

## 2023-03-05 VITALS — BP 113/57 | HR 81 | Temp 98.2°F | Ht 68.0 in | Wt 135.0 lb

## 2023-03-05 DIAGNOSIS — C61 Malignant neoplasm of prostate: Secondary | ICD-10-CM

## 2023-03-05 DIAGNOSIS — C7951 Secondary malignant neoplasm of bone: Secondary | ICD-10-CM | POA: Diagnosis not present

## 2023-03-05 DIAGNOSIS — Z510 Encounter for antineoplastic radiation therapy: Secondary | ICD-10-CM | POA: Diagnosis not present

## 2023-03-05 LAB — CBC WITH DIFFERENTIAL (CANCER CENTER ONLY)
Abs Immature Granulocytes: 0.03 10*3/uL (ref 0.00–0.07)
Basophils Absolute: 0.1 10*3/uL (ref 0.0–0.1)
Basophils Relative: 1 %
Eosinophils Absolute: 0.2 10*3/uL (ref 0.0–0.5)
Eosinophils Relative: 4 %
HCT: 38.7 % — ABNORMAL LOW (ref 39.0–52.0)
Hemoglobin: 13 g/dL (ref 13.0–17.0)
Immature Granulocytes: 1 %
Lymphocytes Relative: 17 %
Lymphs Abs: 0.8 10*3/uL (ref 0.7–4.0)
MCH: 30.3 pg (ref 26.0–34.0)
MCHC: 33.6 g/dL (ref 30.0–36.0)
MCV: 90.2 fL (ref 80.0–100.0)
Monocytes Absolute: 0.5 10*3/uL (ref 0.1–1.0)
Monocytes Relative: 11 %
Neutro Abs: 3.3 10*3/uL (ref 1.7–7.7)
Neutrophils Relative %: 66 %
Platelet Count: 285 10*3/uL (ref 150–400)
RBC: 4.29 MIL/uL (ref 4.22–5.81)
RDW: 12.3 % (ref 11.5–15.5)
WBC Count: 4.9 10*3/uL (ref 4.0–10.5)
nRBC: 0 % (ref 0.0–0.2)

## 2023-03-05 LAB — CMP (CANCER CENTER ONLY)
ALT: 10 U/L (ref 0–44)
AST: 17 U/L (ref 15–41)
Albumin: 4.1 g/dL (ref 3.5–5.0)
Alkaline Phosphatase: 101 U/L (ref 38–126)
Anion gap: 9 (ref 5–15)
BUN: 18 mg/dL (ref 8–23)
CO2: 27 mmol/L (ref 22–32)
Calcium: 9.2 mg/dL (ref 8.9–10.3)
Chloride: 100 mmol/L (ref 98–111)
Creatinine: 1.15 mg/dL (ref 0.61–1.24)
GFR, Estimated: >60 mL/min (ref >60)
Glucose, Bld: 107 mg/dL — ABNORMAL HIGH (ref 70–99)
Potassium: 3.8 mmol/L (ref 3.5–5.1)
Sodium: 136 mmol/L (ref 135–145)
Total Bilirubin: 0.5 mg/dL (ref 0.3–1.2)
Total Protein: 6.9 g/dL (ref 6.5–8.1)

## 2023-03-05 LAB — PSA: Prostatic Specific Antigen: 62.19 ng/mL — ABNORMAL HIGH (ref 0.00–4.00)

## 2023-03-05 NOTE — Progress Notes (Signed)
Letter from [place] about erleada, needing new eval for help paying for rx. Son states [facility] help last time with this.  Appetite is 50% normal, no supplements. Did have a tooth removed making it hard to chew.

## 2023-03-06 ENCOUNTER — Encounter: Payer: Self-pay | Admitting: Internal Medicine

## 2023-03-17 ENCOUNTER — Other Ambulatory Visit: Payer: Self-pay | Admitting: Internal Medicine

## 2023-03-17 DIAGNOSIS — C61 Malignant neoplasm of prostate: Secondary | ICD-10-CM

## 2023-04-01 ENCOUNTER — Ambulatory Visit: Payer: Medicare Other | Admitting: Internal Medicine

## 2023-04-01 ENCOUNTER — Other Ambulatory Visit: Payer: Medicare Other

## 2023-04-09 ENCOUNTER — Ambulatory Visit
Admission: RE | Admit: 2023-04-09 | Discharge: 2023-04-09 | Disposition: A | Discharge status: Home / Self Care | Payer: Medicare Other | Source: Ambulatory Visit | Attending: Radiation Oncology | Admitting: Radiation Oncology

## 2023-04-09 ENCOUNTER — Inpatient Hospital Stay: Payer: Medicare Other | Attending: Internal Medicine | Admitting: Nurse Practitioner

## 2023-04-09 ENCOUNTER — Encounter: Payer: Self-pay | Admitting: Nurse Practitioner

## 2023-04-09 ENCOUNTER — Other Ambulatory Visit: Payer: Medicare Other

## 2023-04-09 ENCOUNTER — Ambulatory Visit: Payer: Medicare Other | Admitting: Nurse Practitioner

## 2023-04-09 VITALS — BP 144/70 | HR 78 | Temp 98.6°F | Resp 19 | Wt 134.8 lb

## 2023-04-09 DIAGNOSIS — C61 Malignant neoplasm of prostate: Secondary | ICD-10-CM

## 2023-04-09 DIAGNOSIS — C7951 Secondary malignant neoplasm of bone: Secondary | ICD-10-CM

## 2023-04-09 DIAGNOSIS — Z192 Hormone resistant malignancy status: Secondary | ICD-10-CM | POA: Diagnosis not present

## 2023-04-09 DIAGNOSIS — K5900 Constipation, unspecified: Secondary | ICD-10-CM | POA: Diagnosis not present

## 2023-04-09 DIAGNOSIS — Z923 Personal history of irradiation: Secondary | ICD-10-CM | POA: Insufficient documentation

## 2023-04-09 DIAGNOSIS — R202 Paresthesia of skin: Secondary | ICD-10-CM | POA: Diagnosis not present

## 2023-04-09 LAB — CMP (CANCER CENTER ONLY)
ALT: 10 U/L (ref 0–44)
AST: 18 U/L (ref 15–41)
Albumin: 4 g/dL (ref 3.5–5.0)
Alkaline Phosphatase: 80 U/L (ref 38–126)
Anion gap: 9 (ref 5–15)
BUN: 18 mg/dL (ref 8–23)
CO2: 27 mmol/L (ref 22–32)
Calcium: 9.4 mg/dL (ref 8.9–10.3)
Chloride: 101 mmol/L (ref 98–111)
Creatinine: 1.02 mg/dL (ref 0.61–1.24)
GFR, Estimated: >60 mL/min (ref >60)
Glucose, Bld: 119 mg/dL — ABNORMAL HIGH (ref 70–99)
Potassium: 3.8 mmol/L (ref 3.5–5.1)
Sodium: 137 mmol/L (ref 135–145)
Total Bilirubin: 0.4 mg/dL (ref 0.3–1.2)
Total Protein: 6.6 g/dL (ref 6.5–8.1)

## 2023-04-09 LAB — CBC WITH DIFFERENTIAL (CANCER CENTER ONLY)
Abs Immature Granulocytes: 0.02 10*3/uL (ref 0.00–0.07)
Basophils Absolute: 0.1 10*3/uL (ref 0.0–0.1)
Basophils Relative: 1 %
Eosinophils Absolute: 0.2 10*3/uL (ref 0.0–0.5)
Eosinophils Relative: 4 %
HCT: 39 % (ref 39.0–52.0)
Hemoglobin: 13.3 g/dL (ref 13.0–17.0)
Immature Granulocytes: 0 %
Lymphocytes Relative: 20 %
Lymphs Abs: 1.1 10*3/uL (ref 0.7–4.0)
MCH: 30.9 pg (ref 26.0–34.0)
MCHC: 34.1 g/dL (ref 30.0–36.0)
MCV: 90.7 fL (ref 80.0–100.0)
Monocytes Absolute: 0.6 10*3/uL (ref 0.1–1.0)
Monocytes Relative: 11 %
Neutro Abs: 3.5 10*3/uL (ref 1.7–7.7)
Neutrophils Relative %: 64 %
Platelet Count: 276 10*3/uL (ref 150–400)
RBC: 4.3 MIL/uL (ref 4.22–5.81)
RDW: 13.2 % (ref 11.5–15.5)
WBC Count: 5.5 10*3/uL (ref 4.0–10.5)
nRBC: 0 % (ref 0.0–0.2)

## 2023-04-09 LAB — PSA: Prostatic Specific Antigen: 20.94 ng/mL — ABNORMAL HIGH (ref 0.00–4.00)

## 2023-04-09 NOTE — Progress Notes (Signed)
Radiation Oncology Follow up Note  Name: Philip [name]   Date:   04/09/2023 MRN:  [id number] DOB: 1946-03-17    This 77 y.o. male presents to the clinic today for 1 month follow-up status post palliative radiation therapy to his sacrum for stage IV castrate resistant prostate cancer.  REFERRING PROVIDER: [name], MD  HPI: The patient, a 77 year old individual with castrate-resistant prostate cancer with metastasis to the sacrum, has recently completed radiation therapy. He reports a significant improvement in the pain in the sacrum, which was the primary concern. He is currently on Eligard and alpultamide, which he is tolerating well.  In addition to the pain, the patient has been experiencing a tingling sensation in his right foot. However, he reports no pain associated with this sensation. The patient also reports constipation, but no other new or worsening symptoms. He continues to receive treatment from another physician, Dr. Leonard Schwartz..  COMPLICATIONS OF TREATMENT: none  FOLLOW UP COMPLIANCE: keeps appointments   PHYSICAL EXAM:  There were no vitals taken for this visit. Motor or sensory and DTR levels are equal and symmetric in the lower extremities.  Palpation of the spine does not elicit pain.  Well-developed well-nourished patient in NAD. HEENT reveals PERLA, EOMI, discs not visualized.  Oral cavity is clear. No oral mucosal lesions are identified. Neck is clear without evidence of cervical or supraclavicular adenopathy. Lungs are clear to A&P. Cardiac examination is essentially unremarkable with regular rate and rhythm without murmur rub or thrill. Abdomen is benign with no organomegaly or masses noted. Motor sensory and DTR levels are equal and symmetric in the upper and lower extremities. Cranial nerves II through XII are grossly intact. Proprioception is intact. No peripheral adenopathy or edema is identified. No motor or sensory levels are noted. Crude visual fields are within  normal range.  RADIOLOGY RESULTS: No current films for review  PLAN: Metastatic Castrate Resistant Prostate Cancer Completed radiation therapy for metastatic disease to the sacrum. Currently on Eligard and Alpultamide with good tolerance. Pain in the sacral area has improved significantly. -Continue current treatment with Eligard and Alpultamide under the care of Dr. B.  Right Foot Tingling No associated pain or movement restriction. -Monitor symptoms.  Constipation Mild constipation reported. -Recommend increasing fluid and fiber intake.  Follow-up No immediate follow-up needed with this provider. Patient to continue care with Dr. Leonard Schwartz. If new painful areas develop, patient to be referred back for immediate care.    [name], MD

## 2023-04-09 NOTE — Progress Notes (Signed)
[facility] OFFICE PROGRESS NOTE  Patient Care Team: [name], MD as PCP - General (Family Medicine) [name], MD as Consulting Physician (Oncology) [name], MD as Consulting Physician (Radiation Oncology)   Cancer Staging  No matching staging information was found for the patient.  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 s/p Radiation [finished 25th, SEP 2024]-  # n Christmas Day 2022 he fell and was no found for a couple of days and was found to be dehydrated and hypothermic.  Patient was admitted to the ICU in [place].  During hospitalization patient was found to have elevated PSA -  962 and mri showed extensive sclerosis in pelvic, sacral, spine, and scapula, prostate cancer metastatic to bone. Creatinine was 2.94 without normalization. He had severe sepsis and bacteruria.   At discharge patient was sent over to rehab.  Given the family in [place]/brother-patient moved to [place].  Prior to this episode patient was losing weight.  However patient had been very healthy prior to this episode.  He was living alone.  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  Esophagus spasms.     Prostate cancer metastatic to bone (HCC)  08/14/2021 Initial Diagnosis   Prostate cancer metastatic to bone (HCC)      HISTORY OF PRESENT ILLNESS: Patient in a rolling walker. Patient lives in assisted living/[facility]. Accompanied by his brother.  Philip [name] 77 y.o. 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 He completed sacral radiation 03/04/23 and reports no pain in that area. He ambulating and feels better overall. He has some tingling in his foot but not impairing mobility. He's able to ambulate and is walking more. Denies hot flashes or new bone pain.    Review of Systems  Constitutional:  Positive for malaise/fatigue and weight loss. Negative for chills, diaphoresis and fever.  HENT:  Negative for nosebleeds and sore throat.   Respiratory:  Negative for cough, hemoptysis, sputum production, shortness of breath and wheezing.   Cardiovascular:  Negative for chest pain, palpitations, orthopnea and leg swelling.  Gastrointestinal:  Negative for abdominal pain, blood in stool, constipation, diarrhea, heartburn, melena, nausea and vomiting.  Genitourinary:  Negative for dysuria, frequency, hematuria and urgency.  Musculoskeletal:  Negative for back pain, falls and joint pain.  Skin: Negative.  Negative for itching and rash.  Neurological:  Positive for weakness. Negative for dizziness, tingling, focal weakness and headaches.  Endo/Heme/Allergies:  Does not bruise/bleed easily.  Psychiatric/Behavioral:  Negative for depression. The patient is not nervous/anxious and does not have insomnia.      PAST MEDICAL HISTORY :  Past Medical History:  Diagnosis Date   Acute kidney failure (HCC)    Arthritis    Asthma    Cancer (HCC)    Dysphagia    Parkinson disease (HCC)    Prostate cancer (HCC)    Rhabdomyolysis     PAST SURGICAL HISTORY :  Past Surgical History:  Procedure Laterality Date   CARDIAC CATHETERIZATION     KNEE ARTHROSCOPY Right 2012   also lt knee unsure of date   TRANSURETHRAL RESECTION OF PROSTATE N/A 03/17/2022   Procedure: TRANSURETHRAL RESECTION OF THE PROSTATE (TURP);  Surgeon: Vanna Scotland, MD;  Location: [facility] ORS;  Service: Urology;   Laterality: N/A;    FAMILY HISTORY :   Family History  Problem Relation Age of Onset   Arthritis/Rheumatoid Mother    Stroke Father    Hemangiomas Brother     SOCIAL HISTORY:   Social History   Tobacco Use   Smoking status: Never   Smokeless tobacco: Never  Vaping Use   Vaping status: Never Used  Substance Use Topics   Alcohol use: Yes   Drug use: Never    ALLERGIES:  has No Known Allergies.  MEDICATIONS:  Current Outpatient Medications  Medication Sig Dispense Refill   acetaminophen (TYLENOL) 325 MG tablet Take 650 mg by mouth every 6 (six) hours as needed.     calcium-vitamin D (OSCAL WITH D) 500-5 MG-MCG tablet TAKE 1 TABLET BY MOUTH TWICE A DAY. 60 tablet 10   carbidopa-levodopa (SINEMET IR) 25-100 MG tablet Take 1 tablet by mouth 3 (three) times daily. (Patient taking differently: Take 1 tablet by mouth 3 (three) times daily. One at breakfast, 2 at lunch and 2 in the pm) 90 tablet 3   [name] 60 MG tablet TAKE 4 TABLETS (240 MG TOTAL) BY MOUTH DAILY. 120 tablet 2   Leuprolide Acetate (ELIGARD [place]) Inject into the skin.     omeprazole (PRILOSEC) 20 MG capsule Take 1 capsule (20 mg total) by mouth every morning. 90 capsule 3   traMADol (ULTRAM) 50 MG tablet Take 1 tablet (50 mg total) by mouth every 8 (eight) hours as needed. 60 tablet 0   No current facility-administered medications for this visit.    PHYSICAL EXAMINATION: ECOG PERFORMANCE STATUS: 2 - Symptomatic, <50% confined to bed  BP (!) 144/70   Pulse 78   Temp 98.6 F (37 C)   Resp 19   Wt 134 lb 12.8 oz (61.1 kg)   SpO2 96%   BMI 20.50 kg/m   Filed Weights   04/09/23 0949  Weight: 134 lb 12.8 oz (61.1 kg)   Physical Exam Vitals reviewed.  Constitutional:      Appearance: He is not ill-appearing.  HENT:     Head: Normocephalic and atraumatic.     Mouth/Throat:     Pharynx: Oropharynx is clear.  Cardiovascular:     Rate and Rhythm: Normal rate and regular rhythm.  Pulmonary:     Effort: No  respiratory distress.     Comments: Decreased breath sounds bilaterally.  Abdominal:     Palpations: Abdomen is soft.  Musculoskeletal:        General: Normal range of motion.  Skin:    General: Skin is warm.  Neurological:     General: No focal deficit present.     Mental Status: He is alert and oriented to person, place, and time.  Psychiatric:        Behavior: Behavior normal.        Judgment: Judgment normal.    LABORATORY DATA:  I have reviewed the data as listed    Component Value Date/Time   NA 137 04/09/2023 0937   NA 140 07/31/2021 1612   K 3.8 04/09/2023 0937   CL 101 04/09/2023 0937   CO2 27 04/09/2023 1610  GLUCOSE 119 (H) 04/09/2023 0937   BUN 18 04/09/2023 0937   BUN 25 07/31/2021 1612   CREATININE 1.02 04/09/2023 0937   CALCIUM 9.4 04/09/2023 0937   PROT 6.6 04/09/2023 0937   PROT 6.5 07/31/2021 1612   ALBUMIN 4.0 04/09/2023 0937   ALBUMIN 4.3 07/31/2021 1612   AST 18 04/09/2023 0937   ALT 10 04/09/2023 0937   ALKPHOS 80 04/09/2023 0937   BILITOT 0.4 04/09/2023 0937   GFRNONAA >60 04/09/2023 0937   Lab Results  Component Value Date   WBC 5.5 04/09/2023   NEUTROABS 3.5 04/09/2023   HGB 13.3 04/09/2023   HCT 39.0 04/09/2023   MCV 90.7 04/09/2023   PLT 276 04/09/2023     Chemistry      Component Value Date/Time   NA 137 04/09/2023 0937   NA 140 07/31/2021 1612   K 3.8 04/09/2023 0937   CL 101 04/09/2023 0937   CO2 27 04/09/2023 0937   BUN 18 04/09/2023 0937   BUN 25 07/31/2021 1612   CREATININE 1.02 04/09/2023 0937      Component Value Date/Time   CALCIUM 9.4 04/09/2023 0937   ALKPHOS 80 04/09/2023 0937   AST 18 04/09/2023 0937   ALT 10 04/09/2023 0937   BILITOT 0.4 04/09/2023 0937       RADIOGRAPHIC STUDIES: I have personally reviewed the radiological images as listed and agreed with the findings in the report. No results found.   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 DEC 2023-multiple bone lesions noted.  DEC 2023-CT scan CAP- no visceral metastases. PSA rising at 65 [NOV 2023].  On apalutamide 4 pills a day [dissolved in water; mid NOV 2023]. DECLINED Zometa. Currently on apalutamide- AUG 19th, 2024- PSMA PET scan-  Intense radiotracer activity involving the sacrum and RIGHT iliac bone consistent with active skeletal metastasis.  No evidence of local prostate carcinoma recurrence in the prostate bed. No evidence of metastatic adenopathy in the pelvis or periaortic retroperitoneum; No evidence of visceral metastasis or pulmonary metastasis. NGS blood work/tempus: no pathogenic mutations detected. ctDNA < 0.25%. MSI not detected. TMB could not be calculated d/t insufficient ctDNA.    # Patient tolerating apalutamide 4 pills a day [dissolved in water]-fairly well. PSA- [NOV 2023] 65; AUG  2024- PSA- 30.0 rising-. Today, decreased to 21. Continue Eligard q6 M + apalutamide. Will continue to follow PSA for response. Consider for pluvicto in future.   # AUG PET scan 2024-: Intense radiotracer activity involving the sacrum and RIGHT iliac bone consistent with active skeletal metastasis. s/p Radiation [finished 25th, SEP 2024]- monitor for now.    # Urinary obstruction -s/p TURP [OCT 2023; Dr.Brandon]- stable   # Dysphagia- [> 30 years-] ?  Esophageal spasms status post barium swallow. Continue follow-up with GI. Stable   #Parkinson's- [Dr.Potter]- weaned on sinemet. Stable   # Social issues: patient wants to move back to [place], [place]. Previously discussed that given limited performance status and incurable nature of his disease, recommended holding of fon move.     *Eligard 45mg - May 15th, 2024;  [facility]   *transport-  # DISPOSITION: # 1 mo- lab (cbc, cmp, psa), Dr. Donneta Romberg- [place]  No problem-specific Assessment & Plan notes found for this encounter.  No orders of the defined  types were placed in this encounter.  All questions were answered. The  patient knows to call the clinic with any problems, questions or concerns.    Alinda Dooms, NP 04/09/2023

## 2023-04-14 ENCOUNTER — Encounter: Payer: Self-pay | Admitting: Internal Medicine

## 2023-04-14 NOTE — Telephone Encounter (Signed)
Signing encounter to close chart

## 2023-05-04 ENCOUNTER — Ambulatory Visit: Payer: Medicare Other | Admitting: Physician Assistant

## 2023-05-04 ENCOUNTER — Encounter: Payer: Self-pay | Admitting: Internal Medicine

## 2023-05-04 VITALS — BP 154/84 | HR 71

## 2023-05-04 DIAGNOSIS — Z87898 Personal history of other specified conditions: Secondary | ICD-10-CM

## 2023-05-04 DIAGNOSIS — Z09 Encounter for follow-up examination after completed treatment for conditions other than malignant neoplasm: Secondary | ICD-10-CM

## 2023-05-04 DIAGNOSIS — R339 Retention of urine, unspecified: Secondary | ICD-10-CM

## 2023-05-04 LAB — BLADDER SCAN AMB NON-IMAGING: Scan Result: 0

## 2023-05-04 NOTE — Progress Notes (Signed)
05/04/2023 11:37 AM   Philip [name] 1945/12/23 [id number]  CC: Chief Complaint  Patient presents with   Urinary Retention   Urinary Incontinence   HPI: Philip [name] is a 77 y.o. male with PMH metastatic castrate resistant prostate cancer managed by Dr. Donneta Romberg who recently completed palliative RT to the sacrum with Dr. Rushie Chestnut and urinary retention s/p TURP with Dr. Apolinar Junes in October 2023 who presents today for annual follow-up.   Today he reports he continues to void well without a catheter and has no acute urologic concerns.  He states he is "elated."  He does admit to nocturia x 2-3 but is minimally bothered by this and does not wish to pursue additional pharmacotherapy.  IPSS 9/delighted as below.  PVR 0 mL.   IPSS     Row Name 05/04/23 1100         International Prostate Symptom Score   How often have you had the sensation of not emptying your bladder? Not at All     How often have you had to urinate less than every two hours? More than half the time     How often have you found you stopped and started again several times when you urinated? Not at All     How often have you found it difficult to postpone urination? Not at All     How often have you had a weak urinary stream? Less than half the time     How often have you had to strain to start urination? Not at All     How many times did you typically get up at night to urinate? 3 Times     Total IPSS Score 9       Quality of Life due to urinary symptoms   If you were to spend the rest of your life with your urinary condition just the way it is now how would you feel about that? Delighted               PMH: Past Medical History:  Diagnosis Date   Acute kidney failure (HCC)    Arthritis    Asthma    Cancer (HCC)    Dysphagia    Parkinson disease (HCC)    Prostate cancer (HCC)    Rhabdomyolysis     Surgical History: Past Surgical History:  Procedure Laterality Date   CARDIAC CATHETERIZATION      KNEE ARTHROSCOPY Right 2012   also lt knee unsure of date   TRANSURETHRAL RESECTION OF PROSTATE N/A 03/17/2022   Procedure: TRANSURETHRAL RESECTION OF THE PROSTATE (TURP);  Surgeon: Vanna Scotland, MD;  Location: [facility] ORS;  Service: Urology;  Laterality: N/A;    Home Medications:  Allergies as of 05/04/2023   No Known Allergies      Medication List        Accurate as of May 04, 2023 11:37 AM. If you have any questions, ask your nurse or doctor.          acetaminophen 325 MG tablet Commonly known as: TYLENOL Take 650 mg by mouth every 6 (six) hours as needed.   calcium-vitamin D 500-5 MG-MCG tablet Commonly known as: OSCAL WITH D TAKE 1 TABLET BY MOUTH TWICE A DAY.   carbidopa-levodopa 25-100 MG tablet Commonly known as: SINEMET IR Take 1 tablet by mouth 3 (three) times daily. What changed: additional instructions   ELIGARD  Inject into the skin.   [name] 60 MG tablet Generic drug: apalutamide TAKE 4 TABLETS (  240 MG TOTAL) BY MOUTH DAILY.   omeprazole 20 MG capsule Commonly known as: PRILOSEC Take 1 capsule (20 mg total) by mouth every morning.   traMADol 50 MG tablet Commonly known as: ULTRAM Take 1 tablet (50 mg total) by mouth every 8 (eight) hours as needed.        Allergies:  No Known Allergies  Family History: Family History  Problem Relation Age of Onset   Arthritis/Rheumatoid Mother    Stroke Father    Hemangiomas Brother     Social History:   reports that he has never smoked. He has never used smokeless tobacco. He reports current alcohol use. He reports that he does not use drugs.  Physical Exam: BP (!) 154/84   Pulse 71   Constitutional:  Alert and oriented, no acute distress, nontoxic appearing HEENT: [place], AT Cardiovascular: No clubbing, cyanosis, or edema Respiratory: Normal respiratory effort, no increased work of breathing Skin: No rashes, bruises or suspicious lesions Neurologic: Grossly intact, no focal deficits,  moving all 4 extremities Psychiatric: Normal mood and affect  Laboratory Data: Results for orders placed or performed in visit on 05/04/23  Bladder Scan (Post Void Residual) in office  Result Value Ref Range   Scan Result 0 ml    Assessment & Plan:   1. Urinary retention Resolved following TURP.  He continues to empty completely with minimal bothersome voiding symptoms.  At this point, he may follow-up with [place] as needed.  I recommended continued close follow-up with Dr. Donneta Romberg for his prostate cancer.  He is in agreement with this plan. - Bladder Scan (Post Void Residual) in office   Return if symptoms worsen or fail to improve.  Carman Ching, PA-C  [facility] [place] [address], [address] [place], [place] [address] [phone]

## 2023-05-05 ENCOUNTER — Ambulatory Visit: Payer: Medicare Other | Admitting: Physician Assistant

## 2023-05-11 ENCOUNTER — Inpatient Hospital Stay: Payer: Medicare Other | Attending: Internal Medicine

## 2023-05-11 ENCOUNTER — Encounter: Payer: Self-pay | Admitting: Internal Medicine

## 2023-05-11 ENCOUNTER — Inpatient Hospital Stay (HOSPITAL_BASED_OUTPATIENT_CLINIC_OR_DEPARTMENT_OTHER): Payer: Medicare Other | Admitting: Internal Medicine

## 2023-05-11 VITALS — BP 139/75 | HR 82 | Temp 97.7°F | Ht 68.0 in | Wt 135.6 lb

## 2023-05-11 DIAGNOSIS — M129 Arthropathy, unspecified: Secondary | ICD-10-CM | POA: Diagnosis not present

## 2023-05-11 DIAGNOSIS — Z923 Personal history of irradiation: Secondary | ICD-10-CM | POA: Diagnosis not present

## 2023-05-11 DIAGNOSIS — R200 Anesthesia of skin: Secondary | ICD-10-CM | POA: Insufficient documentation

## 2023-05-11 DIAGNOSIS — J45909 Unspecified asthma, uncomplicated: Secondary | ICD-10-CM | POA: Diagnosis not present

## 2023-05-11 DIAGNOSIS — Z79899 Other long term (current) drug therapy: Secondary | ICD-10-CM | POA: Diagnosis not present

## 2023-05-11 DIAGNOSIS — Z9079 Acquired absence of other genital organ(s): Secondary | ICD-10-CM | POA: Insufficient documentation

## 2023-05-11 DIAGNOSIS — K224 Dyskinesia of esophagus: Secondary | ICD-10-CM | POA: Diagnosis not present

## 2023-05-11 DIAGNOSIS — Z192 Hormone resistant malignancy status: Secondary | ICD-10-CM | POA: Insufficient documentation

## 2023-05-11 DIAGNOSIS — G20A1 Parkinson's disease without dyskinesia, without mention of fluctuations: Secondary | ICD-10-CM | POA: Diagnosis not present

## 2023-05-11 DIAGNOSIS — R635 Abnormal weight gain: Secondary | ICD-10-CM | POA: Insufficient documentation

## 2023-05-11 DIAGNOSIS — C61 Malignant neoplasm of prostate: Secondary | ICD-10-CM

## 2023-05-11 DIAGNOSIS — N139 Obstructive and reflux uropathy, unspecified: Secondary | ICD-10-CM | POA: Insufficient documentation

## 2023-05-11 DIAGNOSIS — C7951 Secondary malignant neoplasm of bone: Secondary | ICD-10-CM | POA: Insufficient documentation

## 2023-05-11 DIAGNOSIS — R1310 Dysphagia, unspecified: Secondary | ICD-10-CM | POA: Insufficient documentation

## 2023-05-11 LAB — CMP (CANCER CENTER ONLY)
ALT: 6 U/L (ref 0–44)
AST: 15 U/L (ref 15–41)
Albumin: 3.9 g/dL (ref 3.5–5.0)
Alkaline Phosphatase: 79 U/L (ref 38–126)
Anion gap: 10 (ref 5–15)
BUN: 21 mg/dL (ref 8–23)
CO2: 28 mmol/L (ref 22–32)
Calcium: 9.1 mg/dL (ref 8.9–10.3)
Chloride: 100 mmol/L (ref 98–111)
Creatinine: 1.13 mg/dL (ref 0.61–1.24)
GFR, Estimated: >60 mL/min (ref >60)
Glucose, Bld: 107 mg/dL — ABNORMAL HIGH (ref 70–99)
Potassium: 3.8 mmol/L (ref 3.5–5.1)
Sodium: 138 mmol/L (ref 135–145)
Total Bilirubin: 0.5 mg/dL (ref <1.2)
Total Protein: 6.5 g/dL (ref 6.5–8.1)

## 2023-05-11 LAB — CBC WITH DIFFERENTIAL (CANCER CENTER ONLY)
Abs Immature Granulocytes: 0.03 10*3/uL (ref 0.00–0.07)
Basophils Absolute: 0.1 10*3/uL (ref 0.0–0.1)
Basophils Relative: 1 %
Eosinophils Absolute: 0.1 10*3/uL (ref 0.0–0.5)
Eosinophils Relative: 3 %
HCT: 40.1 % (ref 39.0–52.0)
Hemoglobin: 13.4 g/dL (ref 13.0–17.0)
Immature Granulocytes: 1 %
Lymphocytes Relative: 21 %
Lymphs Abs: 1 10*3/uL (ref 0.7–4.0)
MCH: 30.3 pg (ref 26.0–34.0)
MCHC: 33.4 g/dL (ref 30.0–36.0)
MCV: 90.7 fL (ref 80.0–100.0)
Monocytes Absolute: 0.6 10*3/uL (ref 0.1–1.0)
Monocytes Relative: 13 %
Neutro Abs: 2.9 10*3/uL (ref 1.7–7.7)
Neutrophils Relative %: 61 %
Platelet Count: 276 10*3/uL (ref 150–400)
RBC: 4.42 MIL/uL (ref 4.22–5.81)
RDW: 13.2 % (ref 11.5–15.5)
WBC Count: 4.8 10*3/uL (ref 4.0–10.5)
nRBC: 0 % (ref 0.0–0.2)

## 2023-05-11 LAB — PSA: Prostatic Specific Antigen: 11.94 ng/mL — ABNORMAL HIGH (ref 0.00–4.00)

## 2023-05-11 NOTE — Progress Notes (Signed)
No concerns/questions today.

## 2023-05-11 NOTE — Progress Notes (Signed)
[name] OFFICE PROGRESS NOTE  Patient Care Team: [name], MD as PCP - General (Family Medicine) [name], MD as Consulting Physician (Oncology) [name], MD as Consulting Physician (Radiation Oncology)   Cancer Staging  No matching staging information was found for the patient.    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 s/p Radiation [finished 25th, SEP 2024]-  # n Christmas Day 2022 he fell and was no found for a couple of days and was found to be dehydrated and hypothermic.  Patient was admitted to the ICU in [place].  During hospitalization patient was found to have elevated PSA -  962 and mri showed extensive sclerosis in pelvic, sacral, spine, and scapula, prostate cancer metastatic to bone. Creatinine was 2.94 without normalization. He had severe sepsis and bacteruria.   At discharge patient was sent over to rehab.  Given the family in [place]/brother-patient moved to [place].  Prior to this episode patient was losing weight.  However patient had been very healthy prior to this episode.  He was living alone.  Patient has chronic difficulty swallowing since age of 60.?  Esophagus spasms.     Prostate cancer metastatic to bone (HCC)  08/14/2021 Initial Diagnosis   Prostate cancer metastatic to bone (HCC)      HISTORY OF PRESENT ILLNESS: Patient in a rolling walker.   Patient lives in assisted living/[facility]; accompanied by his brother.  Philip [name] 77 y.o.  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   Patient s/p Radiation to sacral area. No pain.   Appetite is good. Walks over a mile aday with his walker in the assissted living.  However gaining weight.  Appetite is improving.   Complains of mild numbness in the perineal region. No bowel or bladder incontinence.   Review of Systems  Constitutional:  Positive for malaise/fatigue and weight loss. Negative for chills, diaphoresis and fever.  HENT:  Negative for nosebleeds and sore throat.   Eyes:  Negative for double vision.  Respiratory:  Negative for cough, hemoptysis, sputum production, shortness of breath and wheezing.   Cardiovascular:  Negative for chest pain, palpitations, orthopnea and leg swelling.  Gastrointestinal:  Negative for abdominal pain, blood in stool, constipation, diarrhea, heartburn, melena, nausea and vomiting.  Genitourinary:  Negative for dysuria, frequency and urgency.  Musculoskeletal:  Negative for back pain and joint pain.  Skin: Negative.  Negative for itching and rash.  Neurological:  Positive for weakness. Negative for dizziness, tingling, focal weakness and headaches.  Endo/Heme/Allergies:  Does not bruise/bleed easily.  Psychiatric/Behavioral:  Negative for depression. The patient is not nervous/anxious and does not have insomnia.       PAST MEDICAL HISTORY :  Past Medical History:  Diagnosis Date   Acute kidney failure (HCC)    Arthritis    Asthma    Cancer (HCC)    Dysphagia    Parkinson disease (HCC)  Prostate cancer (HCC)    Rhabdomyolysis     PAST SURGICAL HISTORY :   Past Surgical History:  Procedure Laterality Date   CARDIAC CATHETERIZATION     KNEE ARTHROSCOPY Right 2012   also lt knee unsure of date   TRANSURETHRAL RESECTION OF PROSTATE N/A 03/17/2022   Procedure: TRANSURETHRAL RESECTION OF THE PROSTATE (TURP);  Surgeon: Vanna Scotland, MD;   Location: [facility] ORS;  Service: Urology;  Laterality: N/A;    FAMILY HISTORY :   Family History  Problem Relation Age of Onset   Arthritis/Rheumatoid Mother    Stroke Father    Hemangiomas Brother     SOCIAL HISTORY:   Social History   Tobacco Use   Smoking status: Never   Smokeless tobacco: Never  Vaping Use   Vaping status: Never Used  Substance Use Topics   Alcohol use: Yes   Drug use: Never    ALLERGIES:  has No Known Allergies.  MEDICATIONS:  Current Outpatient Medications  Medication Sig Dispense Refill   acetaminophen (TYLENOL) 325 MG tablet Take 650 mg by mouth every 6 (six) hours as needed.     calcium-vitamin D (OSCAL WITH D) 500-5 MG-MCG tablet TAKE 1 TABLET BY MOUTH TWICE A DAY. 60 tablet 10   carbidopa-levodopa (SINEMET IR) 25-100 MG tablet Take 1 tablet by mouth 3 (three) times daily. (Patient taking differently: Take 1 tablet by mouth 3 (three) times daily. One at breakfast, 2 at lunch and 2 in the pm) 90 tablet 3   [name] 60 MG tablet TAKE 4 TABLETS (240 MG TOTAL) BY MOUTH DAILY. 120 tablet 2   Leuprolide Acetate (ELIGARD [place]) Inject into the skin.     omeprazole (PRILOSEC) 20 MG capsule Take 1 capsule (20 mg total) by mouth every morning. 90 capsule 3   traMADol (ULTRAM) 50 MG tablet Take 1 tablet (50 mg total) by mouth every 8 (eight) hours as needed. 60 tablet 0   No current facility-administered medications for this visit.    PHYSICAL EXAMINATION: ECOG PERFORMANCE STATUS: 2 - Symptomatic, <50% confined to bed  BP 139/75 (BP Location: Left Arm, Patient Position: Sitting, Cuff Size: Normal)   Pulse 82   Temp 97.7 F (36.5 C) (Tympanic)   Ht 5\' 8"  (1.727 m)   Wt 135 lb 9.6 oz (61.5 kg)   SpO2 96%   BMI 20.62 kg/m   Filed Weights   05/11/23 1018  Weight: 135 lb 9.6 oz (61.5 kg)        Physical Exam Vitals and nursing note reviewed.  HENT:     Head: Normocephalic and atraumatic.     Mouth/Throat:     Pharynx: Oropharynx is clear.   Eyes:     Extraocular Movements: Extraocular movements intact.     Pupils: Pupils are equal, round, and reactive to light.  Cardiovascular:     Rate and Rhythm: Normal rate and regular rhythm.  Pulmonary:     Comments: Decreased breath sounds bilaterally.  Abdominal:     Palpations: Abdomen is soft.  Musculoskeletal:        General: Normal range of motion.     Cervical back: Normal range of motion.  Skin:    General: Skin is warm.  Neurological:     General: No focal deficit present.     Mental Status: He is alert and oriented to person, place, and time.  Psychiatric:        Behavior: Behavior normal.        Judgment: Judgment  normal.     LABORATORY DATA:  I have reviewed the data as listed    Component Value Date/Time   NA 138 05/11/2023 0952   NA 140 07/31/2021 1612   K 3.8 05/11/2023 0952   CL 100 05/11/2023 0952   CO2 28 05/11/2023 0952   GLUCOSE 107 (H) 05/11/2023 0952   BUN 21 05/11/2023 0952   BUN 25 07/31/2021 1612   CREATININE 1.13 05/11/2023 0952   CALCIUM 9.1 05/11/2023 0952   PROT 6.5 05/11/2023 0952   PROT 6.5 07/31/2021 1612   ALBUMIN 3.9 05/11/2023 0952   ALBUMIN 4.3 07/31/2021 1612   AST 15 05/11/2023 0952   ALT 6 05/11/2023 0952   ALKPHOS 79 05/11/2023 0952   BILITOT 0.5 05/11/2023 0952   GFRNONAA >60 05/11/2023 0952    No results found for: "SPEP", "UPEP"  Lab Results  Component Value Date   WBC 4.8 05/11/2023   NEUTROABS 2.9 05/11/2023   HGB 13.4 05/11/2023   HCT 40.1 05/11/2023   MCV 90.7 05/11/2023   PLT 276 05/11/2023      Chemistry      Component Value Date/Time   NA 138 05/11/2023 0952   NA 140 07/31/2021 1612   K 3.8 05/11/2023 0952   CL 100 05/11/2023 0952   CO2 28 05/11/2023 0952   BUN 21 05/11/2023 0952   BUN 25 07/31/2021 1612   CREATININE 1.13 05/11/2023 0952      Component Value Date/Time   CALCIUM 9.1 05/11/2023 0952   ALKPHOS 79 05/11/2023 0952   AST 15 05/11/2023 0952   ALT 6 05/11/2023 0952   BILITOT 0.5  05/11/2023 0952       RADIOGRAPHIC STUDIES: I have personally reviewed the radiological images as listed and agreed with the findings in the report. No results found.   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 DEC 2023-multiple bone lesions noted.  DEC 2023-CT scan CAP- no visceral metastases. PSA rising at 65 [NOV 2023].  On apalutamide 4 pills a day [dissolved in water; mid NOV 2023]. DECLINED Zometa. Currently on apalutamide- AUG 19th, 2024- PSMA PET scan-  Intense radiotracer activity involving the sacrum and RIGHT iliac bone consistent with active skeletal metastasis.  No evidence of local prostate carcinoma recurrence in the prostate bed. No evidence of metastatic adenopathy in the pelvis or periaortic retroperitoneum; No evidence of visceral metastasis or pulmonary metastasis. NGS blood work/tempus-   # Patient tolerating apalutamide 4 pills a day [dissolved in water]-fairly well. PSA- [NOV 2023] 65; NOV  2024- PSA-200 rising-  Continue Eligard q6 M + apalutamide. Continue apalutamide at this time. consider for pluvicto in future.  # AUG PET scan 2024-: Intense radiotracer activity involving the sacrum and RIGHT iliac bone consistent with active skeletal metastasis. s/p Radiation [finished 25th, SEP 2024]- monitor for now- stable.   # Urinary obstruction -s/p TURP [OCT 2023; Dr.Brandon]- stable  # Dysphagia- [> 30 years-] ?  Esophageal spasms status post barium swallow. stable Continue follow-up with GI.   stable  #Parkinson's-[Dr.Potter]- weaned on sinemet.  stable  # Social issues: patient wants to move back to [place], [place]. discussed with patient that it could be difficult for him to live alone given his limited performance status and also the incurable disease. Recommend holding off any move at this time.   *Eligard 45mg - May 15th, 2024;  [facility]  *  transport- # DISPOSITION: # follow up in 6 weeks- MD: labs- cbc/cmp; PSA; Eligard-- Dr.B   Orders Placed This Encounter  Procedures   CBC with Differential (Cancer Center Only)    Standing Status:   Future    Standing Expiration Date:   05/10/2024   CMP (Cancer Center only)    Standing Status:   Future    Standing Expiration Date:   05/10/2024   PSA    Standing Status:   Future    Standing Expiration Date:   05/10/2024   All questions were answered. The patient knows to call the clinic with any problems, questions or concerns.      [name], MD 05/11/2023 11:14 AM

## 2023-05-11 NOTE — Assessment & Plan Note (Addendum)
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 DEC 2023-multiple bone lesions noted.  DEC 2023-CT scan CAP- no visceral metastases. PSA rising at 65 [NOV 2023].  On apalutamide 4 pills a day [dissolved in water; mid NOV 2023]. DECLINED Zometa. Currently on apalutamide- AUG 19th, 2024- PSMA PET scan-  Intense radiotracer activity involving the sacrum and RIGHT iliac bone consistent with active skeletal metastasis.  No evidence of local prostate carcinoma recurrence in the prostate bed. No evidence of metastatic adenopathy in the pelvis or periaortic retroperitoneum; No evidence of visceral metastasis or pulmonary metastasis. NGS blood work/tempus-   # Patient tolerating apalutamide 4 pills a day [dissolved in water]-fairly well. PSA- [NOV 2023] 65; NOV  2024- PSA-200 rising-  Continue Eligard q6 M + apalutamide. Continue apalutamide at this time. consider for pluvicto in future.  # AUG PET scan 2024-: Intense radiotracer activity involving the sacrum and RIGHT iliac bone consistent with active skeletal metastasis. s/p Radiation [finished 25th, SEP 2024]- monitor for now- stable.   # Urinary obstruction -s/p TURP [OCT 2023; Dr.Brandon]- stable  # Dysphagia- [> 30 years-] ?  Esophageal spasms status post barium swallow. stable Continue follow-up with GI.   stable  #Parkinson's-[Dr.Potter]- weaned on sinemet.  stable  # Social issues: patient wants to move back to [place] discussed with patient that it could be difficult for him to live alone given his limited performance status and also the incurable disease. Recommend holding off any move at this time.   *Eligard 45mg - May 15th, 2024;  [facility]  *transport- # DISPOSITION: # follow up in 6 weeks- MD: labs- cbc/cmp; PSA; Eligard-- Dr.B

## 2023-05-19 ENCOUNTER — Other Ambulatory Visit: Payer: Self-pay | Admitting: Pharmacist

## 2023-05-19 DIAGNOSIS — C7951 Secondary malignant neoplasm of bone: Secondary | ICD-10-CM

## 2023-05-20 ENCOUNTER — Encounter: Payer: Self-pay | Admitting: Internal Medicine

## 2023-05-20 MED ORDER — APALUTAMIDE 60 MG PO TABS: 240.0000 mg | ORAL_TABLET | Freq: Every day | ORAL | 2 refills | Status: DC

## 2023-05-20 NOTE — Telephone Encounter (Signed)
Patient's brother called 12/0/24 to requst refill be sent to assistance program. Returned call to patient's brother on 05/19/23 and let him know a refill request was sent to Dr. Angeliki Mates Schwartz. Refill signed by provider today

## 2023-05-26 ENCOUNTER — Other Ambulatory Visit: Payer: Self-pay | Admitting: Internal Medicine

## 2023-05-26 DIAGNOSIS — C7951 Secondary malignant neoplasm of bone: Secondary | ICD-10-CM

## 2023-05-29 ENCOUNTER — Other Ambulatory Visit: Payer: Self-pay | Admitting: Family Medicine

## 2023-06-16 ENCOUNTER — Telehealth: Payer: Self-pay

## 2023-06-16 NOTE — Telephone Encounter (Signed)
 Oral Oncology Patient Advocate Encounter   Received notification that the application for assistance for [name]  through [facility] & [facility] Patient [facility] has been approved.   [employer] phone number [phone].   Effective dates: 06/10/23 through 06/08/24  Medication will be filled at [facility].   Morene Potters, CPhT Oncology Pharmacy Patient Advocate  [facility]  [phone] (phone) [phone] (fax) 06/16/2023 3:56 PM

## 2023-06-22 ENCOUNTER — Inpatient Hospital Stay: Payer: Medicare Other | Attending: Internal Medicine

## 2023-06-22 ENCOUNTER — Inpatient Hospital Stay (HOSPITAL_BASED_OUTPATIENT_CLINIC_OR_DEPARTMENT_OTHER): Payer: Medicare Other | Admitting: Internal Medicine

## 2023-06-22 ENCOUNTER — Encounter: Payer: Self-pay | Admitting: Internal Medicine

## 2023-06-22 ENCOUNTER — Inpatient Hospital Stay: Payer: Medicare Other

## 2023-06-22 VITALS — BP 141/72 | HR 81 | Temp 98.1°F | Ht 68.0 in | Wt 136.6 lb

## 2023-06-22 DIAGNOSIS — C7951 Secondary malignant neoplasm of bone: Secondary | ICD-10-CM | POA: Insufficient documentation

## 2023-06-22 DIAGNOSIS — K224 Dyskinesia of esophagus: Secondary | ICD-10-CM | POA: Diagnosis not present

## 2023-06-22 DIAGNOSIS — Z5111 Encounter for antineoplastic chemotherapy: Secondary | ICD-10-CM | POA: Insufficient documentation

## 2023-06-22 DIAGNOSIS — R200 Anesthesia of skin: Secondary | ICD-10-CM | POA: Diagnosis not present

## 2023-06-22 DIAGNOSIS — Z923 Personal history of irradiation: Secondary | ICD-10-CM | POA: Insufficient documentation

## 2023-06-22 DIAGNOSIS — G20A1 Parkinson's disease without dyskinesia, without mention of fluctuations: Secondary | ICD-10-CM | POA: Insufficient documentation

## 2023-06-22 DIAGNOSIS — C61 Malignant neoplasm of prostate: Secondary | ICD-10-CM | POA: Insufficient documentation

## 2023-06-22 DIAGNOSIS — N139 Obstructive and reflux uropathy, unspecified: Secondary | ICD-10-CM | POA: Insufficient documentation

## 2023-06-22 LAB — CBC WITH DIFFERENTIAL (CANCER CENTER ONLY)
Abs Immature Granulocytes: 0.02 10*3/uL (ref 0.00–0.07)
Basophils Absolute: 0.1 10*3/uL (ref 0.0–0.1)
Basophils Relative: 1 %
Eosinophils Absolute: 0.1 10*3/uL (ref 0.0–0.5)
Eosinophils Relative: 1 %
HCT: 39.7 % (ref 39.0–52.0)
Hemoglobin: 13.6 g/dL (ref 13.0–17.0)
Immature Granulocytes: 0 %
Lymphocytes Relative: 17 %
Lymphs Abs: 1.1 10*3/uL (ref 0.7–4.0)
MCH: 30.8 pg (ref 26.0–34.0)
MCHC: 34.3 g/dL (ref 30.0–36.0)
MCV: 89.8 fL (ref 80.0–100.0)
Monocytes Absolute: 0.7 10*3/uL (ref 0.1–1.0)
Monocytes Relative: 11 %
Neutro Abs: 4.4 10*3/uL (ref 1.7–7.7)
Neutrophils Relative %: 70 %
Platelet Count: 258 10*3/uL (ref 150–400)
RBC: 4.42 MIL/uL (ref 4.22–5.81)
RDW: 13.1 % (ref 11.5–15.5)
WBC Count: 6.4 10*3/uL (ref 4.0–10.5)
nRBC: 0 % (ref 0.0–0.2)

## 2023-06-22 LAB — CMP (CANCER CENTER ONLY)
ALT: 5 U/L (ref 0–44)
AST: 17 U/L (ref 15–41)
Albumin: 4.2 g/dL (ref 3.5–5.0)
Alkaline Phosphatase: 68 U/L (ref 38–126)
Anion gap: 12 (ref 5–15)
BUN: 27 mg/dL — ABNORMAL HIGH (ref 8–23)
CO2: 26 mmol/L (ref 22–32)
Calcium: 9.7 mg/dL (ref 8.9–10.3)
Chloride: 99 mmol/L (ref 98–111)
Creatinine: 1.18 mg/dL (ref 0.61–1.24)
GFR, Estimated: >60 mL/min (ref >60)
Glucose, Bld: 119 mg/dL — ABNORMAL HIGH (ref 70–99)
Potassium: 3.9 mmol/L (ref 3.5–5.1)
Sodium: 137 mmol/L (ref 135–145)
Total Bilirubin: 0.4 mg/dL (ref 0.0–1.2)
Total Protein: 6.8 g/dL (ref 6.5–8.1)

## 2023-06-22 LAB — PSA: Prostatic Specific Antigen: 9.45 ng/mL — ABNORMAL HIGH (ref 0.00–4.00)

## 2023-06-22 MED ORDER — LEUPROLIDE ACETATE (6 MONTH) 45 MG ~~LOC~~ KIT
45.0000 mg | PACK | Freq: Once | SUBCUTANEOUS | Status: AC
  Administered 2023-06-22: 45 mg via SUBCUTANEOUS

## 2023-06-22 NOTE — Progress Notes (Signed)
 Having trouble swallowing certain foods and pills.

## 2023-06-22 NOTE — Assessment & Plan Note (Addendum)
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 DEC 2023-multiple bone lesions noted.  DEC 2023-CT scan CAP- no visceral metastases. PSA rising at 65 [NOV 2023].  On apalutamide  4 pills a day [dissolved in water; mid NOV 2023]. DECLINED Zometa . Currently on apalutamide - AUG 19th, 2024- PSMA PET scan-  Intense radiotracer activity involving the sacrum and RIGHT iliac bone consistent with active skeletal metastasis.  No evidence of local prostate carcinoma recurrence in the prostate bed. No evidence of metastatic adenopathy in the pelvis or periaortic retroperitoneum; No evidence of visceral metastasis or pulmonary metastasis. NGS blood work/tempus- AUG 2024-  O targets  # Patient tolerating apalutamide  4 pills a day [dissolved in water]-fairly well. NOV  2024- PSA-200; DEC 2024- PSA 11.9  Continue Eligard  q6 M + apalutamide . Continue apalutamide  at this time. consider for pluvicto in future.  # AUG PET scan 2024-: Intense radiotracer activity involving the sacrum and RIGHT iliac bone consistent with active skeletal metastasis. s/p Radiation [finished 25th, SEP 2024]- monitor for now- stable.   # Urinary obstruction -s/p TURP [OCT 2023; Dr.Brandon]- stable  # Dysphagia- [> 30 years-] ?  Esophageal spasms status post barium swallow. stable Continue follow-up with GI.   stable  #Parkinson's-[Dr.Potter]-  on sinemet .  stable  # Social issues: patient wants to move back to [place] discussed with patient that it could be difficult for him to live alone given his limited performance status and also the incurable disease. Recommend holding off any move at this time.   *Eligard  45mg - JAN 13th 2025;  [facility]  *transport- # DISPOSITION: # Eligard  today # follow up in 2 months MD: labs- cbc/cmp; PSA; bone scan prior- Dr.B

## 2023-06-22 NOTE — Progress Notes (Signed)
 [facility] OFFICE PROGRESS NOTE  Patient Care Team: Bertrum Charlie CROME, MD as PCP - General (Family Medicine) Rennie Cindy SAUNDERS, MD as Consulting Physician (Oncology) [name], MD as Consulting Physician (Radiation Oncology)   Cancer Staging  No matching staging information was found for the patient.    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 s/p Radiation [finished 25th, SEP 2024]-Continue Erleda  # n Christmas Day 2022 he fell and was no found for a couple of days and was found to be dehydrated and hypothermic.  Patient was admitted to the ICU in [place] .  During hospitalization patient was found to have elevated PSA -  962 and mri showed extensive sclerosis in pelvic, sacral, spine, and scapula, prostate cancer metastatic to bone. Creatinine was 2.94 without normalization. He had severe sepsis and bacteruria.   At discharge patient was sent over to rehab.  Given the family in [place]/brother-patient moved to [place] .  Prior to this episode patient was losing weight.  However patient had been very healthy prior to this episode.  He was living alone.  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  Esophagus spasms.     Prostate cancer metastatic to bone (HCC)  08/14/2021 Initial Diagnosis   Prostate cancer metastatic to bone (HCC)      HISTORY OF PRESENT ILLNESS: Patient in a rolling walker.   Patient lives in assisted living/[facility]; accompanied by his brother.  Mack Alvidrez 78 y.o.  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   Patient s/p Radiation to sacral area. No pain.   Appetite is good. Walks over a mile aday with his walker in the assissted living.  However gaining weight.  Appetite is improving.   Complains of mild numbness in the perineal region. No bowel or bladder incontinence.   Review of Systems  Constitutional:  Positive for malaise/fatigue and weight loss. Negative for chills, diaphoresis and fever.  HENT:  Negative for nosebleeds and sore throat.   Eyes:  Negative for double vision.  Respiratory:  Negative for cough, hemoptysis, sputum production, shortness of breath and wheezing.   Cardiovascular:  Negative for chest pain, palpitations, orthopnea and leg swelling.  Gastrointestinal:  Negative for abdominal pain, blood in stool, constipation, diarrhea, heartburn, melena, nausea and vomiting.  Genitourinary:  Negative for dysuria, frequency and urgency.  Musculoskeletal:  Negative for back pain and joint pain.  Skin: Negative.  Negative for itching and rash.  Neurological:  Positive for weakness. Negative for dizziness, tingling, focal weakness and headaches.  Endo/Heme/Allergies:  Does not bruise/bleed easily.  Psychiatric/Behavioral:  Negative for depression. The patient is not nervous/anxious and does not have insomnia.       PAST MEDICAL HISTORY :  Past Medical History:  Diagnosis Date   Acute kidney failure (HCC)    Arthritis    Asthma    Cancer (HCC)    Dysphagia    Parkinson disease (HCC)  Prostate cancer (HCC)    Rhabdomyolysis     PAST SURGICAL HISTORY :   Past Surgical History:  Procedure Laterality Date   CARDIAC CATHETERIZATION     KNEE ARTHROSCOPY Right 2012   also lt knee unsure of date   TRANSURETHRAL RESECTION OF PROSTATE N/A 03/17/2022   Procedure: TRANSURETHRAL RESECTION OF THE PROSTATE (TURP);  Surgeon:  Penne Knee, MD;  Location: [facility] ORS;  Service: Urology;  Laterality: N/A;    FAMILY HISTORY :   Family History  Problem Relation Age of Onset   Arthritis/Rheumatoid Mother    Stroke Father    Hemangiomas Brother     SOCIAL HISTORY:   Social History   Tobacco Use   Smoking status: Never   Smokeless tobacco: Never  Vaping Use   Vaping status: Never Used  Substance Use Topics   Alcohol use: Yes   Drug use: Never    ALLERGIES:  has no known allergies.  MEDICATIONS:  Current Outpatient Medications  Medication Sig Dispense Refill   acetaminophen  (TYLENOL ) 325 MG tablet Take 650 mg by mouth every 6 (six) hours as needed.     apalutamide  ([name] ) 60 MG tablet Take 4 tablets (240 mg total) by mouth daily. 120 tablet 2   calcium -vitamin D  (OSCAL WITH D) 500-5 MG-MCG tablet TAKE 1 TABLET BY MOUTH TWICE A DAY. 60 tablet 10   carbidopa -levodopa  (SINEMET  IR) 25-100 MG tablet Take 1 tablet by mouth 3 (three) times daily. (Patient taking differently: Take 1 tablet by mouth 3 (three) times daily. One at breakfast, 2 at lunch and 2 in the pm) 90 tablet 3   Leuprolide  Acetate (ELIGARD  Renova) Inject into the skin.     omeprazole  (PRILOSEC) 20 MG capsule Take 1 capsule (20 mg total) by mouth every morning. 90 capsule 3   traMADol  (ULTRAM ) 50 MG tablet Take 1 tablet (50 mg total) by mouth every 8 (eight) hours as needed. 60 tablet 0   No current facility-administered medications for this visit.    PHYSICAL EXAMINATION: ECOG PERFORMANCE STATUS: 2 - Symptomatic, <50% confined to bed  BP (!) 141/72 (BP Location: Left Arm, Patient Position: Sitting, Cuff Size: Normal)   Pulse 81   Temp 98.1 F (36.7 C) (Tympanic)   Ht 5' 8 (1.727 m)   Wt 136 lb 9.6 oz (62 kg)   SpO2 98%   BMI 20.77 kg/m   Filed Weights   06/22/23 1424  Weight: 136 lb 9.6 oz (62 kg)        Physical Exam Vitals and nursing note reviewed.  HENT:     Head: Normocephalic and atraumatic.     Mouth/Throat:      Pharynx: Oropharynx is clear.  Eyes:     Extraocular Movements: Extraocular movements intact.     Pupils: Pupils are equal, round, and reactive to light.  Cardiovascular:     Rate and Rhythm: Normal rate and regular rhythm.  Pulmonary:     Comments: Decreased breath sounds bilaterally.  Abdominal:     Palpations: Abdomen is soft.  Musculoskeletal:        General: Normal range of motion.     Cervical back: Normal range of motion.  Skin:    General: Skin is warm.  Neurological:     General: No focal deficit present.     Mental Status: He is alert and oriented to person, place, and time.  Psychiatric:        Behavior: Behavior normal.  Judgment: Judgment normal.     LABORATORY DATA:  I have reviewed the data as listed    Component Value Date/Time   NA 137 06/22/2023 1411   NA 140 07/31/2021 1612   K 3.9 06/22/2023 1411   CL 99 06/22/2023 1411   CO2 26 06/22/2023 1411   GLUCOSE 119 (H) 06/22/2023 1411   BUN 27 (H) 06/22/2023 1411   BUN 25 07/31/2021 1612   CREATININE 1.18 06/22/2023 1411   CALCIUM  9.7 06/22/2023 1411   PROT 6.8 06/22/2023 1411   PROT 6.5 07/31/2021 1612   ALBUMIN 4.2 06/22/2023 1411   ALBUMIN 4.3 07/31/2021 1612   AST 17 06/22/2023 1411   ALT 5 06/22/2023 1411   ALKPHOS 68 06/22/2023 1411   BILITOT 0.4 06/22/2023 1411   GFRNONAA >60 06/22/2023 1411    No results found for: SPEP, UPEP  Lab Results  Component Value Date   WBC 6.4 06/22/2023   NEUTROABS 4.4 06/22/2023   HGB 13.6 06/22/2023   HCT 39.7 06/22/2023   MCV 89.8 06/22/2023   PLT 258 06/22/2023      Chemistry      Component Value Date/Time   NA 137 06/22/2023 1411   NA 140 07/31/2021 1612   K 3.9 06/22/2023 1411   CL 99 06/22/2023 1411   CO2 26 06/22/2023 1411   BUN 27 (H) 06/22/2023 1411   BUN 25 07/31/2021 1612   CREATININE 1.18 06/22/2023 1411      Component Value Date/Time   CALCIUM  9.7 06/22/2023 1411   ALKPHOS 68 06/22/2023 1411   AST 17 06/22/2023 1411    ALT 5 06/22/2023 1411   BILITOT 0.4 06/22/2023 1411       RADIOGRAPHIC STUDIES: I have personally reviewed the radiological images as listed and agreed with the findings in the report. No results found.   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 DEC 2023-multiple bone lesions noted.  DEC 2023-CT scan CAP- no visceral metastases. PSA rising at 65 [NOV 2023].  On apalutamide  4 pills a day [dissolved in water; mid NOV 2023]. DECLINED Zometa . Currently on apalutamide - AUG 19th, 2024- PSMA PET scan-  Intense radiotracer activity involving the sacrum and RIGHT iliac bone consistent with active skeletal metastasis.  No evidence of local prostate carcinoma recurrence in the prostate bed. No evidence of metastatic adenopathy in the pelvis or periaortic retroperitoneum; No evidence of visceral metastasis or pulmonary metastasis. NGS blood work/tempus- AUG 2024-  O targets  # Patient tolerating apalutamide  4 pills a day [dissolved in water]-fairly well. NOV  2024- PSA-200; DEC 2024- PSA 11.9  Continue Eligard  q6 M + apalutamide . Continue apalutamide  at this time. consider for pluvicto in future.  # AUG PET scan 2024-: Intense radiotracer activity involving the sacrum and RIGHT iliac bone consistent with active skeletal metastasis. s/p Radiation [finished 25th, SEP 2024]- monitor for now- stable.   # Urinary obstruction -s/p TURP [OCT 2023; Dr.Brandon]- stable  # Dysphagia- [> 30 years-] ?  Esophageal spasms status post barium swallow. stable Continue follow-up with GI.   stable  #Parkinson's-[Dr.Potter]-  on sinemet .  stable  # Social issues: patient wants to move back to [place], [place]. discussed with patient that it could be difficult for him to live alone given his limited performance status and also the incurable disease. Recommend holding off any move at this time.    *Eligard  45mg -  JAN 13th 2025;  [facility]  *transport- # DISPOSITION: # Eligard  today # follow up in 2 months MD: labs- cbc/cmp; PSA; bone scan prior- Dr.B   Orders Placed This Encounter  Procedures   NM Bone Scan Whole Body    Standing Status:   Future    Expected Date:   08/20/2023    Expiration Date:   06/21/2024    If indicated for the ordered procedure, I authorize the administration of a radiopharmaceutical per Radiology protocol:   Yes    Preferred imaging location?:   [facility]    Radiology Contrast Protocol - do NOT remove file path:   [email or web address]   CBC with Differential (Cancer Center Only)    Standing Status:   Future    Expected Date:   08/20/2023    Expiration Date:   06/21/2024   CMP (Cancer Center only)    Standing Status:   Future    Expected Date:   08/20/2023    Expiration Date:   06/21/2024   PSA    Standing Status:   Future    Expected Date:   08/20/2023    Expiration Date:   06/21/2024   All questions were answered. The patient knows to call the clinic with any problems, questions or concerns.      Cindy JONELLE Joe, MD 06/22/2023 3:16 PM

## 2023-07-30 ENCOUNTER — Other Ambulatory Visit: Payer: Self-pay | Admitting: Internal Medicine

## 2023-08-04 ENCOUNTER — Encounter: Payer: Self-pay | Admitting: Internal Medicine

## 2023-08-04 ENCOUNTER — Other Ambulatory Visit: Payer: Self-pay | Admitting: Internal Medicine

## 2023-08-04 DIAGNOSIS — C7951 Secondary malignant neoplasm of bone: Secondary | ICD-10-CM

## 2023-08-05 ENCOUNTER — Encounter
Admission: RE | Admit: 2023-08-05 | Discharge: 2023-08-05 | Disposition: A | Discharge status: Home / Self Care | Payer: Medicare Other | Source: Ambulatory Visit | Attending: Internal Medicine

## 2023-08-05 ENCOUNTER — Encounter
Admission: RE | Admit: 2023-08-05 | Discharge: 2023-08-05 | Disposition: A | Discharge status: Home / Self Care | Payer: Medicare Other | Source: Ambulatory Visit | Attending: Internal Medicine | Admitting: Internal Medicine

## 2023-08-05 DIAGNOSIS — C61 Malignant neoplasm of prostate: Secondary | ICD-10-CM | POA: Diagnosis present

## 2023-08-05 DIAGNOSIS — C7951 Secondary malignant neoplasm of bone: Secondary | ICD-10-CM | POA: Insufficient documentation

## 2023-08-05 MED ORDER — TECHNETIUM TC 99M MEDRONATE IV KIT
20.0000 | PACK | Freq: Once | INTRAVENOUS | Status: AC | PRN
  Administered 2023-08-05: 22.35 via INTRAVENOUS

## 2023-08-06 ENCOUNTER — Encounter: Payer: Self-pay | Admitting: Internal Medicine

## 2023-08-20 ENCOUNTER — Encounter: Payer: Self-pay | Admitting: Internal Medicine

## 2023-08-20 ENCOUNTER — Inpatient Hospital Stay: Payer: Medicare Other | Attending: Internal Medicine

## 2023-08-20 ENCOUNTER — Inpatient Hospital Stay (HOSPITAL_BASED_OUTPATIENT_CLINIC_OR_DEPARTMENT_OTHER): Payer: Medicare Other | Admitting: Internal Medicine

## 2023-08-20 VITALS — BP 119/68 | HR 87 | Temp 98.3°F | Resp 16 | Ht 68.0 in | Wt 134.6 lb

## 2023-08-20 DIAGNOSIS — N179 Acute kidney failure, unspecified: Secondary | ICD-10-CM | POA: Diagnosis not present

## 2023-08-20 DIAGNOSIS — Z79899 Other long term (current) drug therapy: Secondary | ICD-10-CM | POA: Insufficient documentation

## 2023-08-20 DIAGNOSIS — J45909 Unspecified asthma, uncomplicated: Secondary | ICD-10-CM | POA: Diagnosis not present

## 2023-08-20 DIAGNOSIS — Z5111 Encounter for antineoplastic chemotherapy: Secondary | ICD-10-CM | POA: Insufficient documentation

## 2023-08-20 DIAGNOSIS — C7951 Secondary malignant neoplasm of bone: Secondary | ICD-10-CM | POA: Diagnosis not present

## 2023-08-20 DIAGNOSIS — Z6836 Body mass index (BMI) 36.0-36.9, adult: Secondary | ICD-10-CM | POA: Insufficient documentation

## 2023-08-20 DIAGNOSIS — R635 Abnormal weight gain: Secondary | ICD-10-CM | POA: Diagnosis not present

## 2023-08-20 DIAGNOSIS — R200 Anesthesia of skin: Secondary | ICD-10-CM | POA: Diagnosis not present

## 2023-08-20 DIAGNOSIS — N139 Obstructive and reflux uropathy, unspecified: Secondary | ICD-10-CM | POA: Insufficient documentation

## 2023-08-20 DIAGNOSIS — C61 Malignant neoplasm of prostate: Secondary | ICD-10-CM

## 2023-08-20 DIAGNOSIS — Z9079 Acquired absence of other genital organ(s): Secondary | ICD-10-CM | POA: Insufficient documentation

## 2023-08-20 DIAGNOSIS — R1310 Dysphagia, unspecified: Secondary | ICD-10-CM | POA: Insufficient documentation

## 2023-08-20 DIAGNOSIS — G20A1 Parkinson's disease without dyskinesia, without mention of fluctuations: Secondary | ICD-10-CM | POA: Insufficient documentation

## 2023-08-20 LAB — CMP (CANCER CENTER ONLY)
ALT: 8 U/L (ref 0–44)
AST: 17 U/L (ref 15–41)
Albumin: 4.3 g/dL (ref 3.5–5.0)
Alkaline Phosphatase: 59 U/L (ref 38–126)
Anion gap: 11 (ref 5–15)
BUN: 17 mg/dL (ref 8–23)
CO2: 26 mmol/L (ref 22–32)
Calcium: 9.6 mg/dL (ref 8.9–10.3)
Chloride: 100 mmol/L (ref 98–111)
Creatinine: 1.24 mg/dL (ref 0.61–1.24)
GFR, Estimated: >60 mL/min (ref >60)
Glucose, Bld: 94 mg/dL (ref 70–99)
Potassium: 3.8 mmol/L (ref 3.5–5.1)
Sodium: 137 mmol/L (ref 135–145)
Total Bilirubin: 0.5 mg/dL (ref 0.0–1.2)
Total Protein: 7 g/dL (ref 6.5–8.1)

## 2023-08-20 LAB — CBC WITH DIFFERENTIAL (CANCER CENTER ONLY)
Abs Immature Granulocytes: 0.02 10*3/uL (ref 0.00–0.07)
Basophils Absolute: 0 10*3/uL (ref 0.0–0.1)
Basophils Relative: 1 %
Eosinophils Absolute: 0.1 10*3/uL (ref 0.0–0.5)
Eosinophils Relative: 2 %
HCT: 42.5 % (ref 39.0–52.0)
Hemoglobin: 14.4 g/dL (ref 13.0–17.0)
Immature Granulocytes: 0 %
Lymphocytes Relative: 17 %
Lymphs Abs: 1 10*3/uL (ref 0.7–4.0)
MCH: 31.1 pg (ref 26.0–34.0)
MCHC: 33.9 g/dL (ref 30.0–36.0)
MCV: 91.8 fL (ref 80.0–100.0)
Monocytes Absolute: 0.6 10*3/uL (ref 0.1–1.0)
Monocytes Relative: 11 %
Neutro Abs: 4.2 10*3/uL (ref 1.7–7.7)
Neutrophils Relative %: 69 %
Platelet Count: 291 10*3/uL (ref 150–400)
RBC: 4.63 MIL/uL (ref 4.22–5.81)
RDW: 13 % (ref 11.5–15.5)
WBC Count: 6.1 10*3/uL (ref 4.0–10.5)
nRBC: 0 % (ref 0.0–0.2)

## 2023-08-20 LAB — PSA: Prostatic Specific Antigen: 18.59 ng/mL — ABNORMAL HIGH (ref 0.00–4.00)

## 2023-08-20 NOTE — Progress Notes (Signed)
  [facility] OFFICE PROGRESS NOTE  Patient Care Team: [name], MD as PCP - General (Family Medicine) [name], MD as Consulting Physician (Oncology) [name], MD as Consulting Physician (Radiation Oncology)   Cancer Staging  No matching staging information was found for the patient.    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 s/p Radiation [finished 25th, SEP 2024]-Continue Erleda  # n Christmas Day 2022 he fell and was no found for a couple of days and was found to be dehydrated and hypothermic.  Patient was admitted to the ICU in [place].  During hospitalization patient was found to have elevated PSA -  962 and mri showed extensive sclerosis in pelvic, sacral, spine, and scapula, prostate cancer metastatic to bone. Creatinine was 2.94 without normalization. He had severe sepsis and bacteruria.   At discharge patient was sent over to rehab.  Given the family in [place]/brother-patient moved to [place].  Prior to this episode patient was losing weight.  However patient had been very healthy prior to this episode.  He was living alone.  Patient has chronic difficulty swallowing since age of 63.?  Esophagus spasms.     Prostate cancer metastatic to bone (HCC)  08/14/2021 Initial Diagnosis   Prostate cancer metastatic to bone (HCC)      HISTORY OF PRESENT ILLNESS: Patient in a rolling walker.   Patient lives in assisted living/[facility]; accompanied by his brother.  Dary Dilauro 78 y.o.  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   Patient s/p Radiation to sacral area. No pain.   Appetite is good. Walks over a mile aday with his walker in the assissted living.  However gaining weight.  Appetite is improving.   Complains of mild numbness in the perineal region. No bowel or bladder incontinence.   Review of Systems  Constitutional:  Positive for malaise/fatigue and weight loss. Negative for chills, diaphoresis and fever.  HENT:  Negative for nosebleeds and sore throat.   Eyes:  Negative for double vision.  Respiratory:  Negative for cough, hemoptysis, sputum production, shortness of breath and wheezing.   Cardiovascular:  Negative for chest pain, palpitations, orthopnea and leg swelling.  Gastrointestinal:  Negative for abdominal pain, blood in stool, constipation, diarrhea, heartburn, melena, nausea and vomiting.  Genitourinary:  Negative for dysuria, frequency and urgency.  Musculoskeletal:  Negative for back pain and joint pain.  Skin: Negative.  Negative for itching and rash.  Neurological:  Positive for weakness. Negative for dizziness, tingling, focal weakness and headaches.  Endo/Heme/Allergies:  Does not bruise/bleed easily.  Psychiatric/Behavioral:  Negative for depression. The patient is not nervous/anxious and does not have insomnia.       PAST MEDICAL HISTORY :  Past Medical History:  Diagnosis Date   Acute kidney failure (HCC)    Arthritis    Asthma    Cancer (HCC)    Dysphagia  Parkinson disease (HCC)    Prostate cancer (HCC)    Rhabdomyolysis     PAST SURGICAL HISTORY :   Past Surgical History:  Procedure Laterality Date   CARDIAC CATHETERIZATION     KNEE ARTHROSCOPY Right 2012   also lt knee unsure of date   TRANSURETHRAL RESECTION OF PROSTATE N/A 03/17/2022   Procedure: TRANSURETHRAL  RESECTION OF THE PROSTATE (TURP);  Surgeon: Vanna Scotland, MD;  Location: [facility] ORS;  Service: Urology;  Laterality: N/A;    FAMILY HISTORY :   Family History  Problem Relation Age of Onset   Arthritis/Rheumatoid Mother    Stroke Father    Hemangiomas Brother     SOCIAL HISTORY:   Social History   Tobacco Use   Smoking status: Never   Smokeless tobacco: Never  Vaping Use   Vaping status: Never Used  Substance Use Topics   Alcohol use: Yes   Drug use: Never    ALLERGIES:  has no known allergies.  MEDICATIONS:  Current Outpatient Medications  Medication Sig Dispense Refill   acetaminophen (TYLENOL) 325 MG tablet Take 650 mg by mouth every 6 (six) hours as needed.     calcium-vitamin D (OSCAL WITH D) 500-5 MG-MCG tablet TAKE 1 TABLET BY MOUTH TWICE A DAY. 60 tablet 10   carbidopa-levodopa (SINEMET IR) 25-100 MG tablet Take 1 tablet by mouth 3 (three) times daily. 90 tablet 3   [name] 60 MG tablet TAKE 4 TABLETS BY MOUTH EVERY DAY 120 tablet 2   Leuprolide Acetate (ELIGARD ) Inject into the skin.     omeprazole (PRILOSEC) 20 MG capsule Take 1 capsule (20 mg total) by mouth every morning. 90 capsule 3   No current facility-administered medications for this visit.    PHYSICAL EXAMINATION: ECOG PERFORMANCE STATUS: 2 - Symptomatic, <50% confined to bed  BP 119/68 (BP Location: Left Arm, Patient Position: Sitting, Cuff Size: Normal)   Pulse 87   Temp 98.3 F (36.8 C) (Tympanic)   Resp 16   Ht 5\' 8"  (1.727 m)   Wt 134 lb 9.6 oz (61.1 kg)   SpO2 98%   BMI 20.47 kg/m   Filed Weights   08/20/23 0957  Weight: 134 lb 9.6 oz (61.1 kg)     Physical Exam Vitals and nursing note reviewed.  HENT:     Head: Normocephalic and atraumatic.     Mouth/Throat:     Pharynx: Oropharynx is clear.  Eyes:     Extraocular Movements: Extraocular movements intact.     Pupils: Pupils are equal, round, and reactive to light.  Cardiovascular:     Rate and Rhythm: Normal rate and  regular rhythm.  Pulmonary:     Comments: Decreased breath sounds bilaterally.  Abdominal:     Palpations: Abdomen is soft.  Musculoskeletal:        General: Normal range of motion.     Cervical back: Normal range of motion.  Skin:    General: Skin is warm.  Neurological:     General: No focal deficit present.     Mental Status: He is alert and oriented to person, place, and time.  Psychiatric:        Behavior: Behavior normal.        Judgment: Judgment normal.     LABORATORY DATA:  I have reviewed the data as listed    Component Value Date/Time   NA 137 08/20/2023 0949   NA 140 07/31/2021 1612   K 3.8 08/20/2023 0949   CL  100 08/20/2023 0949   CO2 26 08/20/2023 0949   GLUCOSE 94 08/20/2023 0949   BUN 17 08/20/2023 0949   BUN 25 07/31/2021 1612   CREATININE 1.24 08/20/2023 0949   CALCIUM 9.6 08/20/2023 0949   PROT 7.0 08/20/2023 0949   PROT 6.5 07/31/2021 1612   ALBUMIN 4.3 08/20/2023 0949   ALBUMIN 4.3 07/31/2021 1612   AST 17 08/20/2023 0949   ALT 8 08/20/2023 0949   ALKPHOS 59 08/20/2023 0949   BILITOT 0.5 08/20/2023 0949   GFRNONAA >60 08/20/2023 0949    No results found for: "SPEP", "UPEP"  Lab Results  Component Value Date   WBC 6.1 08/20/2023   NEUTROABS 4.2 08/20/2023   HGB 14.4 08/20/2023   HCT 42.5 08/20/2023   MCV 91.8 08/20/2023   PLT 291 08/20/2023      Chemistry      Component Value Date/Time   NA 137 08/20/2023 0949   NA 140 07/31/2021 1612   K 3.8 08/20/2023 0949   CL 100 08/20/2023 0949   CO2 26 08/20/2023 0949   BUN 17 08/20/2023 0949   BUN 25 07/31/2021 1612   CREATININE 1.24 08/20/2023 0949      Component Value Date/Time   CALCIUM 9.6 08/20/2023 0949   ALKPHOS 59 08/20/2023 0949   AST 17 08/20/2023 0949   ALT 8 08/20/2023 0949   BILITOT 0.5 08/20/2023 0949       RADIOGRAPHIC STUDIES: I have personally reviewed the radiological images as listed and agreed with the findings in the report. No results found.    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 DEC 2023-multiple bone lesions noted.  DEC 2023-CT scan CAP- no visceral metastases. PSA rising at 65 [NOV 2023].  On apalutamide 4 pills a day [dissolved in water; mid NOV 2023]. DECLINED Zometa. Currently on apalutamide- AUG 19th, 2024- PSMA PET scan-  Intense radiotracer activity involving the sacrum and RIGHT iliac bone consistent with active skeletal metastasis.  No evidence of local prostate carcinoma recurrence in the prostate bed. No evidence of metastatic adenopathy in the pelvis or periaortic retroperitoneum; No evidence of visceral metastasis or pulmonary metastasis. NGS blood work/tempus- AUG 2024-  O targets  # Patient tolerating apalutamide 4 pills a day [dissolved in water]-fairly well. NOV  2024- PSA-200; JAN 2025- PSA 9.45  Continue Eligard q6 M + apalutamide. Continue apalutamide at this time. consider for pluvicto in future. Await bone scan- FEB 2025- results.   # AUG PET scan 2024-: Intense radiotracer activity involving the sacrum and RIGHT iliac bone consistent with active skeletal metastasis. s/p Radiation [finished 25th, SEP 2024]- monitor for now- stable.   # Urinary obstruction -s/p TURP [OCT 2023; Dr.Brandon]- stable  # Dysphagia- [> 30 years-] ?  Esophageal spasms status post barium swallow. stable Continue follow-up with GI.   stable  #Parkinson's-[Dr.Potter]-  on sinemet. table  # Social issues: patient wants to move back to [facility], [place]. discussed with patient that it could be difficult for him to live alone given his limited performance status and also the incurable disease. Recommend holding off any move at this time. Again reviewed with patient/brother.    *Eligard 45mg - JAN 13th 2025;  [facility]  *transport- staff to call brother re: bone scan.  # DISPOSITION: # follow up in 2  months MD: labs- cbc/cmp; PSA- Dr.B    Orders Placed This  Encounter  Procedures   CBC with Differential (Cancer Center Only)    Standing Status:   Future    Expected Date:   10/20/2023    Expiration Date:   08/19/2024   CMP (Cancer Center only)    Standing Status:   Future    Expected Date:   10/20/2023    Expiration Date:   08/19/2024   PSA    Standing Status:   Future    Expected Date:   10/20/2023    Expiration Date:   08/19/2024   All questions were answered. The patient knows to call the clinic with any problems, questions or concerns.      [name], MD 08/20/2023 11:34 AM

## 2023-08-20 NOTE — Assessment & Plan Note (Addendum)
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 DEC 2023-multiple bone lesions noted.  DEC 2023-CT scan CAP- no visceral metastases. PSA rising at 65 [NOV 2023].  On apalutamide 4 pills a day [dissolved in water; mid NOV 2023]. DECLINED Zometa. Currently on apalutamide- AUG 19th, 2024- PSMA PET scan-  Intense radiotracer activity involving the sacrum and RIGHT iliac bone consistent with active skeletal metastasis.  No evidence of local prostate carcinoma recurrence in the prostate bed. No evidence of metastatic adenopathy in the pelvis or periaortic retroperitoneum; No evidence of visceral metastasis or pulmonary metastasis. NGS blood work/tempus- AUG 2024-  O targets  # Patient tolerating apalutamide 4 pills a day [dissolved in water]-fairly well. NOV  2024- PSA-200; JAN 2025- PSA 9.45  Continue Eligard q6 M + apalutamide. Continue apalutamide at this time. consider for pluvicto in future. Await bone scan- FEB 2025- results.   # AUG PET scan 2024-: Intense radiotracer activity involving the sacrum and RIGHT iliac bone consistent with active skeletal metastasis. s/p Radiation [finished 25th, SEP 2024]- monitor for now- stable.   # Urinary obstruction -s/p TURP [OCT 2023; Dr.Brandon]- stable  # Dysphagia- [> 30 years-] ?  Esophageal spasms status post barium swallow. stable Continue follow-up with GI.   stable  #Parkinson's-[Dr.Potter]-  on sinemet. table  # Social issues: patient wants to move back to [facility], [place]. discussed with patient that it could be difficult for him to live alone given his limited performance status and also the incurable disease. Recommend holding off any move at this time. Again reviewed with patient/brother.    *Eligard 45mg - JAN 13th 2025;  [facility]  *transport- staff to call brother re: bone scan.  # DISPOSITION: # follow up in 2 months MD: labs- cbc/cmp; PSA- Dr.B

## 2023-08-20 NOTE — Progress Notes (Signed)
 Bone Scan 08/05/23, was called for read.

## 2023-08-28 ENCOUNTER — Telehealth: Payer: Self-pay | Admitting: *Deleted

## 2023-08-28 NOTE — Telephone Encounter (Signed)
 They just want to know the results bone scan

## 2023-08-31 ENCOUNTER — Telehealth: Payer: Self-pay | Admitting: *Deleted

## 2023-08-31 DIAGNOSIS — C61 Malignant neoplasm of prostate: Secondary | ICD-10-CM

## 2023-08-31 NOTE — Telephone Encounter (Signed)
 I spoke to patient's brother-James Mofield regarding the results of the bone scan-stable but PSA rising.  Recommend PET scan before the next visit.  Otherwise no treatment changes at this time.  Recommend follow-up-as planned; PET scan prior to schedule-  GB

## 2023-08-31 NOTE — Telephone Encounter (Signed)
 Pt called Friday of last week and I sent the message that they want to know about the results of bone scan. The called again this am about the results. They would like a  call

## 2023-10-14 ENCOUNTER — Ambulatory Visit
Admission: RE | Admit: 2023-10-14 | Discharge: 2023-10-14 | Disposition: A | Discharge status: Home / Self Care | Source: Ambulatory Visit | Attending: Internal Medicine | Admitting: Internal Medicine

## 2023-10-14 DIAGNOSIS — C61 Malignant neoplasm of prostate: Secondary | ICD-10-CM | POA: Diagnosis present

## 2023-10-14 DIAGNOSIS — C786 Secondary malignant neoplasm of retroperitoneum and peritoneum: Secondary | ICD-10-CM | POA: Insufficient documentation

## 2023-10-14 DIAGNOSIS — C787 Secondary malignant neoplasm of liver and intrahepatic bile duct: Secondary | ICD-10-CM | POA: Insufficient documentation

## 2023-10-14 DIAGNOSIS — C7951 Secondary malignant neoplasm of bone: Secondary | ICD-10-CM | POA: Diagnosis present

## 2023-10-14 DIAGNOSIS — I700 Atherosclerosis of aorta: Secondary | ICD-10-CM | POA: Diagnosis not present

## 2023-10-14 MED ORDER — FLOTUFOLASTAT F 18 GALLIUM 296-5846 MBQ/ML IV SOLN
8.0000 | Freq: Once | INTRAVENOUS | Status: AC
Start: 2023-10-14 — End: 2023-10-14
  Administered 2023-10-14: 8.69 via INTRAVENOUS
  Filled 2023-10-14: qty 8

## 2023-10-21 ENCOUNTER — Encounter: Payer: Self-pay | Admitting: Internal Medicine

## 2023-10-21 ENCOUNTER — Inpatient Hospital Stay: Attending: Internal Medicine

## 2023-10-21 ENCOUNTER — Inpatient Hospital Stay (HOSPITAL_BASED_OUTPATIENT_CLINIC_OR_DEPARTMENT_OTHER): Admitting: Internal Medicine

## 2023-10-21 VITALS — BP 141/69 | HR 73 | Temp 97.8°F | Resp 16 | Ht 68.0 in | Wt 136.2 lb

## 2023-10-21 DIAGNOSIS — Z8673 Personal history of transient ischemic attack (TIA), and cerebral infarction without residual deficits: Secondary | ICD-10-CM | POA: Diagnosis not present

## 2023-10-21 DIAGNOSIS — R1310 Dysphagia, unspecified: Secondary | ICD-10-CM | POA: Insufficient documentation

## 2023-10-21 DIAGNOSIS — R200 Anesthesia of skin: Secondary | ICD-10-CM | POA: Insufficient documentation

## 2023-10-21 DIAGNOSIS — C61 Malignant neoplasm of prostate: Secondary | ICD-10-CM | POA: Insufficient documentation

## 2023-10-21 DIAGNOSIS — C7951 Secondary malignant neoplasm of bone: Secondary | ICD-10-CM

## 2023-10-21 DIAGNOSIS — Z923 Personal history of irradiation: Secondary | ICD-10-CM | POA: Insufficient documentation

## 2023-10-21 DIAGNOSIS — R635 Abnormal weight gain: Secondary | ICD-10-CM | POA: Insufficient documentation

## 2023-10-21 DIAGNOSIS — Z79899 Other long term (current) drug therapy: Secondary | ICD-10-CM | POA: Insufficient documentation

## 2023-10-21 DIAGNOSIS — N139 Obstructive and reflux uropathy, unspecified: Secondary | ICD-10-CM | POA: Insufficient documentation

## 2023-10-21 DIAGNOSIS — Z191 Hormone sensitive malignancy status: Secondary | ICD-10-CM | POA: Insufficient documentation

## 2023-10-21 DIAGNOSIS — K224 Dyskinesia of esophagus: Secondary | ICD-10-CM | POA: Insufficient documentation

## 2023-10-21 DIAGNOSIS — G20A1 Parkinson's disease without dyskinesia, without mention of fluctuations: Secondary | ICD-10-CM | POA: Insufficient documentation

## 2023-10-21 DIAGNOSIS — Z9079 Acquired absence of other genital organ(s): Secondary | ICD-10-CM | POA: Diagnosis not present

## 2023-10-21 LAB — CMP (CANCER CENTER ONLY)
ALT: 6 U/L (ref 0–44)
AST: 16 U/L (ref 15–41)
Albumin: 4 g/dL (ref 3.5–5.0)
Alkaline Phosphatase: 58 U/L (ref 38–126)
Anion gap: 10 (ref 5–15)
BUN: 19 mg/dL (ref 8–23)
CO2: 27 mmol/L (ref 22–32)
Calcium: 9.5 mg/dL (ref 8.9–10.3)
Chloride: 99 mmol/L (ref 98–111)
Creatinine: 1.31 mg/dL — ABNORMAL HIGH (ref 0.61–1.24)
GFR, Estimated: 57 mL/min — ABNORMAL LOW (ref >60)
Glucose, Bld: 97 mg/dL (ref 70–99)
Potassium: 4.2 mmol/L (ref 3.5–5.1)
Sodium: 136 mmol/L (ref 135–145)
Total Bilirubin: 0.5 mg/dL (ref 0.0–1.2)
Total Protein: 6.8 g/dL (ref 6.5–8.1)

## 2023-10-21 LAB — CBC WITH DIFFERENTIAL (CANCER CENTER ONLY)
Abs Immature Granulocytes: 0.02 10*3/uL (ref 0.00–0.07)
Basophils Absolute: 0.1 10*3/uL (ref 0.0–0.1)
Basophils Relative: 1 %
Eosinophils Absolute: 0.1 10*3/uL (ref 0.0–0.5)
Eosinophils Relative: 2 %
HCT: 38 % — ABNORMAL LOW (ref 39.0–52.0)
Hemoglobin: 13.1 g/dL (ref 13.0–17.0)
Immature Granulocytes: 0 %
Lymphocytes Relative: 20 %
Lymphs Abs: 1.2 10*3/uL (ref 0.7–4.0)
MCH: 31.2 pg (ref 26.0–34.0)
MCHC: 34.5 g/dL (ref 30.0–36.0)
MCV: 90.5 fL (ref 80.0–100.0)
Monocytes Absolute: 0.7 10*3/uL (ref 0.1–1.0)
Monocytes Relative: 13 %
Neutro Abs: 3.8 10*3/uL (ref 1.7–7.7)
Neutrophils Relative %: 64 %
Platelet Count: 292 10*3/uL (ref 150–400)
RBC: 4.2 MIL/uL — ABNORMAL LOW (ref 4.22–5.81)
RDW: 12.9 % (ref 11.5–15.5)
WBC Count: 5.9 10*3/uL (ref 4.0–10.5)
nRBC: 0 % (ref 0.0–0.2)

## 2023-10-21 LAB — PSA: Prostatic Specific Antigen: 12.64 ng/mL — ABNORMAL HIGH (ref 0.00–4.00)

## 2023-10-21 NOTE — Assessment & Plan Note (Addendum)
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 DEC 2023-multiple bone lesions noted.  DEC 2023-CT scan CAP- no visceral metastases. PSA rising at 65 [NOV 2023].  On apalutamide  4 pills a day [dissolved in water; mid NOV 2023]. DECLINED Zometa. Currently on apalutamide - AUG 19th, 2024- PSMA PET scan-  Intense radiotracer activity involving the sacrum and RIGHT iliac bone consistent with active skeletal metastasis.  No evidence of local prostate carcinoma recurrence in the prostate bed. No evidence of metastatic adenopathy in the pelvis or periaortic retroperitoneum; No evidence of visceral metastasis or pulmonary metastasis. NGS blood work/tempus- AUG 2024-  O targets  # Patient tolerating apalutamide  4 pills a day [dissolved in water]-fairly well. NOV  2024- PSA-200; MARCH 2025- Bone scan- stable, BUT MARCH  2025- PSA 19.5/rising ; await PSA from today. Aaron Aas MAY 2025- PET scan- pending. Discussed re: options of Taxotere vs pluvicto.   #  I reviewed at length the individual components with chemotherapy; and the schedule in detail.  I also discussed the potential side effects including but not limited to-increasing fatigue, nausea vomiting, diarrhea, hair loss, sores in the mouth, increase risk of infection and also neuropathy.  Also reviewed the multiple strategies to avoid/mitigate similar side effects including preemptive medications-for nausea vomiting.  Also discussed at length regarding good oral hygiene/hydration and in general precautions regarding duration of infections. For NOW- Continue Eligard  q6 M + apalutamide ; awaiting PET results.   # AUG PET scan 2024-: Intense radiotracer activity involving the sacrum and RIGHT iliac bone consistent with active skeletal metastasis. s/p Radiation [finished 25th, SEP 2024]- monitor for now- stable.   # Urinary obstruction -s/p TURP [OCT 2023; Dr.Brandon]- stable  # Dysphagia- [> 30 years-] ?   Esophageal spasms status post barium swallow. stable Continue follow-up with GI.   stable  #Parkinson's-[Dr.Potter]-  on sinemet . table  # Social issues: patient wants to move back to [facility] discussed with patient that it could be difficult for him to live alone given his limited performance status and also the incurable disease. Recommend holding off any move at this time. Again reviewed with patient/brother.    # # ACP:   *Eligard  45mg - JAN 13th 2025;  [facility]  *transport- staff to call brother re: PET scan  # DISPOSITION: # follow up TBD-- Dr.B

## 2023-10-21 NOTE — Progress Notes (Signed)
 PET 10/14/23, called for read.

## 2023-10-21 NOTE — Progress Notes (Signed)
 [facility] OFFICE PROGRESS NOTE  Patient Care Team: Nikki Barters, MD as PCP - General (Family Medicine) Gwyn Leos, MD as Consulting Physician (Oncology) Glenis Langdon, MD as Consulting Physician (Radiation Oncology)   Cancer Staging  No matching staging information was found for the patient.    Oncology History Overview Note  # DEC 2022 [X-mas- found s/p fall; [place] ; PSA 962; s/p Bx of Philip [name] bone[Dr.[name]]; s/p ADT in [place]; FEB 2023- 62 [Dr.[name]]  # # OCT 2023-castrate resistant prostate cancer; DEC 2023- Bone scan/CT CAP- bone lesions;  # NOV end 2023- Started APALUTAMIDE  240 [4 pills- dissolved in water];   # AUG PET scan 2024-: Intense radiotracer activity involving the sacrum and RIGHT iliac bone consistent with active skeletal metastasis. s/p Radiation [finished 25th, SEP 2024]-Continue Erleda  # n Christmas Day 2022 he fell and was no found for a couple of days and was found to be dehydrated and hypothermic.  Patient was admitted to the ICU in [place] .  During hospitalization patient was found to have elevated PSA -  962 and mri showed extensive sclerosis in pelvic, sacral, spine, and scapula, prostate cancer metastatic to bone. Creatinine was 2.94 without normalization. He had severe sepsis and bacteruria.   At discharge patient was sent over to rehab.  Given the family in [place]/brother-patient moved to [place] .  Prior to this episode patient was losing weight.  However patient had been very healthy prior to this episode.  He was living alone.  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  Esophagus spasms.     Prostate cancer metastatic to bone (HCC)  08/14/2021 Initial Diagnosis   Prostate cancer metastatic to bone (HCC)      HISTORY OF PRESENT ILLNESS: Patient in a rolling walker.   Patient lives in assisted living/[facility]; accompanied by his brother.  Elburn Clemmons 78 y.o.  male pleasant patient  above history of metastatic castrate resistant prostate cancer to the bone  currently on apalutamide  started in end of NOV, 2023, is here for a follow up; and review the results of the PET scan.   Patient noted to have worsening/elevated PSA-with a stable bone scan.  A PET scan was ordered.  Patient s/p Radiation to sacral area. No pain. Appetite is good. Walks over a mile aday with his walker in the assissted living.  However gaining weight.  Appetite is improving.   Complains of mild numbness in the perineal region. No bowel or bladder incontinence.   Review of Systems  Constitutional:  Positive for malaise/fatigue and weight loss. Negative for chills, diaphoresis and fever.  HENT:  Negative for nosebleeds and sore throat.   Eyes:  Negative for double vision.  Respiratory:  Negative for cough, hemoptysis, sputum production, shortness of breath and wheezing.   Cardiovascular:  Negative for chest pain, palpitations, orthopnea and leg swelling.  Gastrointestinal:  Negative for abdominal pain, blood in stool, constipation, diarrhea, heartburn, melena, nausea and vomiting.  Genitourinary:  Negative for dysuria, frequency and urgency.  Musculoskeletal:  Negative for back pain and joint pain.  Skin: Negative.  Negative for itching and rash.  Neurological:  Positive for weakness. Negative for dizziness, tingling, focal weakness and headaches.  Endo/Heme/Allergies:  Does not bruise/bleed easily.  Psychiatric/Behavioral:  Negative for depression. The patient is not nervous/anxious and does not have insomnia.       PAST MEDICAL HISTORY :  Past Medical History:  Diagnosis Date   Acute kidney failure (HCC)  Arthritis    Asthma    Cancer (HCC)    Dysphagia    Parkinson disease (HCC)    Prostate cancer (HCC)    Rhabdomyolysis     PAST SURGICAL HISTORY :   Past Surgical History:  Procedure Laterality Date   CARDIAC CATHETERIZATION     KNEE ARTHROSCOPY Right 2012   also lt knee unsure of  date   TRANSURETHRAL RESECTION OF PROSTATE N/A 03/17/2022   Procedure: TRANSURETHRAL RESECTION OF THE PROSTATE (TURP);  Surgeon: Dustin Gimenez, MD;  Location: [facility] ORS;  Service: Urology;  Laterality: N/A;    FAMILY HISTORY :   Family History  Problem Relation Age of Onset   Arthritis/Rheumatoid Mother    Stroke Father    Hemangiomas Brother     SOCIAL HISTORY:   Social History   Tobacco Use   Smoking status: Never   Smokeless tobacco: Never  Vaping Use   Vaping status: Never Used  Substance Use Topics   Alcohol use: Yes   Drug use: Never    ALLERGIES:  has no known allergies.  MEDICATIONS:  Current Outpatient Medications  Medication Sig Dispense Refill   acetaminophen  (TYLENOL ) 325 MG tablet Take 650 mg by mouth every 6 (six) hours as needed.     calcium -vitamin D  (OSCAL WITH D) 500-5 MG-MCG tablet TAKE 1 TABLET BY MOUTH TWICE A DAY. 60 tablet 10   carbidopa -levodopa  (SINEMET  IR) 25-100 MG tablet Take 1 tablet by mouth 3 (three) times daily. 90 tablet 3   [name]  60 MG tablet TAKE 4 TABLETS BY MOUTH EVERY DAY 120 tablet 2   Leuprolide  Acetate (ELIGARD  Cecil-Bishop) Inject into the skin.     omeprazole  (PRILOSEC) 20 MG capsule Take 1 capsule (20 mg total) by mouth every morning. (Patient not taking: Reported on 10/21/2023) 90 capsule 3   No current facility-administered medications for this visit.    PHYSICAL EXAMINATION: ECOG PERFORMANCE STATUS: 2 - Symptomatic, <50% confined to bed  BP (!) 141/69 (BP Location: Left Arm, Patient Position: Sitting, Cuff Size: Normal)   Pulse 73   Temp 97.8 F (36.6 C) (Tympanic)   Resp 16   Ht 5\' 8"  (1.727 m)   Wt 136 lb 3.2 oz (61.8 kg)   SpO2 99%   BMI 20.71 kg/m   Filed Weights   10/21/23 1030  Weight: 136 lb 3.2 oz (61.8 kg)     Physical Exam Vitals and nursing note reviewed.  HENT:     Head: Normocephalic and atraumatic.     Mouth/Throat:     Pharynx: Oropharynx is clear.  Eyes:     Extraocular Movements: Extraocular  movements intact.     Pupils: Pupils are equal, round, and reactive to light.  Cardiovascular:     Rate and Rhythm: Normal rate and regular rhythm.  Pulmonary:     Comments: Decreased breath sounds bilaterally.  Abdominal:     Palpations: Abdomen is soft.  Musculoskeletal:        General: Normal range of motion.     Cervical back: Normal range of motion.  Skin:    General: Skin is warm.  Neurological:     General: No focal deficit present.     Mental Status: He is alert and oriented to person, place, and time.  Psychiatric:        Behavior: Behavior normal.        Judgment: Judgment normal.     LABORATORY DATA:  I have reviewed the data as listed  Component Value Date/Time   NA 136 10/21/2023 1034   NA 140 07/31/2021 1612   K 4.2 10/21/2023 1034   CL 99 10/21/2023 1034   CO2 27 10/21/2023 1034   GLUCOSE 97 10/21/2023 1034   BUN 19 10/21/2023 1034   BUN 25 07/31/2021 1612   CREATININE 1.31 (H) 10/21/2023 1034   CALCIUM  9.5 10/21/2023 1034   PROT 6.8 10/21/2023 1034   PROT 6.5 07/31/2021 1612   ALBUMIN 4.0 10/21/2023 1034   ALBUMIN 4.3 07/31/2021 1612   AST 16 10/21/2023 1034   ALT 6 10/21/2023 1034   ALKPHOS 58 10/21/2023 1034   BILITOT 0.5 10/21/2023 1034   GFRNONAA 57 (L) 10/21/2023 1034    No results found for: "SPEP", "UPEP"  Lab Results  Component Value Date   WBC 5.9 10/21/2023   NEUTROABS 3.8 10/21/2023   HGB 13.1 10/21/2023   HCT 38.0 (L) 10/21/2023   MCV 90.5 10/21/2023   PLT 292 10/21/2023      Chemistry      Component Value Date/Time   NA 136 10/21/2023 1034   NA 140 07/31/2021 1612   K 4.2 10/21/2023 1034   CL 99 10/21/2023 1034   CO2 27 10/21/2023 1034   BUN 19 10/21/2023 1034   BUN 25 07/31/2021 1612   CREATININE 1.31 (H) 10/21/2023 1034      Component Value Date/Time   CALCIUM  9.5 10/21/2023 1034   ALKPHOS 58 10/21/2023 1034   AST 16 10/21/2023 1034   ALT 6 10/21/2023 1034   BILITOT 0.5 10/21/2023 1034      Latest  Reference Range & Units 10/23/21 10:12 12/04/21 10:29 02/05/22 14:38 03/31/22 11:28 04/24/22 13:19 05/19/22 08:57 06/20/22 09:57 08/21/22 08:29 10/21/22 13:06 12/25/22 10:36 02/02/23 09:54 03/05/23 09:30 04/09/23 09:37 05/11/23 09:52 06/22/23 14:11 08/20/23 09:48  Prostatic Specific Antigen 0.00 - 4.00 ng/mL 16.55 (H) 8.25 (H) 15.66 (H) 35.24 (H) 65.39 (H) 12.85 (H) 3.90 1.69 3.07 15.67 (H) 31.46 (H) 62.19 (H) 20.94 (H) 11.94 (H) 9.45 (H) 18.59 (H)  (H): Data is abnormally high  RADIOGRAPHIC STUDIES: I have personally reviewed the radiological images as listed and agreed with the findings in the report. No results found.   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 DEC 2023-multiple bone lesions noted.  DEC 2023-CT scan CAP- no visceral metastases. PSA rising at 65 [NOV 2023].  On apalutamide  4 pills a day [dissolved in water; mid NOV 2023]. DECLINED Zometa. Currently on apalutamide - AUG 19th, 2024- PSMA PET scan-  Intense radiotracer activity involving the sacrum and RIGHT iliac bone consistent with active skeletal metastasis.  No evidence of local prostate carcinoma recurrence in the prostate bed. No evidence of metastatic adenopathy in the pelvis or periaortic retroperitoneum; No evidence of visceral metastasis or pulmonary metastasis. NGS blood work/tempus- AUG 2024-  O targets  # Patient tolerating apalutamide  4 pills a day [dissolved in water]-fairly well. NOV  2024- PSA-200; MARCH 2025- Bone scan- stable, BUT MARCH  2025- PSA 19.5/rising ; await PSA from today. Aaron Aas MAY 2025- PET scan- pending. Discussed re: options of Taxotere vs pluvicto.   #  I reviewed at length the individual components with chemotherapy; and the schedule in detail.  I also discussed the potential side effects including but not limited to-increasing fatigue, nausea vomiting, diarrhea, hair  loss, sores in the mouth, increase risk of infection and also neuropathy.  Also reviewed the multiple strategies to avoid/mitigate similar side effects including preemptive medications-for nausea vomiting.  Also discussed at length regarding good oral hygiene/hydration and in general precautions regarding duration of infections. For NOW- Continue Eligard  q6 M + apalutamide ; awaiting PET results.   # AUG PET scan 2024-: Intense radiotracer activity involving the sacrum and RIGHT iliac bone consistent with active skeletal metastasis. s/p Radiation [finished 25th, SEP 2024]- monitor for now- stable.   # Urinary obstruction -s/p TURP [OCT 2023; Dr.Brandon]- stable  # Dysphagia- [> 30 years-] ?  Esophageal spasms status post barium swallow. stable Continue follow-up with GI.   stable  #Parkinson's-[Dr.Potter]-  on sinemet . table  # Social issues: patient wants to move back to [facility], [place]. discussed with patient that it could be difficult for him to live alone given his limited performance status and also the incurable disease. Recommend holding off any move at this time. Again reviewed with patient/brother.    # # ACP:   *Eligard  45mg - JAN 13th 2025;  [facility]  *transport- staff to call brother re: PET scan  # DISPOSITION: # follow up TBD-- Dr.B     No orders of the defined types were placed in this encounter.  All questions were answered. The patient knows to call the clinic with any problems, questions or concerns.      Gwyn Leos, MD 10/21/2023 2:01 PM

## 2023-10-22 ENCOUNTER — Encounter: Payer: Self-pay | Admitting: Internal Medicine

## 2023-10-22 NOTE — Progress Notes (Signed)
 Spoke to patient brother POA regarding progression of cancer on the PET scan.  Recommend follow-up in the cancer center in about 2 weeks or so MD no labs.  GB

## 2023-10-28 ENCOUNTER — Other Ambulatory Visit: Payer: Self-pay | Admitting: *Deleted

## 2023-10-28 DIAGNOSIS — C61 Malignant neoplasm of prostate: Secondary | ICD-10-CM

## 2023-10-28 MED ORDER — APALUTAMIDE 60 MG PO TABS
240.0000 mg | ORAL_TABLET | Freq: Every day | ORAL | 2 refills | Status: DC
Start: 2023-10-28 — End: 2024-01-08

## 2023-11-18 ENCOUNTER — Inpatient Hospital Stay: Attending: Internal Medicine | Admitting: Internal Medicine

## 2023-11-18 ENCOUNTER — Encounter: Payer: Self-pay | Admitting: Internal Medicine

## 2023-11-18 VITALS — BP 141/73 | HR 66 | Temp 98.5°F | Resp 16 | Ht 68.0 in | Wt 135.9 lb

## 2023-11-18 DIAGNOSIS — C61 Malignant neoplasm of prostate: Secondary | ICD-10-CM | POA: Insufficient documentation

## 2023-11-18 DIAGNOSIS — C7951 Secondary malignant neoplasm of bone: Secondary | ICD-10-CM | POA: Diagnosis not present

## 2023-11-18 DIAGNOSIS — Z923 Personal history of irradiation: Secondary | ICD-10-CM | POA: Insufficient documentation

## 2023-11-18 DIAGNOSIS — K224 Dyskinesia of esophagus: Secondary | ICD-10-CM | POA: Insufficient documentation

## 2023-11-18 DIAGNOSIS — Z191 Hormone sensitive malignancy status: Secondary | ICD-10-CM | POA: Diagnosis not present

## 2023-11-18 DIAGNOSIS — Z9079 Acquired absence of other genital organ(s): Secondary | ICD-10-CM | POA: Diagnosis not present

## 2023-11-18 DIAGNOSIS — N139 Obstructive and reflux uropathy, unspecified: Secondary | ICD-10-CM | POA: Insufficient documentation

## 2023-11-18 DIAGNOSIS — R1310 Dysphagia, unspecified: Secondary | ICD-10-CM | POA: Insufficient documentation

## 2023-11-18 DIAGNOSIS — G20A1 Parkinson's disease without dyskinesia, without mention of fluctuations: Secondary | ICD-10-CM | POA: Insufficient documentation

## 2023-11-18 NOTE — Progress Notes (Signed)
 I connected with Philip [name] on 11/18/23 at  3:30 PM EDT by video enabled telemedicine visit and verified that I am speaking with the correct person using two identifiers.  I discussed the limitations, risks, security and privacy concerns of performing an evaluation and management service by telemedicine and the availability of in-person appointments. I also discussed with the patient that there may be a patient responsible charge related to this service. The patient expressed understanding and agreed to proceed.    Other persons participating in the visit and their role in the encounter: RN/medical reconciliation Patient's location: office Provider's location: home  Oncology History Overview Note  # DEC 2022 [X-mas- found s/p fall; [place] ; PSA 962; s/p Bx of [name][Dr.[name]]; s/p ADT in [place]; FEB 2023- 62 [Dr.[name]]  # # OCT 2023-castrate resistant prostate cancer; DEC 2023- Bone scan/CT CAP- bone lesions;  # NOV end 2023- Started APALUTAMIDE  240 [4 pills- dissolved in water];   # AUG PET scan 2024-: Intense radiotracer activity involving the sacrum and RIGHT iliac bone consistent with active skeletal metastasis. s/p Radiation [finished 25th, SEP 2024]-Continue Erleda  #  MAY 2025- PET scan- Interval improvement in tracer avid tumor involving the sacrum and posterior right iliac bone; New/progressive foci of tracer avid bone metastasis are identified involving the left second rib, right iliac bone and right acetabulum;  New tracer avid retroperitoneal and right iliac chain nodal metastasis;  New focus of increased uptake within segment 4 of the liver without corresponding CT abnormality, equivocal for liver metastasis.   # Discussed re: options of Taxotere vs pluvicto.  I would prefer Taxotere [60 mg/m every 3 weeks]   # n Christmas Day 2022 he fell and was no found for a couple of days and was found to be dehydrated and hypothermic.  Patient was admitted to the ICU in [place] .   During hospitalization patient was found to have elevated PSA -  962 and mri showed extensive sclerosis in pelvic, sacral, spine, and scapula, prostate cancer metastatic to bone. Creatinine was 2.94 without normalization. He had severe sepsis and bacteruria.   At discharge patient was sent over to rehab.  Given the family in [place]/brother-patient moved to [place] .  Prior to this episode patient was losing weight.  However patient had been very healthy prior to this episode.  He was living alone.  Patient has chronic difficulty swallowing since age of 62.?  Esophagus spasms.     Prostate cancer metastatic to bone (HCC)  08/14/2021 Initial Diagnosis   Prostate cancer metastatic to bone [facility])   Malignant neoplasm of prostate (HCC)  06/13/2021 Initial Diagnosis   Malignant neoplasm of prostate (HCC)   11/19/2023 -  Chemotherapy   Patient is on Treatment Plan : PROSTATE Docetaxel (75) + Prednisone q21d       Chief Complaint: Prostate cancer   History of present illness:Philip [name] 78 y.o.  male with history of metastatic castrate resistant prostate cancer on apalutamide  plus Eligard  is here for follow-up/and review results of the PET scan.  Patient stated that he is feels well.  Denies any worsening pain.  He is walking independently of the walker.  Denies any chest pain or shortness of breath or cough.  No fever no chills.  He continues living [facility].  Accompanied by brother.  Observation/objective: Alert & oriented x 3. In No acute distress.   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 DEC 2023-multiple bone lesions noted.  DEC 2023-CT scan CAP- no visceral metastases. PSA rising at 65 [NOV 2023].  On apalutamide  4 pills a day [dissolved in water; mid NOV 2023]. DECLINED Zometa. Currently on apalutamide - AUG 19th, 2024- PSMA PET scan-   Intense radiotracer activity involving the sacrum and RIGHT iliac bone consistent with active skeletal metastasis.  No evidence of local prostate carcinoma recurrence in the prostate bed. No evidence of metastatic adenopathy in the pelvis or periaortic retroperitoneum; No evidence of visceral metastasis or pulmonary metastasis. NGS blood work/tempus- AUG 2024-  O targets.   # Patient Currently apalutamide  4 pills a day [dissolved in water]-fairly well. NOV  2024- PSA-200;  BUT MARCH  2025- PSA 19.5/rising ;  MAY 2025- PET scan- Interval improvement in tracer avid tumor involving the sacrum and posterior right iliac bone; New/progressive foci of tracer avid bone metastasis are identified involving the left second rib, right iliac bone and right acetabulum;  New tracer avid retroperitoneal and right iliac chain nodal metastasis;  New focus of increased uptake within segment 4 of the liver without corresponding CT abnormality, equivocal for liver metastasis.   # Discussed re: options of Taxotere vs pluvicto.  I would prefer Taxotere [60 mg/m every 3 weeks] as patient has not had Taxotere before.  And also given the concern for liver metastasis. I reviewed at length the individual components with chemotherapy; and the schedule in detail.  I also discussed the potential side effects including but not limited to-increasing fatigue, nausea vomiting, diarrhea, hair loss, sores in the mouth, increase risk of infection and also neuropathy.  # After long discussion patient seems to be leaning towards chemotherapy however has not made a decision check.  He states that he would need some time to discuss; and he will get back to us  of his decision at next visit.  Interested in chemotherapy education at this time.  For NOW- Continue Eligard  q6 M + apalutamide ;   # AUG PET scan 2024-: Intense radiotracer activity involving the sacrum and RIGHT iliac bone consistent with active skeletal metastasis. s/p Radiation [finished  25th, SEP 2024]- monitor for now- stable.   # Urinary obstruction -s/p TURP [OCT 2023; Dr.Brandon]- stable  # Dysphagia- [> 30 years-] ?  Esophageal spasms status post barium swallow. stable Continue follow-up with GI.   stable  #Parkinson's-[Dr.Potter]-  on sinemet . stable  # ACP: Patient does not want any aggressive/heroic measures.  He is interested in DNR.  Updated.  He will bring his documents from [employer] next visit.  *Eligard  45mg - JAN 13th 2025;  [facility] *transport-   # DISPOSITION: # chemo education- re: taxotere # mid JULY 2025- MD; 2-3 day PRIOR labs- cbc/cmp;PSA; Eligard - Dr.B   Follow-up instructions:  I discussed the assessment and treatment plan with the patient.  The patient was provided an opportunity to ask questions and all were answered.  The patient agreed with the plan and demonstrated understanding of instructions.  The patient was advised to call back or seek an in person evaluation if the symptoms worsen or if the condition fails to improve as anticipated.    Dr. Hedda Liv [facility] at [facility] 11/18/2023 4:27 PM

## 2023-11-18 NOTE — Progress Notes (Signed)
 PET 10/14/23.  Is he getting the eligard  today?

## 2023-11-18 NOTE — Progress Notes (Signed)
START ON PATHWAY REGIMEN - Prostate ? ? ?  A cycle is every 21 days: ?    Prednisone  ?    Docetaxel  ? ?**Always confirm dose/schedule in your pharmacy ordering system** ? ?Patient Characteristics: ?Adenocarcinoma, Recurrent/New Systemic Disease (Including Biochemical Recurrence), Castration Resistant, M1, Prior Novel Hormonal Agent, No Molecular Alteration or Targeted Therapy Exhausted, No Prior Docetaxel ?Histology: Adenocarcinoma ?Therapeutic Status: Recurrent/New Systemic Disease (Including Biochemical Recurrence) ? ?Intent of Therapy: ?Non-Curative / Palliative Intent, Discussed with Patient ?

## 2023-11-18 NOTE — Assessment & Plan Note (Addendum)
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 DEC 2023-multiple bone lesions noted.  DEC 2023-CT scan CAP- no visceral metastases. PSA rising at 65 [NOV 2023].  On apalutamide  4 pills a day [dissolved in water; mid NOV 2023]. DECLINED Zometa. Currently on apalutamide - AUG 19th, 2024- PSMA PET scan-  Intense radiotracer activity involving the sacrum and RIGHT iliac bone consistent with active skeletal metastasis.  No evidence of local prostate carcinoma recurrence in the prostate bed. No evidence of metastatic adenopathy in the pelvis or periaortic retroperitoneum; No evidence of visceral metastasis or pulmonary metastasis. NGS blood work/tempus- AUG 2024-  O targets.   # Patient Currently apalutamide  4 pills a day [dissolved in water]-fairly well. NOV  2024- PSA-200;  BUT MARCH  2025- PSA 19.5/rising ;  MAY 2025- PET scan- Interval improvement in tracer avid tumor involving the sacrum and posterior right iliac bone; New/progressive foci of tracer avid bone metastasis are identified involving the left second rib, right iliac bone and right acetabulum;  New tracer avid retroperitoneal and right iliac chain nodal metastasis;  New focus of increased uptake within segment 4 of the liver without corresponding CT abnormality, equivocal for liver metastasis.   # Discussed re: options of Taxotere vs pluvicto.  I would prefer Taxotere [60 mg/m every 3 weeks] as patient has not had Taxotere before.  And also given the concern for liver metastasis. I reviewed at length the individual components with chemotherapy; and the schedule in detail.  I also discussed the potential side effects including but not limited to-increasing fatigue, nausea vomiting, diarrhea, hair loss, sores in the mouth, increase risk of infection and also neuropathy.  # After long discussion patient seems to be leaning towards chemotherapy however has not made a decision check.  He  states that he would need some time to discuss; and he will get back to us  of his decision at next visit.  Interested in chemotherapy education at this time.  For NOW- Continue Eligard  q6 M + apalutamide ;   # AUG PET scan 2024-: Intense radiotracer activity involving the sacrum and RIGHT iliac bone consistent with active skeletal metastasis. s/p Radiation [finished 25th, SEP 2024]- monitor for now- stable.   # Urinary obstruction -s/p TURP [OCT 2023; Dr.Brandon]- stable  # Dysphagia- [> 30 years-] ?  Esophageal spasms status post barium swallow. stable Continue follow-up with GI.   stable  #Parkinson's-[Dr.Potter]-  on sinemet . stable  # ACP: Patient does not want any aggressive/heroic measures.  He is interested in DNR.  Updated.  He will bring his documents from [employer] next visit.  *Eligard  45mg - JAN 13th 2025;  [facility] *transport-   # DISPOSITION: # chemo education- re: taxotere # mid JULY 2025- MD; 2-3 day PRIOR labs- cbc/cmp;PSA; Eligard - Dr.B

## 2023-11-20 ENCOUNTER — Other Ambulatory Visit: Payer: Self-pay

## 2023-11-20 ENCOUNTER — Other Ambulatory Visit

## 2023-11-24 ENCOUNTER — Inpatient Hospital Stay

## 2023-11-24 DIAGNOSIS — C61 Malignant neoplasm of prostate: Secondary | ICD-10-CM

## 2023-11-24 NOTE — Progress Notes (Signed)
 [facility] CSW Progress Note  Clinical Social Work introduced self to patient during Patient Education with Octavio Ben, Charity fundraiser.  Provided information regarding CSW role, including counseling, advanced care planning and support group.  Answered questions as needed.  Kennth Peal, LCSW Clinical Social Worker [facility]

## 2023-11-24 NOTE — Addendum Note (Signed)
 Addended by: Octavio Ben B on: 11/24/2023 11:58 AM   Modules accepted: Orders

## 2023-11-25 ENCOUNTER — Inpatient Hospital Stay: Admitting: Licensed Clinical Social Worker

## 2023-11-25 ENCOUNTER — Telehealth: Payer: Self-pay

## 2023-11-25 NOTE — Progress Notes (Signed)
 [facility] Psychosocial Distress Screening Clinical Social Work  Philip [name] is a 78 y.o. year old male contacted by phone. Clinical Social Work was referred by nurse for positive distress screening. The patient scored a 5 on the Psychosocial Distress Thermometer which indicates moderate distress. Clinical Social Worker contacted patient by phone to assess for distress and other psychosocial needs.     Distress Screen:    11/24/2023   11:57 AM  ONCBCN DISTRESS SCREENING  Screening Type Initial Screening  How much distress have you been experiencing in the past week? (0-10) 5     CSW and patient discussed common feeling and emotions when being diagnosed with cancer, and the importance of support during treatment.  CSW informed patient of the support team and support services at [facility].  CSW provided contact information and encouraged patient to call with any questions or concerns.   Follow Up Plan: CSW will follow-up with patient by phone  Patient verbalizes understanding of plan: Yes    Philip Jesus Octa Uplinger, LCSW

## 2023-11-25 NOTE — Telephone Encounter (Signed)
 Clinical Social Work was referred by medical provider for assessment of psychosocial needs.  CSW attempted to contact patient by phone.  Left voicemail with contact information and request for return call.

## 2023-12-21 ENCOUNTER — Other Ambulatory Visit

## 2023-12-22 ENCOUNTER — Inpatient Hospital Stay: Attending: Internal Medicine

## 2023-12-22 DIAGNOSIS — C7951 Secondary malignant neoplasm of bone: Secondary | ICD-10-CM | POA: Diagnosis present

## 2023-12-22 DIAGNOSIS — C61 Malignant neoplasm of prostate: Secondary | ICD-10-CM | POA: Insufficient documentation

## 2023-12-22 LAB — CBC WITH DIFFERENTIAL (CANCER CENTER ONLY)
Abs Immature Granulocytes: 0.01 K/uL (ref 0.00–0.07)
Basophils Absolute: 0 K/uL (ref 0.0–0.1)
Basophils Relative: 1 %
Eosinophils Absolute: 0.1 K/uL (ref 0.0–0.5)
Eosinophils Relative: 1 %
HCT: 38.5 % — ABNORMAL LOW (ref 39.0–52.0)
Hemoglobin: 13.2 g/dL (ref 13.0–17.0)
Immature Granulocytes: 0 %
Lymphocytes Relative: 20 %
Lymphs Abs: 1 K/uL (ref 0.7–4.0)
MCH: 31.1 pg (ref 26.0–34.0)
MCHC: 34.3 g/dL (ref 30.0–36.0)
MCV: 90.8 fL (ref 80.0–100.0)
Monocytes Absolute: 0.6 K/uL (ref 0.1–1.0)
Monocytes Relative: 11 %
Neutro Abs: 3.5 K/uL (ref 1.7–7.7)
Neutrophils Relative %: 67 %
Platelet Count: 273 K/uL (ref 150–400)
RBC: 4.24 MIL/uL (ref 4.22–5.81)
RDW: 12.5 % (ref 11.5–15.5)
WBC Count: 5.3 K/uL (ref 4.0–10.5)
nRBC: 0 % (ref 0.0–0.2)

## 2023-12-22 LAB — CMP (CANCER CENTER ONLY)
ALT: <5 U/L (ref 0–44)
AST: 18 U/L (ref 15–41)
Albumin: 4 g/dL (ref 3.5–5.0)
Alkaline Phosphatase: 53 U/L (ref 38–126)
Anion gap: 10 (ref 5–15)
BUN: 24 mg/dL — ABNORMAL HIGH (ref 8–23)
CO2: 28 mmol/L (ref 22–32)
Calcium: 9.4 mg/dL (ref 8.9–10.3)
Chloride: 101 mmol/L (ref 98–111)
Creatinine: 0.88 mg/dL (ref 0.61–1.24)
GFR, Estimated: >60 mL/min (ref >60)
Glucose, Bld: 79 mg/dL (ref 70–99)
Potassium: 4.2 mmol/L (ref 3.5–5.1)
Sodium: 139 mmol/L (ref 135–145)
Total Bilirubin: 0.6 mg/dL (ref 0.0–1.2)
Total Protein: 7 g/dL (ref 6.5–8.1)

## 2023-12-22 LAB — PSA: Prostatic Specific Antigen: 37.23 ng/mL — ABNORMAL HIGH (ref 0.00–4.00)

## 2023-12-23 ENCOUNTER — Ambulatory Visit: Admitting: Internal Medicine

## 2023-12-25 ENCOUNTER — Inpatient Hospital Stay

## 2023-12-25 ENCOUNTER — Encounter: Payer: Self-pay | Admitting: Internal Medicine

## 2023-12-25 ENCOUNTER — Inpatient Hospital Stay: Admitting: Internal Medicine

## 2023-12-25 VITALS — BP 124/70 | HR 77 | Temp 96.9°F | Resp 16 | Ht 68.0 in | Wt 133.2 lb

## 2023-12-25 DIAGNOSIS — C7951 Secondary malignant neoplasm of bone: Secondary | ICD-10-CM

## 2023-12-25 DIAGNOSIS — C61 Malignant neoplasm of prostate: Secondary | ICD-10-CM

## 2023-12-25 MED ORDER — LEUPROLIDE ACETATE (6 MONTH) 45 MG ~~LOC~~ KIT
45.0000 mg | PACK | Freq: Once | SUBCUTANEOUS | Status: AC
Start: 2023-12-25 — End: 2023-12-25
  Administered 2023-12-25: 45 mg via SUBCUTANEOUS
  Filled 2023-12-25: qty 45

## 2023-12-25 NOTE — Assessment & Plan Note (Addendum)
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 DEC 2023-multiple bone lesions noted.  DEC 2023-CT scan CAP- no visceral metastases. PSA rising at 65 [NOV 2023].  On apalutamide  4 pills a day [dissolved in water; mid NOV 2023]. DECLINED Zometa. Currently on apalutamide - AUG 19th, 2024- PSMA PET scan-  Intense radiotracer activity involving the sacrum and RIGHT iliac bone consistent with active skeletal metastasis.  No evidence of local prostate carcinoma recurrence in the prostate bed. No evidence of metastatic adenopathy in the pelvis or periaortic retroperitoneum; No evidence of visceral metastasis or pulmonary metastasis. NGS blood work/tempus- AUG 2024- O targets. S/p chemo education.    # Patient Currently feels good- NOV  2024- PSA-200;  BUT JULY 2025- PSA 37/rising ;  MAY 2025- PET scan- Interval improvement in tracer avid tumor involving the sacrum and posterior right iliac bone; New/progressive foci of tracer avid bone metastasis are identified involving the left second rib, right iliac bone and right acetabulum;  New tracer avid retroperitoneal and right iliac chain nodal metastasis;  New focus of increased uptake within segment 4 of the liver without corresponding CT abnormality, equivocal for liver metastasis. Would hold off any scans.   # Currently on apalutamide  4 pills a day [dissolved in water]-fairly well. Again reviewed the pro and cons of chemotherapy.  Discussed re: options of Taxotere vs pluvicto.  I would prefer Taxotere [60 mg/m every 3 weeks] as patient has not had Taxotere before.  And also given the concern for liver metastasis.  Again discussed the need for chemotherapy. Patient is reluctant.  For NOW- Continue Eligard  q6 M + apalutamide ;   # AUG PET scan 2024-: Intense radiotracer activity involving the sacrum and RIGHT iliac bone consistent with active skeletal metastasis. s/p Radiation [finished 25th, SEP  2024]- monitor for now- stable.   # Urinary obstruction -s/p TURP [OCT 2023; Dr.Brandon]- stable.   # Dysphagia- [> 30 years-] ?  Esophageal spasms status post barium swallow. stable Continue follow-up with GI.   stable  #Parkinson's-[Dr.Potter]-  on sinemet .stable  # ACP: s/p GOC- DNR.  Eigard 45mg - JAN 13th 2025;  [facility] *transport-   # DISPOSITION: # ELIGARD  today-  # in 2 months-- MD 2-3 days prior-; labs- cbc/cmp; PSA;  - Dr.B  # 40 minutes face-to-face with the patient discussing the above plan of care; more than 50% of time spent on prognosis/ natural history; counseling and coordination.

## 2023-12-25 NOTE — Progress Notes (Signed)
 C/o chest congestion, rattle in chest x2 days, no fever, nasal drainage.

## 2023-12-25 NOTE — Progress Notes (Signed)
 [facility] OFFICE PROGRESS NOTE  Patient Care Team: Bertrum Charlie CROME, MD as PCP - General (Family Medicine) Rennie Cindy SAUNDERS, MD as Consulting Physician (Oncology) [name], MD as Consulting Physician (Radiation Oncology)   Cancer Staging  No matching staging information was found for the patient.    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 s/p Radiation [finished 25th, SEP 2024]-Continue Erleda  #  MAY 2025- PET scan- Interval improvement in tracer avid tumor involving the sacrum and posterior right iliac bone; New/progressive foci of tracer avid bone metastasis are identified involving the left second rib, right iliac bone and right acetabulum;  New tracer avid retroperitoneal and right iliac chain nodal metastasis;  New focus of increased uptake within segment 4 of the liver without corresponding CT abnormality, equivocal for liver metastasis.   # Discussed re: options of Taxotere vs pluvicto.  I would prefer Taxotere [60 mg/m every 3 weeks]   # n Christmas Day 2022 he fell and was no found for a couple of days and was found to be dehydrated and hypothermic.  Patient was admitted to the ICU in [place] .  During hospitalization patient was found to have elevated PSA -  962 and mri showed extensive sclerosis in pelvic, sacral, spine, and scapula, prostate cancer metastatic to bone. Creatinine was 2.94 without normalization. He had severe sepsis and bacteruria.   At discharge patient was sent over to rehab.  Given the family in  [place]/brother-patient moved to [place] .  Prior to this episode patient was losing weight.  However patient had been very healthy prior to this episode.  He was living alone.  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  Esophagus spasms.     Prostate cancer metastatic to bone (HCC)  08/14/2021 Initial Diagnosis   Prostate cancer metastatic to bone [facility])   Malignant neoplasm of prostate (HCC)  06/13/2021 Initial Diagnosis   Malignant neoplasm of prostate (HCC)   11/19/2023 -  Chemotherapy   Patient is on Treatment Plan : PROSTATE Docetaxel (75) + Prednisone q21d        HISTORY OF PRESENT ILLNESS: Patient in a rolling walker.   Patient lives in assisted living/[facility]; accompanied by his brother.  Mando Blatz 78 y.o.  male pleasant patient above history of metastatic castrate resistant prostate cancer to the bone  currently on apalutamide  started in end of NOV, 2023, is here for a follow up-  C/o chest congestion, rattle in chest x2 days, no fever, nasal drainage. No fevers or chills. Not taken any medications.   Patient s/p Radiation to sacral area. No pain. Appetite is good. Walks over a mile aday with his walker in the assissted living.  However gaining weight.  Appetite is improving. No bowel or bladder incontinence.   Review of Systems  Constitutional:  Positive for malaise/fatigue and weight loss. Negative for chills, diaphoresis and fever.  HENT:  Negative for nosebleeds and sore throat.   Eyes:  Negative for double vision.  Respiratory:  Negative for cough, hemoptysis, sputum production, shortness of breath and wheezing.  Cardiovascular:  Negative for chest pain, palpitations, orthopnea and leg swelling.  Gastrointestinal:  Negative for abdominal pain, blood in stool, constipation, diarrhea, heartburn, melena, nausea and vomiting.  Genitourinary:  Negative for dysuria, frequency and urgency.  Musculoskeletal:  Negative for back pain and joint  pain.  Skin: Negative.  Negative for itching and rash.  Neurological:  Positive for weakness. Negative for dizziness, tingling, focal weakness and headaches.  Endo/Heme/Allergies:  Does not bruise/bleed easily.  Psychiatric/Behavioral:  Negative for depression. The patient is not nervous/anxious and does not have insomnia.       PAST MEDICAL HISTORY :  Past Medical History:  Diagnosis Date   Acute kidney failure (HCC)    Arthritis    Asthma    Cancer (HCC)    Dysphagia    Parkinson disease (HCC)    Prostate cancer (HCC)    Rhabdomyolysis     PAST SURGICAL HISTORY :   Past Surgical History:  Procedure Laterality Date   CARDIAC CATHETERIZATION     KNEE ARTHROSCOPY Right 2012   also lt knee unsure of date   TRANSURETHRAL RESECTION OF PROSTATE N/A 03/17/2022   Procedure: TRANSURETHRAL RESECTION OF THE PROSTATE (TURP);  Surgeon: Penne Knee, MD;  Location: [facility] ORS;  Service: Urology;  Laterality: N/A;    FAMILY HISTORY :   Family History  Problem Relation Age of Onset   Arthritis/Rheumatoid Mother    Stroke Father    Hemangiomas Brother     SOCIAL HISTORY:   Social History   Tobacco Use   Smoking status: Never   Smokeless tobacco: Never  Vaping Use   Vaping status: Never Used  Substance Use Topics   Alcohol use: Yes   Drug use: Never    ALLERGIES:  has no known allergies.  MEDICATIONS:  Current Outpatient Medications  Medication Sig Dispense Refill   acetaminophen  (TYLENOL ) 325 MG tablet Take 650 mg by mouth every 6 (six) hours as needed.     apalutamide  ([name] ) 60 MG tablet Take 4 tablets (240 mg total) by mouth daily. 120 tablet 2   calcium -vitamin D  (OSCAL WITH D) 500-5 MG-MCG tablet TAKE 1 TABLET BY MOUTH TWICE A DAY. 60 tablet 10   carbidopa -levodopa  (SINEMET  IR) 25-100 MG tablet Take 1 tablet by mouth 3 (three) times daily. 90 tablet 3   Leuprolide  Acetate (ELIGARD  Anza) Inject into the skin.     omeprazole  (PRILOSEC) 20 MG capsule Take 1 capsule (20  mg total) by mouth every morning. (Patient not taking: Reported on 12/25/2023) 90 capsule 3   No current facility-administered medications for this visit.    PHYSICAL EXAMINATION: ECOG PERFORMANCE STATUS: 2 - Symptomatic, <50% confined to bed  BP 124/70 (BP Location: Left Arm, Patient Position: Sitting, Cuff Size: Normal)   Pulse 77   Temp (!) 96.9 F (36.1 C) (Tympanic)   Resp 16   Ht 5' 8 (1.727 m)   Wt 133 lb 3.2 oz (60.4 kg)   SpO2 98%   BMI 20.25 kg/m   Filed Weights   12/25/23 0953  Weight: 133 lb 3.2 oz (60.4 kg)     Physical Exam Vitals and nursing note reviewed.  HENT:     Head: Normocephalic and atraumatic.     Mouth/Throat:     Pharynx: Oropharynx is clear.  Eyes:     Extraocular Movements: Extraocular movements intact.     Pupils: Pupils are equal, round, and reactive to light.  Cardiovascular:     Rate and Rhythm: Normal rate and  regular rhythm.  Pulmonary:     Comments: Decreased breath sounds bilaterally.  Abdominal:     Palpations: Abdomen is soft.  Musculoskeletal:        General: Normal range of motion.     Cervical back: Normal range of motion.  Skin:    General: Skin is warm.  Neurological:     General: No focal deficit present.     Mental Status: He is alert and oriented to person, place, and time.  Psychiatric:        Behavior: Behavior normal.        Judgment: Judgment normal.     LABORATORY DATA:  I have reviewed the data as listed    Component Value Date/Time   NA 139 12/22/2023 1020   NA 140 07/31/2021 1612   K 4.2 12/22/2023 1020   CL 101 12/22/2023 1020   CO2 28 12/22/2023 1020   GLUCOSE 79 12/22/2023 1020   BUN 24 (H) 12/22/2023 1020   BUN 25 07/31/2021 1612   CREATININE 0.88 12/22/2023 1020   CALCIUM  9.4 12/22/2023 1020   PROT 7.0 12/22/2023 1020   PROT 6.5 07/31/2021 1612   ALBUMIN 4.0 12/22/2023 1020   ALBUMIN 4.3 07/31/2021 1612   AST 18 12/22/2023 1020   ALT <5 12/22/2023 1020   ALKPHOS 53 12/22/2023 1020    BILITOT 0.6 12/22/2023 1020   GFRNONAA >60 12/22/2023 1020    No results found for: SPEP, UPEP  Lab Results  Component Value Date   WBC 5.3 12/22/2023   NEUTROABS 3.5 12/22/2023   HGB 13.2 12/22/2023   HCT 38.5 (L) 12/22/2023   MCV 90.8 12/22/2023   PLT 273 12/22/2023      Chemistry      Component Value Date/Time   NA 139 12/22/2023 1020   NA 140 07/31/2021 1612   K 4.2 12/22/2023 1020   CL 101 12/22/2023 1020   CO2 28 12/22/2023 1020   BUN 24 (H) 12/22/2023 1020   BUN 25 07/31/2021 1612   CREATININE 0.88 12/22/2023 1020      Component Value Date/Time   CALCIUM  9.4 12/22/2023 1020   ALKPHOS 53 12/22/2023 1020   AST 18 12/22/2023 1020   ALT <5 12/22/2023 1020   BILITOT 0.6 12/22/2023 1020      Latest Reference Range & Units 10/23/21 10:12 12/04/21 10:29 02/05/22 14:38 03/31/22 11:28 04/24/22 13:19 05/19/22 08:57 06/20/22 09:57 08/21/22 08:29 10/21/22 13:06 12/25/22 10:36 02/02/23 09:54 03/05/23 09:30 04/09/23 09:37 05/11/23 09:52 06/22/23 14:11 08/20/23 09:48  Prostatic Specific Antigen 0.00 - 4.00 ng/mL 16.55 (H) 8.25 (H) 15.66 (H) 35.24 (H) 65.39 (H) 12.85 (H) 3.90 1.69 3.07 15.67 (H) 31.46 (H) 62.19 (H) 20.94 (H) 11.94 (H) 9.45 (H) 18.59 (H)  (H): Data is abnormally high  RADIOGRAPHIC STUDIES: I have personally reviewed the radiological images as listed and agreed with the findings in the report. No results found.   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 DEC 2023-multiple bone lesions noted.  DEC 2023-CT scan CAP- no visceral metastases. PSA rising at 65 [NOV 2023].  On apalutamide  4 pills a day [dissolved in water; mid NOV 2023]. DECLINED Zometa. Currently on apalutamide - AUG 19th, 2024- PSMA PET scan-  Intense radiotracer activity involving the sacrum and RIGHT iliac bone consistent with active skeletal metastasis.  No  evidence of local prostate carcinoma recurrence in the prostate bed. No  evidence of metastatic adenopathy in the pelvis or periaortic retroperitoneum; No evidence of visceral metastasis or pulmonary metastasis. NGS blood work/tempus- AUG 2024- O targets. S/p chemo education.    # Patient Currently feels good- NOV  2024- PSA-200;  BUT JULY 2025- PSA 37/rising ;  MAY 2025- PET scan- Interval improvement in tracer avid tumor involving the sacrum and posterior right iliac bone; New/progressive foci of tracer avid bone metastasis are identified involving the left second rib, right iliac bone and right acetabulum;  New tracer avid retroperitoneal and right iliac chain nodal metastasis;  New focus of increased uptake within segment 4 of the liver without corresponding CT abnormality, equivocal for liver metastasis. Would hold off any scans.   # Currently on apalutamide  4 pills a day [dissolved in water]-fairly well. Again reviewed the pro and cons of chemotherapy.  Discussed re: options of Taxotere vs pluvicto.  I would prefer Taxotere [60 mg/m every 3 weeks] as patient has not had Taxotere before.  And also given the concern for liver metastasis.  Again discussed the need for chemotherapy. Patient is reluctant.  For NOW- Continue Eligard  q6 M + apalutamide ;   # AUG PET scan 2024-: Intense radiotracer activity involving the sacrum and RIGHT iliac bone consistent with active skeletal metastasis. s/p Radiation [finished 25th, SEP 2024]- monitor for now- stable.   # Urinary obstruction -s/p TURP [OCT 2023; Dr.Brandon]- stable.   # Dysphagia- [> 30 years-] ?  Esophageal spasms status post barium swallow. stable Continue follow-up with GI.   stable  #Parkinson's-[Dr.Potter]-  on sinemet .stable  # ACP: s/p GOC- DNR.  Eigard 45mg - JAN 13th 2025;  [facility] *transport-   # DISPOSITION: # ELIGARD  today-  # in 2 months-- MD 2-3 days prior-; labs- cbc/cmp; PSA;  - Dr.B  # 40 minutes face-to-face  with the patient discussing the above plan of care; more than 50% of time spent on prognosis/ natural history; counseling and coordination.      Orders Placed This Encounter  Procedures   CBC with Differential (Cancer Center Only)    Standing Status:   Future    Expected Date:   06/26/2024    Expiration Date:   12/24/2024   CMP (Cancer Center only)    Standing Status:   Future    Expected Date:   06/26/2024    Expiration Date:   12/24/2024   PSA    Standing Status:   Future    Expected Date:   06/26/2024    Expiration Date:   12/24/2024   CBC with Differential (Cancer Center Only)    Standing Status:   Future    Expected Date:   02/25/2024    Expiration Date:   05/25/2024   CMP (Cancer Center only)    Standing Status:   Future    Expected Date:   02/25/2024    Expiration Date:   05/25/2024   PSA    Standing Status:   Future    Expected Date:   02/25/2024    Expiration Date:   05/25/2024   All questions were answered. The patient knows to call the clinic with any problems, questions or concerns.      Cindy JONELLE Joe, MD 12/25/2023 11:28 AM

## 2023-12-26 ENCOUNTER — Other Ambulatory Visit: Payer: Self-pay

## 2024-01-04 ENCOUNTER — Telehealth: Payer: Self-pay | Admitting: *Deleted

## 2024-01-04 NOTE — Telephone Encounter (Signed)
 Philip [name] is the little brother and takes care of him and he had to go to the dentist and Lynwood says he thought it was a 87-month cleaning for.  Lynwood says he stayed there in there 1-1/2 hours and so he asked somebody to tell why it is taking so long.  Did some stuff because they asked for $2600 that they.  Marinda says that he has 4 front teeth that may  not be taken out, he needs to fillings in the rear area that is decayed, and 2 teeth as infected it looks like.  And Lynwood says that it sounds like someone talk to the patient but he did not know anything about these teeth problems and should he have all of these things taking care of before getting chemotherapy.  He would like to talk to Dr. Rennie specifically. I assume that he stays  on the lupron  and oral med, but is he may get chemo taxotere but at this time , is all this for if he is getting treatment in the  near  for him.  Telephone [phone]

## 2024-01-04 NOTE — Progress Notes (Signed)
 Returned patient's brother phone call-unable to reach left a voicemail to call us  back.

## 2024-01-05 ENCOUNTER — Telehealth: Payer: Self-pay | Admitting: *Deleted

## 2024-01-05 NOTE — Telephone Encounter (Signed)
 Dr. Rennie tried to get in touch with him at 7 PM yesterday and he did not get it he just called back and see if you can call him at [phone] about chemo?

## 2024-01-06 ENCOUNTER — Other Ambulatory Visit: Payer: Self-pay | Admitting: Internal Medicine

## 2024-01-06 DIAGNOSIS — C7951 Secondary malignant neoplasm of bone: Secondary | ICD-10-CM

## 2024-01-08 ENCOUNTER — Other Ambulatory Visit: Payer: Self-pay | Admitting: *Deleted

## 2024-01-08 ENCOUNTER — Encounter: Payer: Self-pay | Admitting: Internal Medicine

## 2024-01-08 DIAGNOSIS — C61 Malignant neoplasm of prostate: Secondary | ICD-10-CM

## 2024-01-08 MED ORDER — APALUTAMIDE 60 MG PO TABS
240.0000 mg | ORAL_TABLET | Freq: Every day | ORAL | 2 refills | Status: DC
Start: 2024-01-08 — End: 2024-03-23

## 2024-01-08 MED ORDER — APALUTAMIDE 60 MG PO TABS: 240.0000 mg | ORAL_TABLET | Freq: Every day | ORAL | 2 refills | Status: DC

## 2024-01-08 NOTE — Progress Notes (Signed)
 Spoke to brother regarding the concerns for dental clearance.  As patient is leaning towards chemotherapy in the future however recommend dental workup prior to chemo/Zometa.

## 2024-01-19 ENCOUNTER — Other Ambulatory Visit: Payer: Self-pay | Admitting: Internal Medicine

## 2024-02-23 ENCOUNTER — Inpatient Hospital Stay: Attending: Internal Medicine

## 2024-02-23 DIAGNOSIS — C7951 Secondary malignant neoplasm of bone: Secondary | ICD-10-CM | POA: Diagnosis not present

## 2024-02-23 DIAGNOSIS — C61 Malignant neoplasm of prostate: Secondary | ICD-10-CM | POA: Insufficient documentation

## 2024-02-23 DIAGNOSIS — Z9079 Acquired absence of other genital organ(s): Secondary | ICD-10-CM | POA: Insufficient documentation

## 2024-02-23 DIAGNOSIS — Z5111 Encounter for antineoplastic chemotherapy: Secondary | ICD-10-CM | POA: Insufficient documentation

## 2024-02-23 DIAGNOSIS — G20A1 Parkinson's disease without dyskinesia, without mention of fluctuations: Secondary | ICD-10-CM | POA: Diagnosis not present

## 2024-02-23 DIAGNOSIS — J45909 Unspecified asthma, uncomplicated: Secondary | ICD-10-CM | POA: Insufficient documentation

## 2024-02-23 DIAGNOSIS — R1310 Dysphagia, unspecified: Secondary | ICD-10-CM | POA: Insufficient documentation

## 2024-02-23 DIAGNOSIS — Z923 Personal history of irradiation: Secondary | ICD-10-CM | POA: Insufficient documentation

## 2024-02-23 DIAGNOSIS — M1990 Unspecified osteoarthritis, unspecified site: Secondary | ICD-10-CM | POA: Diagnosis not present

## 2024-02-23 DIAGNOSIS — Z7952 Long term (current) use of systemic steroids: Secondary | ICD-10-CM | POA: Insufficient documentation

## 2024-02-23 DIAGNOSIS — R635 Abnormal weight gain: Secondary | ICD-10-CM | POA: Insufficient documentation

## 2024-02-23 DIAGNOSIS — N139 Obstructive and reflux uropathy, unspecified: Secondary | ICD-10-CM | POA: Insufficient documentation

## 2024-02-23 LAB — CBC WITH DIFFERENTIAL (CANCER CENTER ONLY)
Abs Immature Granulocytes: 0.02 K/uL (ref 0.00–0.07)
Basophils Absolute: 0.1 K/uL (ref 0.0–0.1)
Basophils Relative: 1 %
Eosinophils Absolute: 0.2 K/uL (ref 0.0–0.5)
Eosinophils Relative: 3 %
HCT: 38.6 % — ABNORMAL LOW (ref 39.0–52.0)
Hemoglobin: 13 g/dL (ref 13.0–17.0)
Immature Granulocytes: 0 %
Lymphocytes Relative: 22 %
Lymphs Abs: 1.1 K/uL (ref 0.7–4.0)
MCH: 30.9 pg (ref 26.0–34.0)
MCHC: 33.7 g/dL (ref 30.0–36.0)
MCV: 91.7 fL (ref 80.0–100.0)
Monocytes Absolute: 0.7 K/uL (ref 0.1–1.0)
Monocytes Relative: 14 %
Neutro Abs: 3.1 K/uL (ref 1.7–7.7)
Neutrophils Relative %: 60 %
Platelet Count: 249 K/uL (ref 150–400)
RBC: 4.21 MIL/uL — ABNORMAL LOW (ref 4.22–5.81)
RDW: 12.9 % (ref 11.5–15.5)
WBC Count: 5.1 K/uL (ref 4.0–10.5)
nRBC: 0 % (ref 0.0–0.2)

## 2024-02-23 LAB — CMP (CANCER CENTER ONLY)
ALT: 6 U/L (ref 0–44)
AST: 17 U/L (ref 15–41)
Albumin: 3.9 g/dL (ref 3.5–5.0)
Alkaline Phosphatase: 78 U/L (ref 38–126)
Anion gap: 9 (ref 5–15)
BUN: 23 mg/dL (ref 8–23)
CO2: 28 mmol/L (ref 22–32)
Calcium: 9.5 mg/dL (ref 8.9–10.3)
Chloride: 101 mmol/L (ref 98–111)
Creatinine: 1.13 mg/dL (ref 0.61–1.24)
GFR, Estimated: >60 mL/min (ref >60)
Glucose, Bld: 92 mg/dL (ref 70–99)
Potassium: 4.7 mmol/L (ref 3.5–5.1)
Sodium: 138 mmol/L (ref 135–145)
Total Bilirubin: 0.7 mg/dL (ref 0.0–1.2)
Total Protein: 6.4 g/dL — ABNORMAL LOW (ref 6.5–8.1)

## 2024-02-23 LAB — PSA: Prostatic Specific Antigen: 77.85 ng/mL — ABNORMAL HIGH (ref 0.00–4.00)

## 2024-02-25 IMAGING — RF DG ESOPHAGUS
8 series · 14 of 24 positions shown · non-contrast
Comparison: None Available.

CLINICAL DATA: Dysphagia

EXAM:
ESOPHAGUS/BARIUM SWALLOW/TABLET STUDY
TECHNIQUE: Combined double and single contrast examination was performed using
effervescent crystals, high-density barium, and thin liquid barium.
This exam was performed by Quirijn Amazigh, PA, and was supervised and
interpreted by Dr. Oxana Lieber.
FLUOROSCOPY:
Radiation Exposure Index (as provided by the fluoroscopic device):
10.2 mGy

[Series 1: cp_standard · 0.18mm/px · 1 of 1 slices shown (1 of 8)]
[im 1/1]
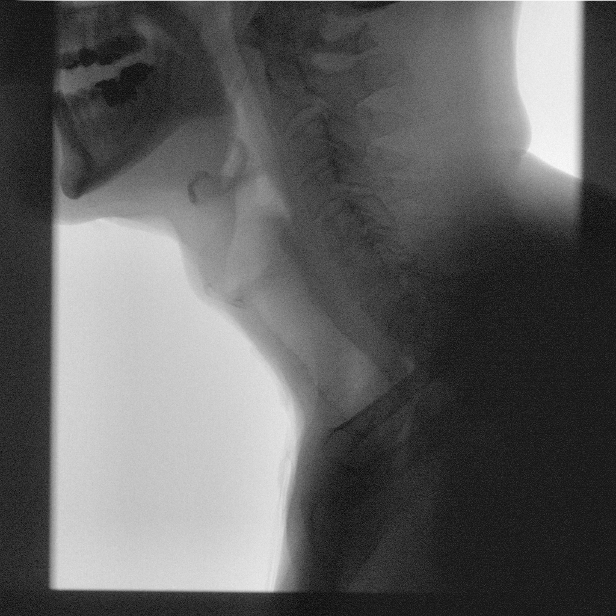

[Series 2: cp_standard · 0.18mm/px · 1 of 109 frames shown (2 of 8)]
[frame 17/109]
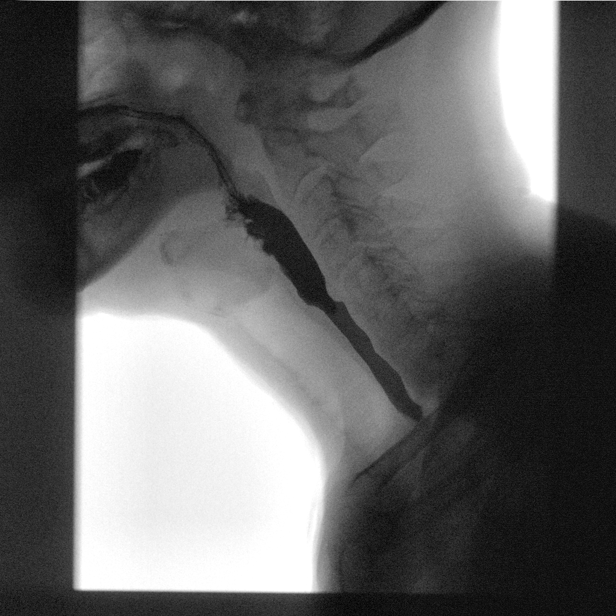

[Series 3: cp_standard · 0.18mm/px · 2 of 53 frames shown (3 of 8)]
[frame 1/53]
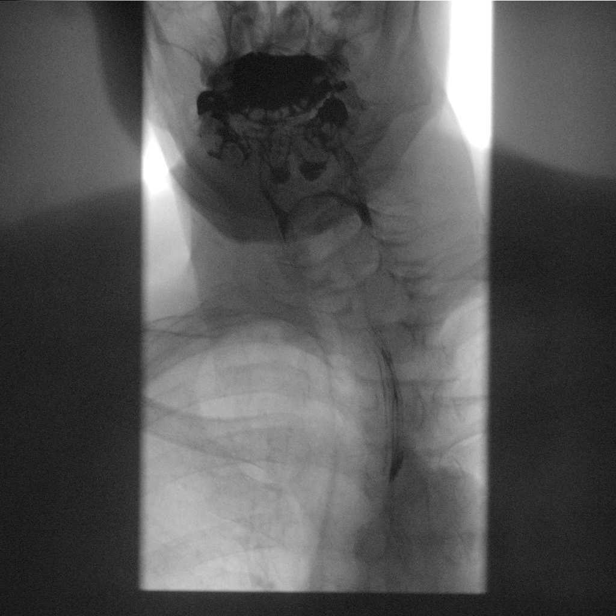
[frame 27/53]
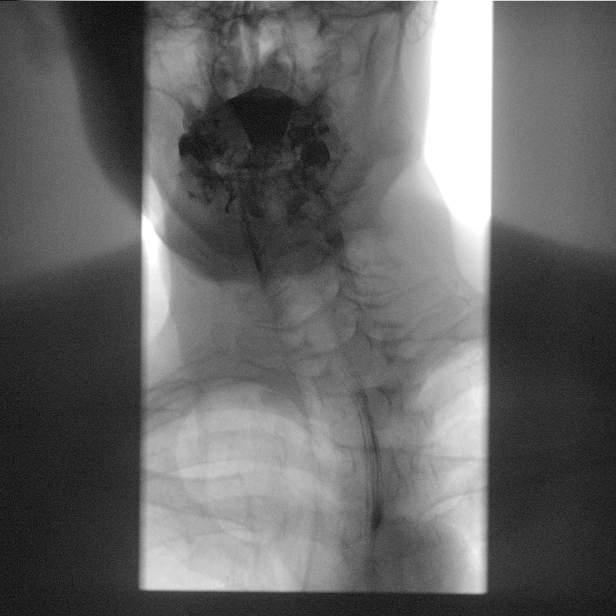

[Series 4: cp_standard · 0.28mm/px · 2 of 57 frames shown (4 of 8)]
[frame 7/57]
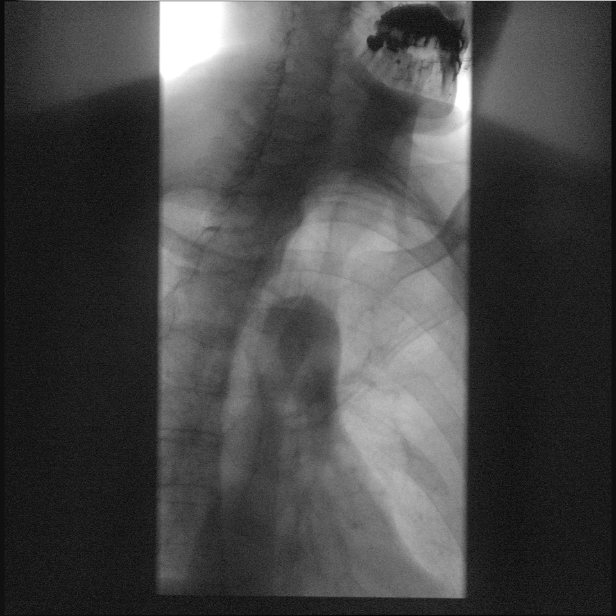
[frame 29/57]
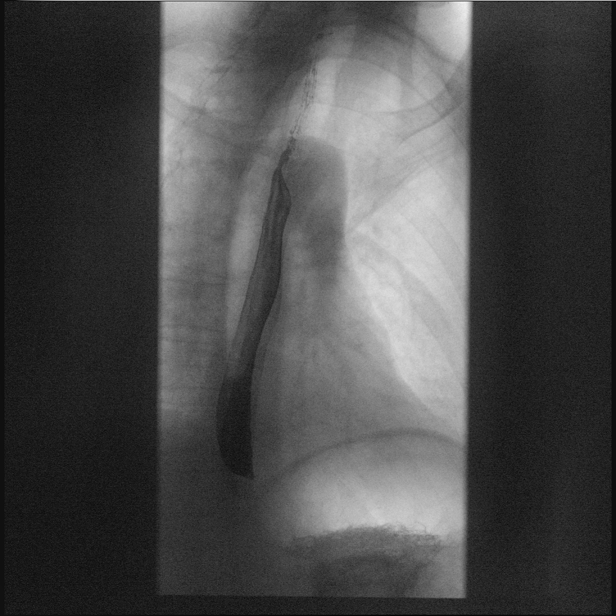

[Series 5: cp_standard · 0.28mm/px · 2 of 238 frames shown (5 of 8)]
[frame 36/238]
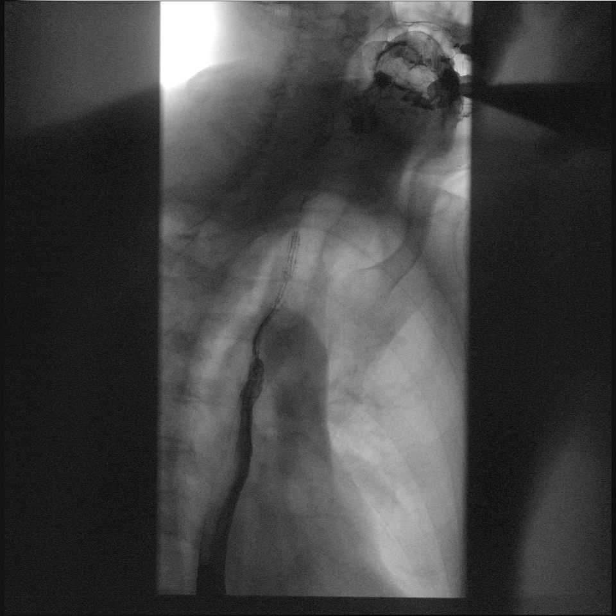
[frame 186/238]
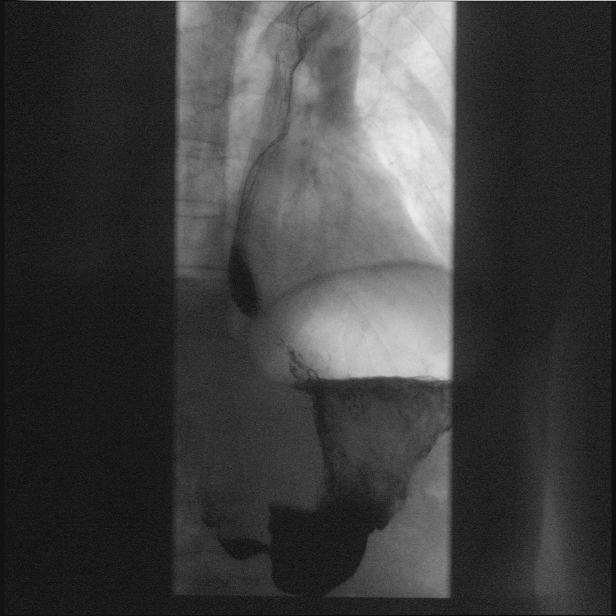

[Series 6: cp_standard · 0.28mm/px · 2 of 119 frames shown (6 of 8)]
[frame 5/119]
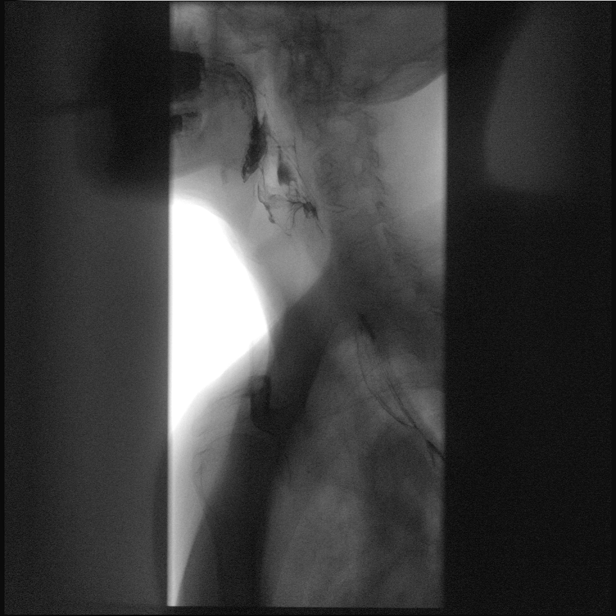
[frame 60/119]
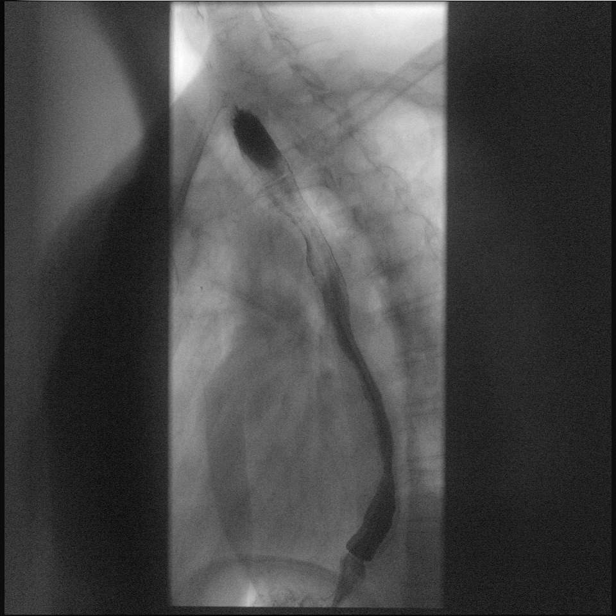

[Series 7: cp_standard · 0.28mm/px · 2 of 163 frames shown (7 of 8)]
[frame 82/163]
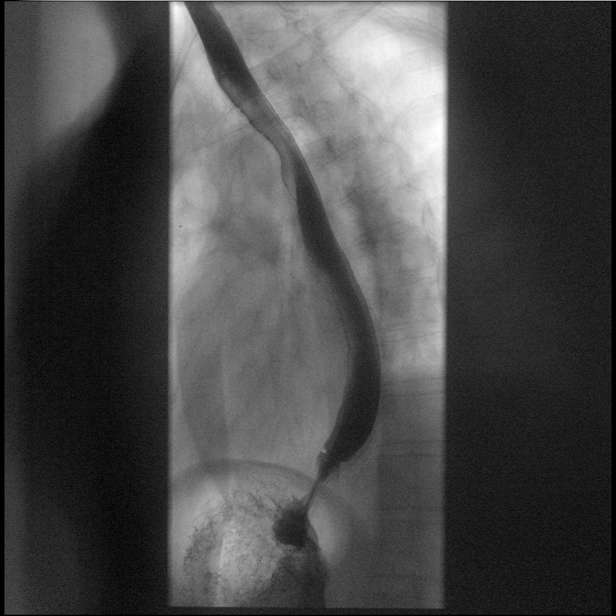
[frame 139/163]
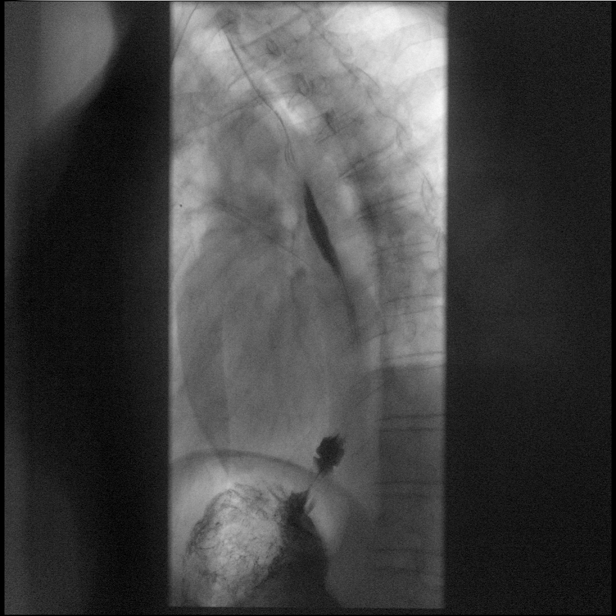

[Series 8: cp_standard · 0.27mm/px · 2 of 105 frames shown (8 of 8)]
[frame 53/105]
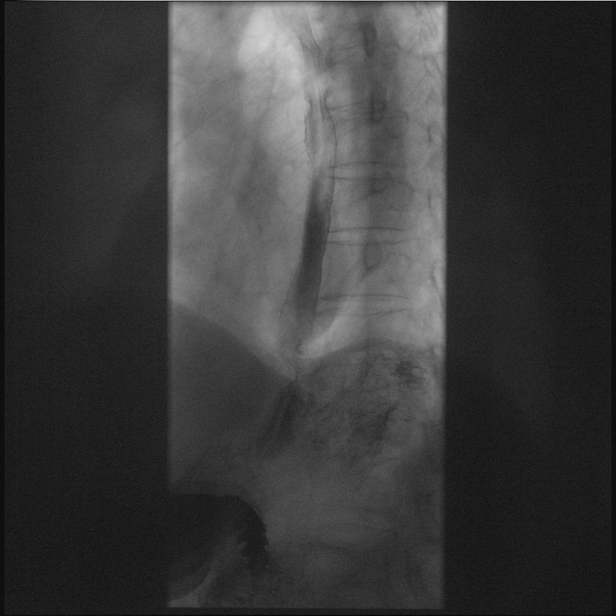
[frame 90/105]
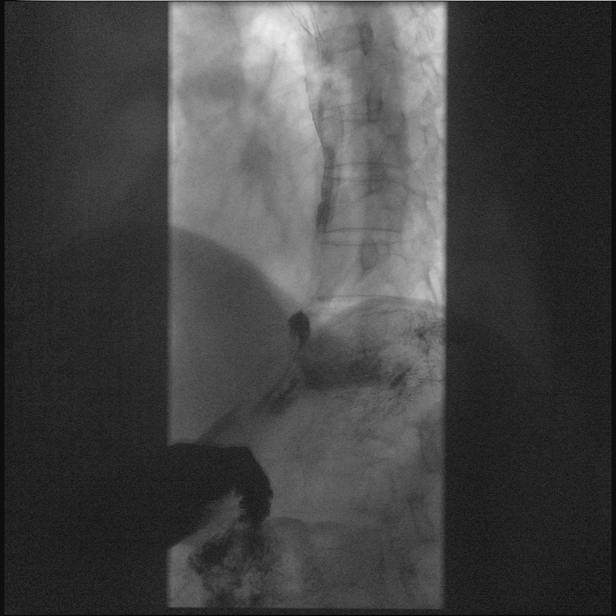

[14 of 24 positions shown; findings below may reference images not displayed]

FINDINGS: Normal pharyngeal anatomy and motility. Contrast flowed freely
through the esophagus without evidence of a stricture or mass.
Normal esophageal mucosa without evidence of irregularity or
ulceration. Tertiary contractions of the esophagus as can be seen
with presbyesophagus versus spasm. No evidence of reflux. Small
hiatal hernia.
IMPRESSION: 1. Tertiary contractions of the esophagus as can be seen with
presbyesophagus versus spasm.
2. Small hiatal hernia.

## 2024-02-26 ENCOUNTER — Encounter: Payer: Self-pay | Admitting: Internal Medicine

## 2024-02-26 ENCOUNTER — Inpatient Hospital Stay: Admitting: Internal Medicine

## 2024-02-26 VITALS — BP 132/75 | HR 80 | Temp 97.1°F | Resp 16 | Ht 68.0 in | Wt 133.8 lb

## 2024-02-26 DIAGNOSIS — Z5111 Encounter for antineoplastic chemotherapy: Secondary | ICD-10-CM | POA: Diagnosis not present

## 2024-02-26 DIAGNOSIS — C61 Malignant neoplasm of prostate: Secondary | ICD-10-CM | POA: Diagnosis not present

## 2024-02-26 DIAGNOSIS — C7951 Secondary malignant neoplasm of bone: Secondary | ICD-10-CM

## 2024-02-26 MED ORDER — DEXAMETHASONE 4 MG PO TABS: ORAL_TABLET | ORAL | 1 refills | Status: DC

## 2024-02-26 MED ORDER — ONDANSETRON HCL 8 MG PO TABS: 8.0000 mg | ORAL_TABLET | Freq: Three times a day (TID) | ORAL | 1 refills | Status: DC | PRN

## 2024-02-26 MED ORDER — PROCHLORPERAZINE MALEATE 10 MG PO TABS: 10.0000 mg | ORAL_TABLET | Freq: Four times a day (QID) | ORAL | 1 refills | Status: DC | PRN

## 2024-02-26 NOTE — Assessment & Plan Note (Addendum)
# -  De novo metastatic-  prostate cancer [Dx- DEC 2022;[place]] -CURRENTLY CASTRATE RESISTANT-currently on ADT+Apalutamide   # RISING PSA-  MAY 2025- PET scan- Interval improvement in tracer avid tumor involving the sacrum and posterior right iliac bone; New/progressive foci of tracer avid bone metastasis are identified involving the left second rib, right iliac bone and right acetabulum;  New tracer avid retroperitoneal and right iliac chain nodal metastasis;  New focus of increased uptake within segment 4 of the liver without corresponding CT abnormality, equivocal for liver metastasis.    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  Patient will get growth factor on day 2.  Will dose reduce to 60 mg/m. #  I reviewed at length the individual components with chemotherapy; and the schedule in detail. I also discussed the potential side effects including but not limited to-increasing fatigue, nausea vomiting, diarrhea, hair loss, sores in the mouth, increase risk of infection and also neuropathy.  Understands treatments are palliative not curative.  Discussed regarding dexamethasone  premedication.  # Urinary obstruction -s/p TURP [OCT 2023; Dr.Brandon]- stable.   # Dysphagia- [> 30 years-] ?  Esophageal spasms status post barium swallow. stable Continue follow-up with GI.  If issues with swallowing pills-recommend disintegrating Zofran .   #Parkinson's-[Dr.Potter]-  on sinemet .stable  # ACP: s/p GOC- DNR.  # Vaccinations- OK with flu shot/Pneumonia vaccinations   # IV access: PIV   Eigard 45mg - JULY 18th 2025;  [facility]transport-   # Follow up  in 1 week-APP-labs- chemo- d-2 injection x3 cycles  # DISPOSITION: # as per IS-  # in 1 weeks- labs- cbc/cmp; PSA- APP- chemo; D-2 injection   # in 4 weeks- labs- cbc/cmp; PSA- APP- chemo; D-2 injection   # in 7  weeks- labs- cbc/cmp; PSA-  MD;  chemo; D-2 injection - Dr.B

## 2024-02-26 NOTE — Progress Notes (Signed)
 Pharmacist Chemotherapy Monitoring - Initial Assessment    Anticipated start date: 03/03/24   The following has been reviewed per standard work regarding the patient's treatment regimen: The patient's diagnosis, treatment plan and drug doses, and organ/hematologic function Lab orders and baseline tests specific to treatment regimen  The treatment plan start date, drug sequencing, and pre-medications Prior authorization status  Patient's documented medication list, including drug-drug interaction screen and prescriptions for anti-emetics and supportive care specific to the treatment regimen The drug concentrations, fluid compatibility, administration routes, and timing of the medications to be used The patient's access for treatment and lifetime cumulative dose history, if applicable  The patient's medication allergies and previous infusion related reactions, if applicable   Changes made to treatment plan:  N/A  Follow up needed:  ? Start Zometa - msg'd Dr Rennie.   Philip [name], Pharm.D., CPP 02/26/2024@12 :03 PM

## 2024-02-26 NOTE — Progress Notes (Signed)
 [name] OFFICE PROGRESS NOTE  Patient Care Team: Philip Charlie CROME, MD as PCP - General (Family Medicine) Philip Cindy SAUNDERS, MD as Consulting Physician (Oncology) Philip Aran, MD as Consulting Physician (Radiation Oncology)   Cancer Staging  No matching staging information was found for the patient.    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 s/p Radiation [finished 25th, SEP 2024]-Continue Erleda  #  MAY 2025- PET scan- Interval improvement in tracer avid tumor involving the sacrum and posterior right iliac bone; New/progressive foci of tracer avid bone metastasis are identified involving the left second rib, right iliac bone and right acetabulum;  New tracer avid retroperitoneal and right iliac chain nodal metastasis;  New focus of increased uptake within segment 4 of the liver without corresponding CT abnormality, equivocal for liver metastasis.   # SEP 25th, 2025- start Taxotere [60 mg/m every 3 weeks]   # n Christmas Day 2022 he fell and was no found for a couple of days and was found to be dehydrated and hypothermic.  Patient was admitted to the ICU in [place] .  During hospitalization patient was found to have elevated PSA -  962 and mri showed extensive sclerosis in pelvic, sacral, spine, and scapula, prostate cancer metastatic to bone. Creatinine was 2.94 without normalization. He had severe sepsis and bacteruria.   At discharge patient was sent over to rehab.  Given the family in [place]/brother-patient moved to [place].  Prior to this episode patient was losing weight.  However patient had been very healthy prior to this episode.  He was living alone.  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     Prostate cancer metastatic to bone (HCC)  08/14/2021 Initial Diagnosis   Prostate cancer metastatic to bone [facility])   Malignant neoplasm of prostate (HCC)  06/13/2021 Initial Diagnosis   Malignant neoplasm of prostate (HCC)   03/03/2024 -  Chemotherapy   Patient is on Treatment Plan : PROSTATE Docetaxel (75) + Prednisone q21d        HISTORY OF PRESENT ILLNESS: Patient in a rolling walker.   Patient lives in assisted living/[facility]; accompanied by his brother.  Philip [name] 78 y.o.  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 Appetite is good  However gaining weight.  Appetite is improving. No bowel or bladder incontinence. [name] over a mile aday with his walker in the assissted living.  Review of Systems  Constitutional:  Positive for malaise/fatigue and weight loss. Negative for chills, diaphoresis and fever.  HENT:  Negative for nosebleeds and sore throat.   Eyes:  Negative for double vision.  Respiratory:  Negative for cough, hemoptysis, sputum production, shortness of breath and wheezing.   Cardiovascular:  Negative for chest pain, palpitations, orthopnea and leg swelling.  Gastrointestinal:  Negative for abdominal pain, blood in stool, constipation, diarrhea, heartburn, melena, nausea and vomiting.  Genitourinary:  Negative for  dysuria, frequency and urgency.  Musculoskeletal:  Negative for back pain and joint pain.  Skin: Negative.  Negative for itching and rash.  Neurological:  Positive for weakness. Negative for dizziness, tingling, focal weakness and headaches.  Endo/Heme/Allergies:  Does not  bruise/bleed easily.  Psychiatric/Behavioral:  Negative for depression. The patient is not nervous/anxious and does not have insomnia.       PAST MEDICAL HISTORY :  Past Medical History:  Diagnosis Date   Acute kidney failure (HCC)    Arthritis    Asthma    Cancer (HCC)    Dysphagia    Parkinson disease (HCC)    Prostate cancer (HCC)    Rhabdomyolysis     PAST SURGICAL HISTORY :   Past Surgical History:  Procedure Laterality Date   CARDIAC CATHETERIZATION     KNEE ARTHROSCOPY Right 2012   also lt knee unsure of date   TRANSURETHRAL RESECTION OF PROSTATE N/A 03/17/2022   Procedure: TRANSURETHRAL RESECTION OF THE PROSTATE (TURP);  Surgeon: Penne Knee, MD;  Location: [facility] ORS;  Service: Urology;  Laterality: N/A;    FAMILY HISTORY :   Family History  Problem Relation Age of Onset   Arthritis/Rheumatoid Mother    Stroke Father    Hemangiomas Brother     SOCIAL HISTORY:   Social History   Tobacco Use   Smoking status: Never   Smokeless tobacco: Never  Vaping Use   Vaping status: Never Used  Substance Use Topics   Alcohol use: Yes   Drug use: Never    ALLERGIES:  has no known allergies.  MEDICATIONS:  Current Outpatient Medications  Medication Sig Dispense Refill   acetaminophen  (TYLENOL ) 325 MG tablet Take 650 mg by mouth every 6 (six) hours as needed.     apalutamide  ([name] ) 60 MG tablet Take 4 tablets (240 mg total) by mouth daily. 120 tablet 2   calcium -vitamin D  (OSCAL WITH D) 500-5 MG-MCG tablet TAKE 1 TABLET BY MOUTH TWICE A DAY. 60 tablet 10   carbidopa -levodopa  (SINEMET  IR) 25-100 MG tablet Take 1 tablet by mouth 3 (three) times daily. (Patient taking differently: 3 (three) times daily. 2 with breakfast, 1 with lunch and 2 with dinner) 90 tablet 3   Leuprolide  Acetate (ELIGARD  [place]) Inject into the skin.     dexamethasone  (DECADRON ) 4 MG tablet Take 2 tabs by mouth 2 times daily starting day before chemo. Then take 2 tabs daily for 2 days starting day  after chemo. Take with food. 30 tablet 1   ondansetron  (ZOFRAN ) 8 MG tablet Take 1 tablet (8 mg total) by mouth every 8 (eight) hours as needed for nausea or vomiting. 30 tablet 1   prochlorperazine  (COMPAZINE ) 10 MG tablet Take 1 tablet (10 mg total) by mouth every 6 (six) hours as needed for nausea or vomiting. 30 tablet 1   No current facility-administered medications for this visit.    PHYSICAL EXAMINATION: ECOG PERFORMANCE STATUS: 2 - Symptomatic, <50% confined to bed  BP 132/75 (BP Location: Left Arm, Patient Position: Sitting, Cuff Size: Normal)   Pulse 80   Temp (!) 97.1 F (36.2 C) (Tympanic)   Resp 16   Ht 5' 8 (1.727 m)   Wt 133 lb 12.8 oz (60.7 kg)   SpO2 100%   BMI 20.34 kg/m   Filed Weights   02/26/24 0923  Weight: 133 lb 12.8 oz (60.7 kg)     Physical Exam Vitals and nursing note reviewed.  HENT:     Head: Normocephalic  and atraumatic.     Mouth/Throat:     Pharynx: Oropharynx is clear.  Eyes:     Extraocular Movements: Extraocular movements intact.     Pupils: Pupils are equal, round, and reactive to light.  Cardiovascular:     Rate and Rhythm: Normal rate and regular rhythm.  Pulmonary:     Comments: Decreased breath sounds bilaterally.  Abdominal:     Palpations: Abdomen is soft.  Musculoskeletal:        General: Normal range of motion.     Cervical back: Normal range of motion.  Skin:    General: Skin is warm.  Neurological:     General: No focal deficit present.     Mental Status: He is alert and oriented to person, place, and time.  Psychiatric:        Behavior: Behavior normal.        Judgment: Judgment normal.     LABORATORY DATA:  I have reviewed the data as listed    Component Value Date/Time   NA 138 02/23/2024 1004   NA 140 07/31/2021 1612   K 4.7 02/23/2024 1004   CL 101 02/23/2024 1004   CO2 28 02/23/2024 1004   GLUCOSE 92 02/23/2024 1004   BUN 23 02/23/2024 1004   BUN 25 07/31/2021 1612   CREATININE 1.13 02/23/2024  1004   CALCIUM  9.5 02/23/2024 1004   PROT 6.4 (L) 02/23/2024 1004   PROT 6.5 07/31/2021 1612   ALBUMIN 3.9 02/23/2024 1004   ALBUMIN 4.3 07/31/2021 1612   AST 17 02/23/2024 1004   ALT 6 02/23/2024 1004   ALKPHOS 78 02/23/2024 1004   BILITOT 0.7 02/23/2024 1004   GFRNONAA >60 02/23/2024 1004    No results found for: SPEP, UPEP  Lab Results  Component Value Date   WBC 5.1 02/23/2024   NEUTROABS 3.1 02/23/2024   HGB 13.0 02/23/2024   HCT 38.6 (L) 02/23/2024   MCV 91.7 02/23/2024   PLT 249 02/23/2024      Chemistry      Component Value Date/Time   NA 138 02/23/2024 1004   NA 140 07/31/2021 1612   K 4.7 02/23/2024 1004   CL 101 02/23/2024 1004   CO2 28 02/23/2024 1004   BUN 23 02/23/2024 1004   BUN 25 07/31/2021 1612   CREATININE 1.13 02/23/2024 1004      Component Value Date/Time   CALCIUM  9.5 02/23/2024 1004   ALKPHOS 78 02/23/2024 1004   AST 17 02/23/2024 1004   ALT 6 02/23/2024 1004   BILITOT 0.7 02/23/2024 1004      Latest Reference Range & Units 10/23/21 10:12 12/04/21 10:29 02/05/22 14:38 03/31/22 11:28 04/24/22 13:19 05/19/22 08:57 06/20/22 09:57 08/21/22 08:29 10/21/22 13:06 12/25/22 10:36 02/02/23 09:54 03/05/23 09:30 04/09/23 09:37 05/11/23 09:52 06/22/23 14:11 08/20/23 09:48  Prostatic Specific Antigen 0.00 - 4.00 ng/mL 16.55 (H) 8.25 (H) 15.66 (H) 35.24 (H) 65.39 (H) 12.85 (H) 3.90 1.69 3.07 15.67 (H) 31.46 (H) 62.19 (H) 20.94 (H) 11.94 (H) 9.45 (H) 18.59 (H)  (H): Data is abnormally high  RADIOGRAPHIC STUDIES: I have personally reviewed the radiological images as listed and agreed with the findings in the report. No results found.   ASSESSMENT & PLAN:  Prostate cancer metastatic to bone (HCC) # -De novo metastatic-  prostate cancer [Dx- DEC 2022;[place]] -CURRENTLY CASTRATE RESISTANT-currently on ADT+Apalutamide   # RISING PSA-  MAY 2025- PET scan- Interval improvement in tracer avid tumor involving the sacrum and posterior right iliac bone;  New/progressive foci  of tracer avid bone metastasis are identified involving the left second rib, right iliac bone and right acetabulum;  New tracer avid retroperitoneal and right iliac chain nodal metastasis;  New focus of increased uptake within segment 4 of the liver without corresponding CT abnormality, equivocal for liver metastasis.    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  Patient will get growth factor on day 2.  Will dose reduce to 60 mg/m. #  I reviewed at length the individual components with chemotherapy; and the schedule in detail. I also discussed the potential side effects including but not limited to-increasing fatigue, nausea vomiting, diarrhea, hair loss, sores in the mouth, increase risk of infection and also neuropathy.  Understands treatments are palliative not curative.  Discussed regarding dexamethasone  premedication.  # Urinary obstruction -s/p TURP [OCT 2023; Dr.Brandon]- stable.   # Dysphagia- [> 30 years-] ?  Esophageal spasms status post barium swallow. stable Continue follow-up with GI.  If issues with swallowing pills-recommend disintegrating Zofran .   #Parkinson's-[Dr.Potter]-  on sinemet .stable  # ACP: s/p GOC- DNR.  # Vaccinations- OK with flu shot/Pneumonia vaccinations   # IV access: PIV   Eigard 45mg - JULY 18th 2025;  [facility]transport-   # Follow up  in 1 week-APP-labs- chemo- d-2 injection x3 cycles  # DISPOSITION: # as per IS-  # in 1 weeks- labs- cbc/cmp; PSA- APP- chemo; D-2 injection   # in 4 weeks- labs- cbc/cmp; PSA- APP- chemo; D-2 injection   # in 7  weeks- labs- cbc/cmp; PSA-  MD;  chemo; D-2 injection - Dr.B      Orders Placed This Encounter  Procedures   CBC with Differential (Cancer [name] Only)    Standing Status:   Future    Expected Date:   03/03/2024    Expiration Date:   03/03/2025   CMP (Cancer [name] only)    Standing Status:   Future    Expected  Date:   03/03/2024    Expiration Date:   03/03/2025   CBC with Differential (Cancer [name] Only)    Standing Status:   Future    Expected Date:   03/24/2024    Expiration Date:   03/24/2025   CMP (Cancer [name] only)    Standing Status:   Future    Expected Date:   03/24/2024    Expiration Date:   03/24/2025   CBC with Differential (Cancer [name] Only)    Standing Status:   Future    Expected Date:   04/14/2024    Expiration Date:   04/14/2025   CMP (Cancer [name] only)    Standing Status:   Future    Expected Date:   04/14/2024    Expiration Date:   04/14/2025   All questions were answered. The patient knows to call the clinic with any problems, questions or concerns.      Cindy JONELLE Joe, MD 02/26/2024 12:35 PM

## 2024-02-26 NOTE — Addendum Note (Signed)
 Addended by: JOSHUA ALFONSO CROME on: 02/26/2024 01:31 PM   Modules accepted: Orders

## 2024-02-26 NOTE — Progress Notes (Signed)
 Can he have the flu and pneumonia vaccine?

## 2024-02-27 ENCOUNTER — Other Ambulatory Visit: Payer: Self-pay

## 2024-03-02 ENCOUNTER — Encounter: Payer: Self-pay | Admitting: Internal Medicine

## 2024-03-02 ENCOUNTER — Telehealth: Payer: Self-pay | Admitting: *Deleted

## 2024-03-02 NOTE — Telephone Encounter (Signed)
 He wanted to know when he is going to get done with his chemo based on having labs seeing the nurse practitioner and getting the treatment I would guess that he would be there for 4-1/2 hours from walking in the door to leaving.  Also the brother says that he will probably come in there and check on him but she wanted he wants to go back home until it is ready for him to get picked up.  I told him to tell the nurse that is the patient's nurse and asked them to do 20-minute call before so he could run in there and get get the patient and did not have to wait.  I told him to make sure he told talks to the nurse about that. Brother is ok with it

## 2024-03-03 ENCOUNTER — Inpatient Hospital Stay (HOSPITAL_BASED_OUTPATIENT_CLINIC_OR_DEPARTMENT_OTHER): Admitting: Nurse Practitioner

## 2024-03-03 ENCOUNTER — Encounter: Payer: Self-pay | Admitting: Internal Medicine

## 2024-03-03 ENCOUNTER — Inpatient Hospital Stay

## 2024-03-03 ENCOUNTER — Encounter: Payer: Self-pay | Admitting: Nurse Practitioner

## 2024-03-03 VITALS — BP 152/83 | HR 70 | Temp 96.0°F | Resp 19

## 2024-03-03 VITALS — BP 126/66 | HR 79 | Temp 98.2°F | Resp 16 | Wt 134.0 lb

## 2024-03-03 DIAGNOSIS — K5903 Drug induced constipation: Secondary | ICD-10-CM

## 2024-03-03 DIAGNOSIS — K521 Toxic gastroenteritis and colitis: Secondary | ICD-10-CM | POA: Diagnosis not present

## 2024-03-03 DIAGNOSIS — Z5111 Encounter for antineoplastic chemotherapy: Secondary | ICD-10-CM

## 2024-03-03 DIAGNOSIS — C61 Malignant neoplasm of prostate: Secondary | ICD-10-CM

## 2024-03-03 DIAGNOSIS — C7951 Secondary malignant neoplasm of bone: Secondary | ICD-10-CM

## 2024-03-03 DIAGNOSIS — T451X5A Adverse effect of antineoplastic and immunosuppressive drugs, initial encounter: Secondary | ICD-10-CM

## 2024-03-03 LAB — CMP (CANCER CENTER ONLY)
ALT: 6 U/L (ref 0–44)
AST: 24 U/L (ref 15–41)
Albumin: 4.1 g/dL (ref 3.5–5.0)
Alkaline Phosphatase: 82 U/L (ref 38–126)
Anion gap: 9 (ref 5–15)
BUN: 20 mg/dL (ref 8–23)
CO2: 26 mmol/L (ref 22–32)
Calcium: 9.5 mg/dL (ref 8.9–10.3)
Chloride: 101 mmol/L (ref 98–111)
Creatinine: 1.2 mg/dL (ref 0.61–1.24)
GFR, Estimated: >60 mL/min (ref >60)
Glucose, Bld: 109 mg/dL — ABNORMAL HIGH (ref 70–99)
Potassium: 3.9 mmol/L (ref 3.5–5.1)
Sodium: 136 mmol/L (ref 135–145)
Total Bilirubin: 0.7 mg/dL (ref 0.0–1.2)
Total Protein: 6.7 g/dL (ref 6.5–8.1)

## 2024-03-03 LAB — PSA: Prostatic Specific Antigen: 96.85 ng/mL — ABNORMAL HIGH (ref 0.00–4.00)

## 2024-03-03 LAB — CBC WITH DIFFERENTIAL (CANCER CENTER ONLY)
Abs Immature Granulocytes: 0.03 K/uL (ref 0.00–0.07)
Basophils Absolute: 0.1 K/uL (ref 0.0–0.1)
Basophils Relative: 1 %
Eosinophils Absolute: 0.1 K/uL (ref 0.0–0.5)
Eosinophils Relative: 2 %
HCT: 37.7 % — ABNORMAL LOW (ref 39.0–52.0)
Hemoglobin: 12.9 g/dL — ABNORMAL LOW (ref 13.0–17.0)
Immature Granulocytes: 0 %
Lymphocytes Relative: 19 %
Lymphs Abs: 1.6 K/uL (ref 0.7–4.0)
MCH: 30.6 pg (ref 26.0–34.0)
MCHC: 34.2 g/dL (ref 30.0–36.0)
MCV: 89.3 fL (ref 80.0–100.0)
Monocytes Absolute: 0.9 K/uL (ref 0.1–1.0)
Monocytes Relative: 11 %
Neutro Abs: 5.6 K/uL (ref 1.7–7.7)
Neutrophils Relative %: 67 %
Platelet Count: 270 K/uL (ref 150–400)
RBC: 4.22 MIL/uL (ref 4.22–5.81)
RDW: 13 % (ref 11.5–15.5)
WBC Count: 8.3 K/uL (ref 4.0–10.5)
nRBC: 0 % (ref 0.0–0.2)

## 2024-03-03 MED ORDER — SENNA 8.6 MG PO TABS: 1.0000 | ORAL_TABLET | Freq: Three times a day (TID) | ORAL | 3 refills | Status: AC | PRN

## 2024-03-03 MED ORDER — LOPERAMIDE HCL 2 MG PO CAPS: ORAL_CAPSULE | ORAL | 3 refills | Status: AC

## 2024-03-03 MED ORDER — POLYETHYLENE GLYCOL 3350 17 GM/SCOOP PO POWD: 17.0000 g | Freq: Three times a day (TID) | ORAL | 3 refills | Status: AC | PRN

## 2024-03-03 MED ORDER — SODIUM CHLORIDE 0.9 % IV SOLN
INTRAVENOUS | Status: DC
  Filled 2024-03-03: qty 250

## 2024-03-03 MED ORDER — SODIUM CHLORIDE 0.9 % IV SOLN
60.0000 mg/m2 | Freq: Once | INTRAVENOUS | Status: AC
  Administered 2024-03-03: 103 mg via INTRAVENOUS
  Filled 2024-03-03: qty 10.3

## 2024-03-03 MED ORDER — DEXAMETHASONE SODIUM PHOSPHATE 10 MG/ML IJ SOLN
10.0000 mg | Freq: Once | INTRAMUSCULAR | Status: AC
  Administered 2024-03-03: 10 mg via INTRAVENOUS
  Filled 2024-03-03: qty 1

## 2024-03-03 NOTE — Progress Notes (Signed)
 [facility] OFFICE PROGRESS NOTE  Patient Care Team: Bertrum Charlie CROME, MD as PCP - General (Family Medicine) Rennie Cindy SAUNDERS, MD as Consulting Physician (Oncology) [name], MD as Consulting Physician (Radiation Oncology)   Cancer Staging  No matching staging information was found for the patient.  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 s/p Radiation [finished 25th, SEP 2024]-Continue Er[place]a  #  MAY 2025- PET scan- Interval improvement in tracer avid tumor involving the sacrum and posterior right iliac bone; New/progressive foci of tracer avid bone metastasis are identified involving the left second rib, right iliac bone and right acetabulum;  New tracer avid retroperitoneal and right iliac chain nodal metastasis;  New focus of increased uptake within segment 4 of the liver without corresponding CT abnormality, equivocal for liver metastasis.   # SEP 25th, 2025- start Taxotere  [60 mg/m every 3 weeks]   # n Christmas Day 2022 he fell and was no found for a couple of days and was found to be dehydrated and hypothermic.  Patient was admitted to the ICU in [place] .  During hospitalization patient was found to have elevated PSA -  962 and mri showed extensive sclerosis in pelvic, sacral, spine, and scapula, prostate cancer metastatic to bone. Creatinine was 2.94 without normalization. He had severe sepsis and bacteruria.   At discharge patient was sent over to rehab.  Given the family in [place]/brother-patient moved to [place] .  Prior  to this episode patient was losing weight.  However patient had been very healthy prior to this episode.  He was living alone.  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     Prostate cancer metastatic to bone (HCC)  08/14/2021 Initial Diagnosis   Prostate cancer metastatic to bone [facility])   Malignant neoplasm of prostate (HCC)  06/13/2021 Initial Diagnosis   Malignant neoplasm of prostate (HCC)   03/03/2024 -  Chemotherapy   Patient is on Treatment Plan : PROSTATE Docetaxel  (75) + Prednisone q21d        HISTORY OF PRESENT ILLNESS: Patient in a rolling walker.   Patient lives in assisted living/[facility], accompanied by his brother.  Philip [name] 78 y.o. 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 He is anxious about starting something new but denies complaints. He struggles with constipation at baseline and is worried about having diarrhea and/or worsening constipation. Denies bone pain.   Review of Systems  Constitutional:  Positive for malaise/fatigue. Negative for chills, diaphoresis, fever and weight loss.  HENT:  Negative for nosebleeds and sore throat.   Eyes:  Negative for double vision.  Respiratory:  Negative for cough, hemoptysis, sputum production, shortness of breath and wheezing.   Cardiovascular:  Negative for chest pain, palpitations, orthopnea and leg swelling.  Gastrointestinal:  Negative for abdominal pain, blood in stool, constipation, diarrhea, heartburn, melena, nausea and vomiting.  Genitourinary:  Negative for dysuria, flank pain, frequency, hematuria  and urgency.  Musculoskeletal:  Negative for back pain, falls and joint pain.  Skin:  Negative for itching and rash.  Neurological:  Positive for weakness. Negative for dizziness, tingling, focal  weakness and headaches.  Endo/Heme/Allergies:  Does not bruise/bleed easily.  Psychiatric/Behavioral:  Negative for depression. The patient is not nervous/anxious and does not have insomnia.    PAST MEDICAL HISTORY :  Past Medical History:  Diagnosis Date   Acute kidney failure    Arthritis    Asthma    Cancer (HCC)    Dysphagia    Parkinson disease (HCC)    Prostate cancer (HCC)    Rhabdomyolysis    PAST SURGICAL HISTORY :   Past Surgical History:  Procedure Laterality Date   CARDIAC CATHETERIZATION     KNEE ARTHROSCOPY Right 2012   also lt knee unsure of date   TRANSURETHRAL RESECTION OF PROSTATE N/A 03/17/2022   Procedure: TRANSURETHRAL RESECTION OF THE PROSTATE (TURP);  Surgeon: Penne Knee, MD;  Location: [facility] ORS;  Service: Urology;  Laterality: N/A;   FAMILY HISTORY :   Family History  Problem Relation Age of Onset   Arthritis/Rheumatoid Mother    Stroke Father    Hemangiomas Brother    SOCIAL HISTORY:   Social History   Tobacco Use   Smoking status: Never   Smokeless tobacco: Never  Vaping Use   Vaping status: Never Used  Substance Use Topics   Alcohol use: Yes   Drug use: Never   ALLERGIES:  has no known allergies.  MEDICATIONS:  Current Outpatient Medications  Medication Sig Dispense Refill   acetaminophen  (TYLENOL ) 325 MG tablet Take 650 mg by mouth every 6 (six) hours as needed.     apalutamide  ([name] ) 60 MG tablet Take 4 tablets (240 mg total) by mouth daily. 120 tablet 2   calcium -vitamin D  (OSCAL WITH D) 500-5 MG-MCG tablet TAKE 1 TABLET BY MOUTH TWICE A DAY. 60 tablet 10   carbidopa -levodopa  (SINEMET  IR) 25-100 MG tablet Take 1 tablet by mouth 3 (three) times daily. (Patient taking differently: 3 (three) times daily. 2 with breakfast, 1 with lunch and 2 with dinner) 90 tablet 3   dexamethasone  (DECADRON ) 4 MG tablet Take 2 tabs by mouth 2 times daily starting day before chemo. Then take 2 tabs daily for 2 days starting day after chemo. Take  with food. 30 tablet 1   Leuprolide  Acetate (ELIGARD  [place]) Inject into the skin.     ondansetron  (ZOFRAN ) 8 MG tablet Take 1 tablet (8 mg total) by mouth every 8 (eight) hours as needed for nausea or vomiting. 30 tablet 1   prochlorperazine  (COMPAZINE ) 10 MG tablet Take 1 tablet (10 mg total) by mouth every 6 (six) hours as needed for nausea or vomiting. 30 tablet 1   No current facility-administered medications for this visit.   PHYSICAL EXAMINATION: ECOG PERFORMANCE STATUS: 2 - Symptomatic, <50% confined to bed  BP 126/66 (BP Location: Right Arm, Patient Position: Sitting, Cuff Size: Normal)   Pulse 79   Temp 98.2 F (36.8 C) (Tympanic)   Resp 16   Wt 134 lb (60.8 kg)   SpO2 98%   BMI 20.37 kg/m   Filed Weights   03/03/24 1022  Weight: 134 lb (60.8 kg)   Physical Exam Vitals and nursing note reviewed.  HENT:     Head: Normocephalic and atraumatic.     Mouth/Throat:     Pharynx: Oropharynx is clear.  Eyes:     Extraocular Movements: Extraocular movements  intact.     Pupils: Pupils are equal, round, and reactive to light.  Cardiovascular:     Rate and Rhythm: Normal rate and regular rhythm.  Pulmonary:     Comments: Decreased breath sounds bilaterally.  Abdominal:     Palpations: Abdomen is soft.  Musculoskeletal:        General: Normal range of motion.     Cervical back: Normal range of motion.  Skin:    General: Skin is warm.  Neurological:     General: No focal deficit present.     Mental Status: He is alert and oriented to person, place, and time.  Psychiatric:        Behavior: Behavior normal.        Judgment: Judgment normal.    LABORATORY DATA:  I have reviewed the data as listed    Component Value Date/Time   NA 136 03/03/2024 1006   NA 140 07/31/2021 1612   K 3.9 03/03/2024 1006   CL 101 03/03/2024 1006   CO2 26 03/03/2024 1006   GLUCOSE 109 (H) 03/03/2024 1006   BUN 20 03/03/2024 1006   BUN 25 07/31/2021 1612   CREATININE 1.20 03/03/2024 1006    CALCIUM  9.5 03/03/2024 1006   PROT 6.7 03/03/2024 1006   PROT 6.5 07/31/2021 1612   ALBUMIN 4.1 03/03/2024 1006   ALBUMIN 4.3 07/31/2021 1612   AST 24 03/03/2024 1006   ALT 6 03/03/2024 1006   ALKPHOS 82 03/03/2024 1006   BILITOT 0.7 03/03/2024 1006   GFRNONAA >60 03/03/2024 1006   Lab Results  Component Value Date   WBC 8.3 03/03/2024   NEUTROABS 5.6 03/03/2024   HGB 12.9 (L) 03/03/2024   HCT 37.7 (L) 03/03/2024   MCV 89.3 03/03/2024   PLT 270 03/03/2024     Chemistry      Component Value Date/Time   NA 136 03/03/2024 1006   NA 140 07/31/2021 1612   K 3.9 03/03/2024 1006   CL 101 03/03/2024 1006   CO2 26 03/03/2024 1006   BUN 20 03/03/2024 1006   BUN 25 07/31/2021 1612   CREATININE 1.20 03/03/2024 1006      Component Value Date/Time   CALCIUM  9.5 03/03/2024 1006   ALKPHOS 82 03/03/2024 1006   AST 24 03/03/2024 1006   ALT 6 03/03/2024 1006   BILITOT 0.7 03/03/2024 1006      Latest Reference Range & Units 10/23/21 10:12 12/04/21 10:29 02/05/22 14:38 03/31/22 11:28 04/24/22 13:19 05/19/22 08:57 06/20/22 09:57 08/21/22 08:29 10/21/22 13:06 12/25/22 10:36 02/02/23 09:54 03/05/23 09:30 04/09/23 09:37 05/11/23 09:52 06/22/23 14:11 08/20/23 09:48  Prostatic Specific Antigen 0.00 - 4.00 ng/mL 16.55 (H) 8.25 (H) 15.66 (H) 35.24 (H) 65.39 (H) 12.85 (H) 3.90 1.69 3.07 15.67 (H) 31.46 (H) 62.19 (H) 20.94 (H) 11.94 (H) 9.45 (H) 18.59 (H)  (H): Data is abnormally high  RADIOGRAPHIC STUDIES: I have personally reviewed the radiological images as listed and agreed with the findings in the report. No results found.   ASSESSMENT & PLAN:   ASSESSMENT & PLAN:  Prostate cancer metastatic to bone (HCC) # -De novo metastatic-  prostate cancer [Dx- DEC 2022;[place]] - CURRENTLY CASTRATE RESISTANT-currently on ADT+Apalutamide    # RISING PSA-  MAY 2025- PET scan- Interval improvement in tracer avid tumor involving the sacrum and posterior right iliac bone; New/progressive foci of tracer avid  bone metastasis are identified involving the left second rib, right iliac bone and right acetabulum;  New tracer avid retroperitoneal and right iliac chain  nodal metastasis;  New focus of increased uptake within segment 4 of the liver without corresponding CT abnormality, equivocal for liver metastasis.     # Given castrate resistant prostate cancer- recommend Taxotere  chemotherapy-every 3 weeks x 6 cycles with GCSF on D2 for prevention of febrile neutropenias. Plan is to initiate docetaxel  at reduced dose of 60 mg/m2 d/t frailty. We again reviewed use of dexamethasone  as premedication. I also reviewed use of antiemetics including zofran  and compazine . Prescriptions previously sent. I also will send prescriptions for bowel prophylaxis including miralax  and senna to his pharmacy. Reviewed risk of diarrhea which chemo and will send imodium  to have on hand.   # Labs today reviewed and acceptable for treatment. Proceed with cycle 1 of docetaxel  today.   # Urinary obstruction -s/p TURP [OCT 2023; Dr.Brandon]- stable.    # Dysphagia- [> 30 years-] ?  Esophageal spasms status post barium swallow. stable Continue follow-up with GI.  If issues with swallowing pills-recommend disintegrating Zofran .    #Parkinson's-[Dr.Potter]-  on sinemet .stable   # ACP: s/p GOC- DNR. Treatment given with palliative intent.    # Vaccinations- OK with flu shot/Pneumonia vaccinations    # IV access: PIV    Eigard 45mg - JULY 18th 2025;  [facility] *transport-    # Follow up  in 1 week-APP-labs- chemo- d-2 injection x3 cycles   # DISPOSITION: Follow up as scheduled per IS- la   No problem-specific Assessment & Plan notes found for this encounter.  No orders of the defined types were placed in this encounter.  All questions were answered. The patient knows to call the clinic with any problems, questions or concerns.    Tinnie KANDICE Dawn, NP 03/03/2024

## 2024-03-04 ENCOUNTER — Inpatient Hospital Stay

## 2024-03-04 DIAGNOSIS — Z5111 Encounter for antineoplastic chemotherapy: Secondary | ICD-10-CM | POA: Diagnosis not present

## 2024-03-04 DIAGNOSIS — C61 Malignant neoplasm of prostate: Secondary | ICD-10-CM

## 2024-03-04 MED ORDER — PEGFILGRASTIM-FPGK 6 MG/0.6ML ~~LOC~~ SOSY
6.0000 mg | PREFILLED_SYRINGE | Freq: Once | SUBCUTANEOUS | Status: AC
  Administered 2024-03-04: 6 mg via SUBCUTANEOUS
  Filled 2024-03-04: qty 0.6

## 2024-03-07 ENCOUNTER — Encounter: Payer: Self-pay | Admitting: Internal Medicine

## 2024-03-07 ENCOUNTER — Telehealth: Payer: Self-pay

## 2024-03-07 NOTE — Telephone Encounter (Signed)
 Telephone call to patient brother for follow up after receiving first infusion.   Patient brother states infusion went great.  States eating good and drinking plenty of fluids but having a lot of fatigue.   Denies any nausea or vomiting.  Encouraged patient to call for any concerns or questions.

## 2024-03-22 ENCOUNTER — Encounter: Payer: Self-pay | Admitting: Internal Medicine

## 2024-03-23 ENCOUNTER — Other Ambulatory Visit: Payer: Self-pay | Admitting: Internal Medicine

## 2024-03-23 DIAGNOSIS — C61 Malignant neoplasm of prostate: Secondary | ICD-10-CM

## 2024-03-24 ENCOUNTER — Encounter: Payer: Self-pay | Admitting: Internal Medicine

## 2024-03-24 ENCOUNTER — Inpatient Hospital Stay

## 2024-03-24 ENCOUNTER — Inpatient Hospital Stay (HOSPITAL_BASED_OUTPATIENT_CLINIC_OR_DEPARTMENT_OTHER): Admitting: Nurse Practitioner

## 2024-03-24 ENCOUNTER — Encounter: Payer: Self-pay | Admitting: Nurse Practitioner

## 2024-03-24 ENCOUNTER — Inpatient Hospital Stay: Attending: Internal Medicine

## 2024-03-24 VITALS — BP 121/60 | HR 75 | Temp 96.8°F | Resp 16 | Ht 68.0 in | Wt 133.1 lb

## 2024-03-24 VITALS — BP 121/62 | HR 70

## 2024-03-24 DIAGNOSIS — R1310 Dysphagia, unspecified: Secondary | ICD-10-CM | POA: Insufficient documentation

## 2024-03-24 DIAGNOSIS — C61 Malignant neoplasm of prostate: Secondary | ICD-10-CM

## 2024-03-24 DIAGNOSIS — G20A1 Parkinson's disease without dyskinesia, without mention of fluctuations: Secondary | ICD-10-CM | POA: Insufficient documentation

## 2024-03-24 DIAGNOSIS — M1990 Unspecified osteoarthritis, unspecified site: Secondary | ICD-10-CM | POA: Diagnosis not present

## 2024-03-24 DIAGNOSIS — M6282 Rhabdomyolysis: Secondary | ICD-10-CM | POA: Insufficient documentation

## 2024-03-24 DIAGNOSIS — Z5111 Encounter for antineoplastic chemotherapy: Secondary | ICD-10-CM | POA: Insufficient documentation

## 2024-03-24 DIAGNOSIS — N139 Obstructive and reflux uropathy, unspecified: Secondary | ICD-10-CM | POA: Diagnosis not present

## 2024-03-24 DIAGNOSIS — Z9079 Acquired absence of other genital organ(s): Secondary | ICD-10-CM | POA: Insufficient documentation

## 2024-03-24 DIAGNOSIS — Z7952 Long term (current) use of systemic steroids: Secondary | ICD-10-CM | POA: Diagnosis not present

## 2024-03-24 DIAGNOSIS — Z5189 Encounter for other specified aftercare: Secondary | ICD-10-CM | POA: Insufficient documentation

## 2024-03-24 DIAGNOSIS — K5900 Constipation, unspecified: Secondary | ICD-10-CM | POA: Insufficient documentation

## 2024-03-24 DIAGNOSIS — C7951 Secondary malignant neoplasm of bone: Secondary | ICD-10-CM | POA: Diagnosis not present

## 2024-03-24 DIAGNOSIS — Z192 Hormone resistant malignancy status: Secondary | ICD-10-CM | POA: Insufficient documentation

## 2024-03-24 LAB — CBC WITH DIFFERENTIAL (CANCER CENTER ONLY)
Abs Immature Granulocytes: 0.03 K/uL (ref 0.00–0.07)
Basophils Absolute: 0.1 K/uL (ref 0.0–0.1)
Basophils Relative: 2 %
Eosinophils Absolute: 0 K/uL (ref 0.0–0.5)
Eosinophils Relative: 0 %
HCT: 34 % — ABNORMAL LOW (ref 39.0–52.0)
Hemoglobin: 12.1 g/dL — ABNORMAL LOW (ref 13.0–17.0)
Immature Granulocytes: 0 %
Lymphocytes Relative: 21 %
Lymphs Abs: 1.6 K/uL (ref 0.7–4.0)
MCH: 31.5 pg (ref 26.0–34.0)
MCHC: 35.6 g/dL (ref 30.0–36.0)
MCV: 88.5 fL (ref 80.0–100.0)
Monocytes Absolute: 1.5 K/uL — ABNORMAL HIGH (ref 0.1–1.0)
Monocytes Relative: 20 %
Neutro Abs: 4.2 K/uL (ref 1.7–7.7)
Neutrophils Relative %: 57 %
Platelet Count: 381 K/uL (ref 150–400)
RBC: 3.84 MIL/uL — ABNORMAL LOW (ref 4.22–5.81)
RDW: 13.4 % (ref 11.5–15.5)
WBC Count: 7.4 K/uL (ref 4.0–10.5)
nRBC: 0 % (ref 0.0–0.2)

## 2024-03-24 LAB — CMP (CANCER CENTER ONLY)
ALT: <5 U/L (ref 0–44)
AST: 19 U/L (ref 15–41)
Albumin: 3.8 g/dL (ref 3.5–5.0)
Alkaline Phosphatase: 84 U/L (ref 38–126)
Anion gap: 9 (ref 5–15)
BUN: 21 mg/dL (ref 8–23)
CO2: 26 mmol/L (ref 22–32)
Calcium: 9.4 mg/dL (ref 8.9–10.3)
Chloride: 100 mmol/L (ref 98–111)
Creatinine: 1.14 mg/dL (ref 0.61–1.24)
GFR, Estimated: >60 mL/min (ref >60)
Glucose, Bld: 110 mg/dL — ABNORMAL HIGH (ref 70–99)
Potassium: 3.8 mmol/L (ref 3.5–5.1)
Sodium: 135 mmol/L (ref 135–145)
Total Bilirubin: 0.6 mg/dL (ref 0.0–1.2)
Total Protein: 6.4 g/dL — ABNORMAL LOW (ref 6.5–8.1)

## 2024-03-24 LAB — PSA: Prostatic Specific Antigen: 90.65 ng/mL — ABNORMAL HIGH (ref 0.00–4.00)

## 2024-03-24 MED ORDER — SODIUM CHLORIDE 0.9 % IV SOLN
INTRAVENOUS | Status: DC
  Filled 2024-03-24 (×2): qty 250

## 2024-03-24 MED ORDER — SODIUM CHLORIDE 0.9 % IV SOLN
60.0000 mg/m2 | Freq: Once | INTRAVENOUS | Status: AC
  Administered 2024-03-24: 103 mg via INTRAVENOUS
  Filled 2024-03-24: qty 10.3

## 2024-03-24 MED ORDER — DEXAMETHASONE SOD PHOSPHATE PF 10 MG/ML IJ SOLN
10.0000 mg | Freq: Once | INTRAMUSCULAR | Status: AC
  Administered 2024-03-24: 10 mg via INTRAVENOUS

## 2024-03-24 NOTE — Progress Notes (Signed)
 No concerns today

## 2024-03-24 NOTE — Progress Notes (Signed)
 [facility] OFFICE PROGRESS NOTE  Patient Care Team: Bertrum Charlie CROME, MD as PCP - General (Family Medicine) Rennie Cindy SAUNDERS, MD as Consulting Physician (Oncology) [name], MD as Consulting Physician (Radiation Oncology)   Cancer Staging  No matching staging information was found for the patient.  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 s/p Radiation [finished 25th, SEP 2024]-Continue Erleda  #  MAY 2025- PET scan- Interval improvement in tracer avid tumor involving the sacrum and posterior right iliac bone; New/progressive foci of tracer avid bone metastasis are identified involving the left second rib, right iliac bone and right acetabulum;  New tracer avid retroperitoneal and right iliac chain nodal metastasis;  New focus of increased uptake within segment 4 of the liver without corresponding CT abnormality, equivocal for liver metastasis.   # SEP 25th, 2025- start Taxotere  [60 mg/m every 3 weeks]   # n Christmas Day 2022 he fell and was no found for a couple of days and was found to be dehydrated and hypothermic.  Patient was admitted to the ICU in [place] .  During hospitalization patient was found to have elevated PSA -  962 and mri showed extensive sclerosis in pelvic, sacral, spine, and scapula, prostate cancer metastatic to bone. Creatinine was 2.94 without normalization. He had severe sepsis and bacteruria.   At discharge patient was sent over to rehab.  Given the family in [place]/brother-patient moved to [place] .  Prior  to this episode patient was losing weight.  However patient had been very healthy prior to this episode.  He was living alone.  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  Esophagus spasms.     Prostate cancer metastatic to bone (HCC)  08/14/2021 Initial Diagnosis   Prostate cancer metastatic to bone [facility])   Malignant neoplasm of prostate (HCC)  06/13/2021 Initial Diagnosis   Malignant neoplasm of prostate (HCC)   03/03/2024 -  Chemotherapy   Patient is on Treatment Plan : PROSTATE Docetaxel  (75) + Prednisone q21d        HISTORY OF PRESENT ILLNESS: Patient walked into clinic.  Patient lives in assisted living/[facility], accompanied by his brother.  Philip [name] 78 y.o. 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  He was nervous about starting chemo but says that he tolerated it well.  Had some constipation but did not use any stool softeners and that resolved.  Denies any new pains.  Denies numbness or tingling.  Denies nausea or vomiting.  Has noticed some hair thinning.  No fevers or chills.  No other complaints.  Review of Systems  Constitutional:  Positive for malaise/fatigue. Negative for chills, diaphoresis, fever and weight loss.  HENT:  Negative for nosebleeds and sore throat.   Eyes:  Negative for double vision.  Respiratory:  Negative for cough, hemoptysis, sputum production, shortness of breath and wheezing.   Cardiovascular:  Negative for chest pain, palpitations, orthopnea and  leg swelling.  Gastrointestinal:  Negative for abdominal pain, blood in stool, constipation, diarrhea, heartburn, melena, nausea and vomiting.  Genitourinary:  Negative for dysuria, flank pain, frequency, hematuria and urgency.  Musculoskeletal:  Negative for back pain, falls and joint pain.  Skin:   Negative for itching and rash.  Neurological:  Positive for weakness. Negative for dizziness, tingling, focal weakness and headaches.  Endo/Heme/Allergies:  Does not bruise/bleed easily.  Psychiatric/Behavioral:  Negative for depression. The patient is not nervous/anxious and does not have insomnia.    PAST MEDICAL HISTORY :  Past Medical History:  Diagnosis Date   Acute kidney failure    Arthritis    Asthma    Cancer (HCC)    Dysphagia    Parkinson disease (HCC)    Prostate cancer (HCC)    Rhabdomyolysis    PAST SURGICAL HISTORY :   Past Surgical History:  Procedure Laterality Date   CARDIAC CATHETERIZATION     KNEE ARTHROSCOPY Right 2012   also lt knee unsure of date   TRANSURETHRAL RESECTION OF PROSTATE N/A 03/17/2022   Procedure: TRANSURETHRAL RESECTION OF THE PROSTATE (TURP);  Surgeon: Penne Knee, MD;  Location: [facility] ORS;  Service: Urology;  Laterality: N/A;   FAMILY HISTORY :   Family History  Problem Relation Age of Onset   Arthritis/Rheumatoid Mother    Stroke Father    Hemangiomas Brother    SOCIAL HISTORY:   Social History   Tobacco Use   Smoking status: Never   Smokeless tobacco: Never  Vaping Use   Vaping status: Never Used  Substance Use Topics   Alcohol use: Yes   Drug use: Never   ALLERGIES:  has no known allergies.  MEDICATIONS:  Current Outpatient Medications  Medication Sig Dispense Refill   acetaminophen  (TYLENOL ) 325 MG tablet Take 650 mg by mouth every 6 (six) hours as needed.     calcium -vitamin D  (OSCAL WITH D) 500-5 MG-MCG tablet TAKE 1 TABLET BY MOUTH TWICE A DAY. 60 tablet 10   carbidopa -levodopa  (SINEMET  IR) 25-100 MG tablet Take 1 tablet by mouth 3 (three) times daily. (Patient taking differently: 3 (three) times daily. 2 with breakfast, 1 with lunch and 2 with dinner) 90 tablet 3   dexamethasone  (DECADRON ) 4 MG tablet Take 2 tabs by mouth 2 times daily starting day before chemo. Then take 2 tabs daily for 2 days starting day after  chemo. Take with food. 30 tablet 1   [name]  60 MG tablet TAKE 4 TABLETS BY MOUTH EVERY DAY 120 tablet 2   Leuprolide  Acetate (ELIGARD  Brillion) Inject into the skin.     loperamide  (IMODIUM ) 2 MG capsule Take 1 tablet (2 mg) by mouth at first loose/watery stool then additional tablet with each subsequent loose stools. Do not exceed 8 tablets in 24 hour period. 120 capsule 3   ondansetron  (ZOFRAN ) 8 MG tablet Take 1 tablet (8 mg total) by mouth every 8 (eight) hours as needed for nausea or vomiting. 30 tablet 1   polyethylene glycol powder (MIRALAX ) 17 GM/SCOOP powder Take 17 g by mouth every 8 (eight) hours as needed for mild constipation or moderate constipation. Dissolve in 6-8 ounces of liquid. 238 g 3   prochlorperazine  (COMPAZINE ) 10 MG tablet Take 1 tablet (10 mg total) by mouth every 6 (six) hours as needed for nausea or vomiting. 30 tablet 1   senna (SENOKOT) 8.6 MG TABS tablet Take 1-2 tablets (8.6-17.2 mg total) by mouth 3 (three) times daily as needed for mild constipation.  Ok to take with miralax . 120 tablet 3   No current facility-administered medications for this visit.   PHYSICAL EXAMINATION: ECOG PERFORMANCE STATUS: 2 - Symptomatic, <50% confined to bed  BP 121/60 (BP Location: Left Arm, Patient Position: Sitting, Cuff Size: Normal)   Pulse 75   Temp (!) 96.8 F (36 C) (Tympanic)   Resp 16   Ht 5' 8 (1.727 m)   Wt 133 lb 1.6 oz (60.4 kg)   SpO2 99%   BMI 20.24 kg/m   Filed Weights   03/24/24 0959  Weight: 133 lb 1.6 oz (60.4 kg)   Physical Exam Vitals and nursing note reviewed.  HENT:     Head: Normocephalic and atraumatic.     Mouth/Throat:     Pharynx: Oropharynx is clear.  Eyes:     Extraocular Movements: Extraocular movements intact.     Pupils: Pupils are equal, round, and reactive to light.  Cardiovascular:     Rate and Rhythm: Normal rate and regular rhythm.  Pulmonary:     Comments: Decreased breath sounds bilaterally.  Abdominal:     Palpations: Abdomen  is soft.  Musculoskeletal:        General: Normal range of motion.     Cervical back: Normal range of motion.  Skin:    General: Skin is warm.  Neurological:     General: No focal deficit present.     Mental Status: He is alert and oriented to person, place, and time.  Psychiatric:        Behavior: Behavior normal.        Judgment: Judgment normal.    LABORATORY DATA:  I have reviewed the data as listed    Component Value Date/Time   NA 135 03/24/2024 1010   NA 140 07/31/2021 1612   K 3.8 03/24/2024 1010   CL 100 03/24/2024 1010   CO2 26 03/24/2024 1010   GLUCOSE 110 (H) 03/24/2024 1010   BUN 21 03/24/2024 1010   BUN 25 07/31/2021 1612   CREATININE 1.14 03/24/2024 1010   CALCIUM  9.4 03/24/2024 1010   PROT 6.4 (L) 03/24/2024 1010   PROT 6.5 07/31/2021 1612   ALBUMIN 3.8 03/24/2024 1010   ALBUMIN 4.3 07/31/2021 1612   AST 19 03/24/2024 1010   ALT <5 03/24/2024 1010   ALKPHOS 84 03/24/2024 1010   BILITOT 0.6 03/24/2024 1010   GFRNONAA >60 03/24/2024 1010   Lab Results  Component Value Date   WBC 7.4 03/24/2024   NEUTROABS 4.2 03/24/2024   HGB 12.1 (L) 03/24/2024   HCT 34.0 (L) 03/24/2024   MCV 88.5 03/24/2024   PLT 381 03/24/2024     Chemistry      Component Value Date/Time   NA 135 03/24/2024 1010   NA 140 07/31/2021 1612   K 3.8 03/24/2024 1010   CL 100 03/24/2024 1010   CO2 26 03/24/2024 1010   BUN 21 03/24/2024 1010   BUN 25 07/31/2021 1612   CREATININE 1.14 03/24/2024 1010      Component Value Date/Time   CALCIUM  9.4 03/24/2024 1010   ALKPHOS 84 03/24/2024 1010   AST 19 03/24/2024 1010   ALT <5 03/24/2024 1010   BILITOT 0.6 03/24/2024 1010      Latest Reference Range & Units 10/23/21 10:12 12/04/21 10:29 02/05/22 14:38 03/31/22 11:28 04/24/22 13:19 05/19/22 08:57 06/20/22 09:57 08/21/22 08:29 10/21/22 13:06 12/25/22 10:36 02/02/23 09:54 03/05/23 09:30 04/09/23 09:37 05/11/23 09:52 06/22/23 14:11 08/20/23 09:48  Prostatic Specific Antigen 0.00 -  4.00  ng/mL 16.55 (H) 8.25 (H) 15.66 (H) 35.24 (H) 65.39 (H) 12.85 (H) 3.90 1.69 3.07 15.67 (H) 31.46 (H) 62.19 (H) 20.94 (H) 11.94 (H) 9.45 (H) 18.59 (H)  (H): Data is abnormally high  RADIOGRAPHIC STUDIES: I have personally reviewed the radiological images as listed and agreed with the findings in the report. No results found.   ASSESSMENT & PLAN:   ASSESSMENT & PLAN:  Prostate cancer metastatic to bone (HCC) # -De novo metastatic-  prostate cancer [Dx- DEC 2022;[place]] - CURRENTLY CASTRATE RESISTANT-currently on ADT+Apalutamide    # RISING PSA-  MAY 2025- PET scan- Interval improvement in tracer avid tumor involving the sacrum and posterior right iliac bone; New/progressive foci of tracer avid bone metastasis are identified involving the left second rib, right iliac bone and right acetabulum;  New tracer avid retroperitoneal and right iliac chain nodal metastasis;  New focus of increased uptake within segment 4 of the liver without corresponding CT abnormality, equivocal for liver metastasis.     # Given castrate resistant prostate cancer- recommended Taxotere  chemotherapy-every 3 weeks x 6 cycles with GCSF on D2 for prevention of febrile neutropenias. Plan is to initiate docetaxel  at reduced dose of 60 mg/m2 d/t frailty.  Again reviewed dexamethasone  as a premedication.  Reviewed the use of antiemetics if needed including Zofran  or Compazine .  I also had previously sent prescriptions for MiraLAX  and senna for bowel prophylaxis if needed and Imodium  for diarrhea if needed.  # Labs today were reviewed and acceptable for treatment including CBC, CMP.  Proceed with cycle 2 of docetaxel  today.  Tolerated cycle 1 well without significant side effects.  # Urinary obstruction -s/p TURP [OCT 2023; Dr.Brandon]- stable.    # Dysphagia- [> 30 years-] ?  Esophageal spasms status post barium swallow. stable Continue follow-up with GI.  If issues with swallowing pills-recommend disintegrating Zofran .     #Parkinson's-[Dr.Potter]-  on sinemet .stable  # Constipation-reviewed the use of MiraLAX  and senna to achieve 1 soft bowel movement every day every other day.   # ACP: s/p GOC- DNR. Treatment given with palliative intent.    # Vaccinations- OK with flu shot/Pneumonia vaccinations    # IV access: PIV    Eigard 45mg - JULY 18th 2025;  [facility] *transport-    # Follow up  in 1 week-APP-labs- chemo- d-2 injection x3 cycles   # DISPOSITION: Proceed with docetaxel  chemotherapy today.  He will return to clinic tomorrow for pegfilgrastim . 3 weeks- lab, Dr Rennie, +/- c3 docetaxel . D2 stimufend - la  No problem-specific Assessment & Plan notes found for this encounter.  No orders of the defined types were placed in this encounter.  All questions were answered. The patient knows to call the clinic with any problems, questions or concerns.    Tinnie KANDICE Dawn, NP 03/24/2024

## 2024-03-24 NOTE — Patient Instructions (Signed)

## 2024-03-25 ENCOUNTER — Inpatient Hospital Stay

## 2024-03-25 DIAGNOSIS — C61 Malignant neoplasm of prostate: Secondary | ICD-10-CM

## 2024-03-25 MED ORDER — PEGFILGRASTIM-FPGK 6 MG/0.6ML ~~LOC~~ SOSY
6.0000 mg | PREFILLED_SYRINGE | Freq: Once | SUBCUTANEOUS | Status: AC
  Administered 2024-03-25: 6 mg via SUBCUTANEOUS
  Filled 2024-03-25: qty 0.6

## 2024-04-08 ENCOUNTER — Encounter: Payer: Self-pay | Admitting: Internal Medicine

## 2024-04-14 ENCOUNTER — Inpatient Hospital Stay

## 2024-04-14 ENCOUNTER — Encounter: Payer: Self-pay | Admitting: Internal Medicine

## 2024-04-14 ENCOUNTER — Inpatient Hospital Stay: Attending: Internal Medicine

## 2024-04-14 ENCOUNTER — Telehealth: Payer: Self-pay

## 2024-04-14 ENCOUNTER — Inpatient Hospital Stay: Admitting: Internal Medicine

## 2024-04-14 VITALS — BP 133/71 | HR 66 | Temp 97.7°F | Resp 12

## 2024-04-14 VITALS — BP 126/66 | HR 71 | Temp 95.9°F | Resp 16 | Ht 68.0 in | Wt 129.8 lb

## 2024-04-14 DIAGNOSIS — C61 Malignant neoplasm of prostate: Secondary | ICD-10-CM | POA: Insufficient documentation

## 2024-04-14 DIAGNOSIS — Z192 Hormone resistant malignancy status: Secondary | ICD-10-CM | POA: Insufficient documentation

## 2024-04-14 DIAGNOSIS — Z7952 Long term (current) use of systemic steroids: Secondary | ICD-10-CM | POA: Insufficient documentation

## 2024-04-14 DIAGNOSIS — R5383 Other fatigue: Secondary | ICD-10-CM | POA: Diagnosis not present

## 2024-04-14 DIAGNOSIS — G20A1 Parkinson's disease without dyskinesia, without mention of fluctuations: Secondary | ICD-10-CM | POA: Diagnosis not present

## 2024-04-14 DIAGNOSIS — E860 Dehydration: Secondary | ICD-10-CM | POA: Insufficient documentation

## 2024-04-14 DIAGNOSIS — R1310 Dysphagia, unspecified: Secondary | ICD-10-CM | POA: Insufficient documentation

## 2024-04-14 DIAGNOSIS — J45909 Unspecified asthma, uncomplicated: Secondary | ICD-10-CM | POA: Diagnosis not present

## 2024-04-14 DIAGNOSIS — M1990 Unspecified osteoarthritis, unspecified site: Secondary | ICD-10-CM | POA: Diagnosis not present

## 2024-04-14 DIAGNOSIS — C7951 Secondary malignant neoplasm of bone: Secondary | ICD-10-CM | POA: Insufficient documentation

## 2024-04-14 DIAGNOSIS — Z5111 Encounter for antineoplastic chemotherapy: Secondary | ICD-10-CM | POA: Diagnosis present

## 2024-04-14 DIAGNOSIS — N139 Obstructive and reflux uropathy, unspecified: Secondary | ICD-10-CM | POA: Insufficient documentation

## 2024-04-14 DIAGNOSIS — Z923 Personal history of irradiation: Secondary | ICD-10-CM | POA: Insufficient documentation

## 2024-04-14 DIAGNOSIS — Z9079 Acquired absence of other genital organ(s): Secondary | ICD-10-CM | POA: Diagnosis not present

## 2024-04-14 LAB — CMP (CANCER CENTER ONLY)
ALT: 9 U/L (ref 0–44)
AST: 14 U/L — ABNORMAL LOW (ref 15–41)
Albumin: 4 g/dL (ref 3.5–5.0)
Alkaline Phosphatase: 92 U/L (ref 38–126)
Anion gap: 8 (ref 5–15)
BUN: 23 mg/dL (ref 8–23)
CO2: 27 mmol/L (ref 22–32)
Calcium: 9.3 mg/dL (ref 8.9–10.3)
Chloride: 102 mmol/L (ref 98–111)
Creatinine: 1.02 mg/dL (ref 0.61–1.24)
GFR, Estimated: >60 mL/min (ref >60)
Glucose, Bld: 100 mg/dL — ABNORMAL HIGH (ref 70–99)
Potassium: 3.9 mmol/L (ref 3.5–5.1)
Sodium: 137 mmol/L (ref 135–145)
Total Bilirubin: 0.8 mg/dL (ref 0.0–1.2)
Total Protein: 6.9 g/dL (ref 6.5–8.1)

## 2024-04-14 LAB — CBC WITH DIFFERENTIAL (CANCER CENTER ONLY)
Abs Immature Granulocytes: 0.04 K/uL (ref 0.00–0.07)
Basophils Absolute: 0.1 K/uL (ref 0.0–0.1)
Basophils Relative: 1 %
Eosinophils Absolute: 0 K/uL (ref 0.0–0.5)
Eosinophils Relative: 0 %
HCT: 33.7 % — ABNORMAL LOW (ref 39.0–52.0)
Hemoglobin: 11.4 g/dL — ABNORMAL LOW (ref 13.0–17.0)
Immature Granulocytes: 1 %
Lymphocytes Relative: 12 %
Lymphs Abs: 0.9 K/uL (ref 0.7–4.0)
MCH: 30.4 pg (ref 26.0–34.0)
MCHC: 33.8 g/dL (ref 30.0–36.0)
MCV: 89.9 fL (ref 80.0–100.0)
Monocytes Absolute: 1 K/uL (ref 0.1–1.0)
Monocytes Relative: 14 %
Neutro Abs: 5.2 K/uL (ref 1.7–7.7)
Neutrophils Relative %: 72 %
Platelet Count: 317 K/uL (ref 150–400)
RBC: 3.75 MIL/uL — ABNORMAL LOW (ref 4.22–5.81)
RDW: 14.2 % (ref 11.5–15.5)
WBC Count: 7.2 K/uL (ref 4.0–10.5)
nRBC: 0 % (ref 0.0–0.2)

## 2024-04-14 LAB — PSA: Prostatic Specific Antigen: 97.31 ng/mL — ABNORMAL HIGH (ref 0.00–4.00)

## 2024-04-14 MED ORDER — SODIUM CHLORIDE 0.9 % IV SOLN
60.0000 mg/m2 | Freq: Once | INTRAVENOUS | Status: AC
  Administered 2024-04-14: 103 mg via INTRAVENOUS
  Filled 2024-04-14: qty 10.3

## 2024-04-14 MED ORDER — DEXAMETHASONE SOD PHOSPHATE PF 10 MG/ML IJ SOLN
10.0000 mg | Freq: Once | INTRAMUSCULAR | Status: AC
  Administered 2024-04-14: 10 mg via INTRAVENOUS

## 2024-04-14 MED ORDER — SODIUM CHLORIDE 0.9 % IV SOLN
INTRAVENOUS | Status: DC
  Filled 2024-04-14: qty 250

## 2024-04-14 NOTE — Telephone Encounter (Signed)
 Will send dental clearance to patient's dentist Juliene Shelter, DDS at [facility] P: [phone] F: [phone]

## 2024-04-14 NOTE — Progress Notes (Signed)
 Per Dr Rennie.  Hold zometa today due to lack of dental clearance.  Pt informed.

## 2024-04-14 NOTE — Progress Notes (Signed)
 C/o fatigue. Appetite worse since chemo. Losing weight. Brother states needsPatty, pharm to get him qualified again for the erleda.

## 2024-04-14 NOTE — Telephone Encounter (Addendum)
 Patient's brother, Lynwood, is calling with concerns of patient not receiving full treatment today.  After review of Dr. Rona note from today it seems that patient did not receive Zometa today due to unknown dental clearance and MD will check at next visit.  Returned Lynwood call but no answer so a message was left asking him to call the office.  Dr. KATHEE, do you want me to find out dentist information and send dental clearance request?

## 2024-04-14 NOTE — Progress Notes (Signed)
 [facility] OFFICE PROGRESS NOTE  Patient Care Team: Bertrum Charlie CROME, MD as PCP - General (Family Medicine) Rennie Cindy SAUNDERS, MD as Consulting Physician (Oncology) [name], MD as Consulting Physician (Radiation Oncology)   Cancer Staging  No matching staging information was found for the patient.    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 s/p Radiation [finished 25th, SEP 2024]-Continue Erleda  #  MAY 2025- PET scan- Interval improvement in tracer avid tumor involving the sacrum and posterior right iliac bone; New/progressive foci of tracer avid bone metastasis are identified involving the left second rib, right iliac bone and right acetabulum;  New tracer avid retroperitoneal and right iliac chain nodal metastasis;  New focus of increased uptake within segment 4 of the liver without corresponding CT abnormality, equivocal for liver metastasis.   # SEP 25th, 2025- start Taxotere  [60 mg/m every 3 weeks]   # n Christmas Day 2022 he fell and was no found for a couple of days and was found to be dehydrated and hypothermic.  Patient was admitted to the ICU in [place] .  During hospitalization patient was found to have elevated PSA -  962 and mri showed extensive sclerosis in pelvic, sacral, spine, and scapula, prostate cancer metastatic to bone. Creatinine was 2.94 without normalization. He had severe sepsis and bacteruria.   At discharge patient was sent over to rehab.  Given the family in [place]/brother-patient moved to [place].  Prior to this episode patient was losing weight.  However patient had been very healthy prior to this episode.  He was living alone.  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  Esophagus spasms.     Prostate cancer metastatic to bone (HCC)  08/14/2021 Initial Diagnosis   Prostate cancer metastatic to bone [facility])   Malignant neoplasm of prostate (HCC)  06/13/2021 Initial Diagnosis   Malignant neoplasm of prostate (HCC)   03/03/2024 -  Chemotherapy   Patient is on Treatment Plan : PROSTATE Docetaxel  (75) + Prednisone q21d        HISTORY OF PRESENT ILLNESS: Patient in a rolling walker.   Patient lives in assisted living/[facility]; accompanied by his brother.  Discussed the use of AI scribe software for clinical note transcription with the patient, who gave verbal consent to proceed.  History of Present Illness   Philip [name] is a 78 year old male with metastatic castration-resistant prostate cancer who presents for follow-up. He is accompanied by a family member.  He is currently undergoing chemotherapy with Taxotere  and has completed two cycles. He experiences fatigue, with energy levels at about seventy percent of baseline, dropping to fifty percent at his worst between cycles. No significant side effects such as diarrhea, swelling of the legs, neuropathy, tingling, or numbness are reported, but he notes rapid hair thinning.  His weight has fluctuated, with a decrease from 134 pounds to 129 pounds, though he recently weighed 133 pounds at his assisted living facility. He attributes some of this discrepancy to potential differences in scales. No significant appetite issues are reported, and he hopes  for improvement post-chemotherapy.  PSA levels have shown a slight improvement, decreasing from 96 to 90 after one cycle of chemotherapy.       Review of Systems  Constitutional:  Positive for malaise/fatigue and weight loss. Negative for chills, diaphoresis and  fever.  HENT:  Negative for nosebleeds and sore throat.   Eyes:  Negative for double vision.  Respiratory:  Negative for cough, hemoptysis, sputum production, shortness of breath and wheezing.   Cardiovascular:  Negative for chest pain, palpitations, orthopnea and leg swelling.  Gastrointestinal:  Negative for abdominal pain, blood in stool, constipation, diarrhea, heartburn, melena, nausea and vomiting.  Genitourinary:  Negative for dysuria, frequency and urgency.  Musculoskeletal:  Negative for back pain and joint pain.  Skin: Negative.  Negative for itching and rash.  Neurological:  Positive for weakness. Negative for dizziness, tingling, focal weakness and headaches.  Endo/Heme/Allergies:  Does not bruise/bleed easily.  Psychiatric/Behavioral:  Negative for depression. The patient is not nervous/anxious and does not have insomnia.       PAST MEDICAL HISTORY :  Past Medical History:  Diagnosis Date   Acute kidney failure    Arthritis    Asthma    Cancer (HCC)    Dysphagia    Parkinson disease (HCC)    Prostate cancer (HCC)    Rhabdomyolysis     PAST SURGICAL HISTORY :   Past Surgical History:  Procedure Laterality Date   CARDIAC CATHETERIZATION     KNEE ARTHROSCOPY Right 2012   also lt knee unsure of date   TRANSURETHRAL RESECTION OF PROSTATE N/A 03/17/2022   Procedure: TRANSURETHRAL RESECTION OF THE PROSTATE (TURP);  Surgeon: Penne Knee, MD;  Location: [facility] ORS;  Service: Urology;  Laterality: N/A;    FAMILY HISTORY :   Family History  Problem Relation Age of Onset   Arthritis/Rheumatoid Mother    Stroke Father    Hemangiomas Brother     SOCIAL HISTORY:   Social History   Tobacco Use   Smoking status: Never   Smokeless tobacco: Never  Vaping Use   Vaping status: Never Used  Substance Use Topics   Alcohol use: Yes   Drug use: Never    ALLERGIES:  has no known allergies.  MEDICATIONS:  Current Outpatient Medications  Medication Sig Dispense Refill    acetaminophen  (TYLENOL ) 325 MG tablet Take 650 mg by mouth every 6 (six) hours as needed.     calcium -vitamin D  (OSCAL WITH D) 500-5 MG-MCG tablet TAKE 1 TABLET BY MOUTH TWICE A DAY. 60 tablet 10   carbidopa -levodopa  (SINEMET  IR) 25-100 MG tablet Take 1 tablet by mouth 3 (three) times daily. (Patient taking differently: 3 (three) times daily. 2 with breakfast, 1 with lunch and 2 with dinner) 90 tablet 3   dexamethasone  (DECADRON ) 4 MG tablet Take 2 tabs by mouth 2 times daily starting day before chemo. Then take 2 tabs daily for 2 days starting day after chemo. Take with food. 30 tablet 1   [name]  60 MG tablet TAKE 4 TABLETS BY MOUTH EVERY DAY 120 tablet 2   Leuprolide  Acetate (ELIGARD  [place]) Inject into the skin.     loperamide  (IMODIUM ) 2 MG capsule Take 1 tablet (2 mg) by mouth at first loose/watery stool then additional tablet with each subsequent loose stools. Do not exceed 8 tablets in 24 hour period. 120 capsule 3   polyethylene glycol powder (MIRALAX ) 17 GM/SCOOP powder Take 17 g by mouth every 8 (eight) hours as needed for mild constipation or moderate  constipation. Dissolve in 6-8 ounces of liquid. 238 g 3   ondansetron  (ZOFRAN ) 8 MG tablet Take 1 tablet (8 mg total) by mouth every 8 (eight) hours as needed for nausea or vomiting. (Patient not taking: Reported on 04/14/2024) 30 tablet 1   prochlorperazine  (COMPAZINE ) 10 MG tablet Take 1 tablet (10 mg total) by mouth every 6 (six) hours as needed for nausea or vomiting. (Patient not taking: Reported on 04/14/2024) 30 tablet 1   senna (SENOKOT) 8.6 MG TABS tablet Take 1-2 tablets (8.6-17.2 mg total) by mouth 3 (three) times daily as needed for mild constipation. Ok to take with miralax . (Patient not taking: Reported on 04/14/2024) 120 tablet 3   No current facility-administered medications for this visit.   Facility-Administered Medications Ordered in Other Visits  Medication Dose Route Frequency Provider Last Rate Last Admin   0.9 %  sodium  chloride infusion   Intravenous Continuous Nova Evett R, MD 10 mL/hr at 04/14/24 1020 New Bag at 04/14/24 1020   dexamethasone  (DECADRON ) injection 10 mg  10 mg Intravenous Once Taima Rada R, MD       DOCEtaxel  (TAXOTERE ) 103 mg in sodium chloride  0.9 % 250 mL chemo infusion  60 mg/m2 (Treatment Plan Recorded) Intravenous Once Clevie Prout R, MD        PHYSICAL EXAMINATION: ECOG PERFORMANCE STATUS: 2 - Symptomatic, <50% confined to bed  BP 126/66 (BP Location: Right Arm, Patient Position: Sitting, Cuff Size: Normal)   Pulse 71   Temp (!) 95.9 F (35.5 C) (Oral)   Resp 16   Ht 5' 8 (1.727 m)   Wt 129 lb 12.8 oz (58.9 kg)   SpO2 100%   BMI 19.74 kg/m   Filed Weights   04/14/24 0911  Weight: 129 lb 12.8 oz (58.9 kg)     Physical Exam Vitals and nursing note reviewed.  HENT:     Head: Normocephalic and atraumatic.     Mouth/Throat:     Pharynx: Oropharynx is clear.  Eyes:     Extraocular Movements: Extraocular movements intact.     Pupils: Pupils are equal, round, and reactive to light.  Cardiovascular:     Rate and Rhythm: Normal rate and regular rhythm.  Pulmonary:     Comments: Decreased breath sounds bilaterally.  Abdominal:     Palpations: Abdomen is soft.  Musculoskeletal:        General: Normal range of motion.     Cervical back: Normal range of motion.  Skin:    General: Skin is warm.  Neurological:     General: No focal deficit present.     Mental Status: He is alert and oriented to person, place, and time.  Psychiatric:        Behavior: Behavior normal.        Judgment: Judgment normal.     LABORATORY DATA:  I have reviewed the data as listed    Component Value Date/Time   NA 137 04/14/2024 0903   NA 140 07/31/2021 1612   K 3.9 04/14/2024 0903   CL 102 04/14/2024 0903   CO2 27 04/14/2024 0903   GLUCOSE 100 (H) 04/14/2024 0903   BUN 23 04/14/2024 0903   BUN 25 07/31/2021 1612   CREATININE 1.02 04/14/2024 0903   CALCIUM   9.3 04/14/2024 0903   PROT 6.9 04/14/2024 0903   PROT 6.5 07/31/2021 1612   ALBUMIN 4.0 04/14/2024 0903   ALBUMIN 4.3 07/31/2021 1612   AST 14 (L) 04/14/2024 0903   ALT 9  04/14/2024 0903   ALKPHOS 92 04/14/2024 0903   BILITOT 0.8 04/14/2024 0903   GFRNONAA >60 04/14/2024 0903    No results found for: SPEP, UPEP  Lab Results  Component Value Date   WBC 7.2 04/14/2024   NEUTROABS 5.2 04/14/2024   HGB 11.4 (L) 04/14/2024   HCT 33.7 (L) 04/14/2024   MCV 89.9 04/14/2024   PLT 317 04/14/2024      Chemistry      Component Value Date/Time   NA 137 04/14/2024 0903   NA 140 07/31/2021 1612   K 3.9 04/14/2024 0903   CL 102 04/14/2024 0903   CO2 27 04/14/2024 0903   BUN 23 04/14/2024 0903   BUN 25 07/31/2021 1612   CREATININE 1.02 04/14/2024 0903      Component Value Date/Time   CALCIUM  9.3 04/14/2024 0903   ALKPHOS 92 04/14/2024 0903   AST 14 (L) 04/14/2024 0903   ALT 9 04/14/2024 0903   BILITOT 0.8 04/14/2024 0903      Latest Reference Range & Units 10/23/21 10:12 12/04/21 10:29 02/05/22 14:38 03/31/22 11:28 04/24/22 13:19 05/19/22 08:57 06/20/22 09:57 08/21/22 08:29 10/21/22 13:06 12/25/22 10:36 02/02/23 09:54 03/05/23 09:30 04/09/23 09:37 05/11/23 09:52 06/22/23 14:11 08/20/23 09:48  Prostatic Specific Antigen 0.00 - 4.00 ng/mL 16.55 (H) 8.25 (H) 15.66 (H) 35.24 (H) 65.39 (H) 12.85 (H) 3.90 1.69 3.07 15.67 (H) 31.46 (H) 62.19 (H) 20.94 (H) 11.94 (H) 9.45 (H) 18.59 (H)  (H): Data is abnormally high  RADIOGRAPHIC STUDIES: I have personally reviewed the radiological images as listed and agreed with the findings in the report. No results found.   ASSESSMENT & PLAN:  Prostate cancer metastatic to bone (HCC) # -De novo metastatic-  prostate cancer [Dx- DEC 2022;[place]] -# RISING PSA-  MAY 2025- PET scan- Interval improvement in tracer avid tumor involving the sacrum and posterior right iliac bone; New/progressive foci of tracer avid bone metastasis are identified involving the  left second rib, right iliac bone and right acetabulum;  New tracer avid retroperitoneal and right iliac chain nodal metastasis;  New focus of increased uptake within segment 4 of the liver without corresponding CT abnormality, equivocal for liver metastasis.    # Proceed with cycle # 3 of planned 6 cycles of Taxotere  chemotherapy. Patient will get growth factor on day 2. Continue dose reduce to 60 mg/m. Will space out to 4 weeks- given worsening fatigue.  Discussed regarding use of Pluvicto-if patient has progressive disease or poor tolerance of ongoing chemotherapy.  Discussed that further treatments will give him approximately a year or more longevity.  # bone metastasis: Hold Zometa given-unclear dental clearance.  Will check at next visit  # Urinary obstruction -s/p TURP [OCT 2023; Dr.Brandon]- stable.   # Dysphagia- [> 30 years-] ?  Esophageal spasms status post barium swallow. stable Continue follow-up with GI.  If issues with swallowing pills-recommend disintegrating Zofran .   #Parkinson's-[Dr.Potter]-  on sinemet .stable  # ACP: s/p GOC- DNR.  # Vaccinations- OK with flu shot/Pneumonia vaccinations   # IV access: PIV   Eigard 45mg - JULY 18th 2025;  [facility] *transport-   # starting cycle #3 -q 4w # DISPOSITION: # STOP erleda- # Chemo today; d-2 injefction # as per IS-  # in 4 weeks- labs- cbc/cmp; PSA-  MD:  D-2 injection- Dr.B      Orders Placed This Encounter  Procedures   CBC with Differential (Cancer Center Only)    Standing Status:   Future    Expected  Date:   05/12/2024    Expiration Date:   05/12/2025   CMP (Cancer Center only)    Standing Status:   Future    Expected Date:   05/12/2024    Expiration Date:   05/12/2025   PSA    Standing Status:   Future    Expected Date:   05/12/2024    Expiration Date:   04/14/2025   All questions were answered. The patient knows to call the clinic with any problems, questions or concerns.      Cindy JONELLE Joe, MD 04/14/2024 10:25 AM

## 2024-04-14 NOTE — Assessment & Plan Note (Addendum)
# -  De novo metastatic-  prostate cancer [Dx- DEC 2022;[place]] -# RISING PSA-  MAY 2025- PET scan- Interval improvement in tracer avid tumor involving the sacrum and posterior right iliac bone; New/progressive foci of tracer avid bone metastasis are identified involving the left second rib, right iliac bone and right acetabulum;  New tracer avid retroperitoneal and right iliac chain nodal metastasis;  New focus of increased uptake within segment 4 of the liver without corresponding CT abnormality, equivocal for liver metastasis.    # Proceed with cycle # 3 of planned 6 cycles of Taxotere  chemotherapy. Patient will get growth factor on day 2. Continue dose reduce to 60 mg/m. Will space out to 4 weeks- given worsening fatigue.  Discussed regarding use of Pluvicto-if patient has progressive disease or poor tolerance of ongoing chemotherapy.  Discussed that further treatments will give him approximately a year or more longevity.  # bone metastasis: Hold Zometa given-unclear dental clearance.  Will check at next visit  # Urinary obstruction -s/p TURP [OCT 2023; Dr.Brandon]- stable.   # Dysphagia- [> 30 years-] ?  Esophageal spasms status post barium swallow. stable Continue follow-up with GI.  If issues with swallowing pills-recommend disintegrating Zofran .   #Parkinson's-[Dr.Potter]-  on sinemet .stable  # ACP: s/p GOC- DNR.  # Vaccinations- OK with flu shot/Pneumonia vaccinations   # IV access: PIV   Eigard 45mg - JULY 18th 2025;  [facility] *transport-   # starting cycle #3 -q 4w # DISPOSITION: # STOP erleda- # Chemo today; d-2 injefction # as per IS-  # in 4 weeks- labs- cbc/cmp; PSA-  MD:  D-2 injection- Dr.B

## 2024-04-14 NOTE — Patient Instructions (Signed)
 [facility] - A DEPT OF . [facility]  Discharge Instructions: Thank you for choosing [facility] to provide your oncology and hematology care.  If you have a lab appointment with the Cancer Center, please go directly to the Cancer Center and check in at the registration area.  Wear comfortable clothing and clothing appropriate for easy access to any Portacath or PICC line.   We strive to give you quality time with your provider. You may need to reschedule your appointment if you arrive late (15 or more minutes).  Arriving late affects you and other patients whose appointments are after yours.  Also, if you miss three or more appointments without notifying the office, you may be dismissed from the clinic at the provider's discretion.      For prescription refill requests, have your pharmacy contact our office and allow 72 hours for refills to be completed.    Today you received the following chemotherapy and/or immunotherapy agents: Docetaxel       To help prevent nausea and vomiting after your treatment, we encourage you to take your nausea medication as directed.  BELOW ARE SYMPTOMS THAT SHOULD BE REPORTED IMMEDIATELY: *FEVER GREATER THAN 100.4 F (38 C) OR HIGHER *CHILLS OR SWEATING *NAUSEA AND VOMITING THAT IS NOT CONTROLLED WITH YOUR NAUSEA MEDICATION *UNUSUAL SHORTNESS OF BREATH *UNUSUAL BRUISING OR BLEEDING *URINARY PROBLEMS (pain or burning when urinating, or frequent urination) *BOWEL PROBLEMS (unusual diarrhea, constipation, pain near the anus) TENDERNESS IN MOUTH AND THROAT WITH OR WITHOUT PRESENCE OF ULCERS (sore throat, sores in mouth, or a toothache) UNUSUAL RASH, SWELLING OR PAIN  UNUSUAL VAGINAL DISCHARGE OR ITCHING   Items with * indicate a potential emergency and should be followed up as soon as possible or go to the Emergency Department if any problems should occur.  Please show the CHEMOTHERAPY ALERT CARD or IMMUNOTHERAPY  ALERT CARD at check-in to the Emergency Department and triage nurse.  Should you have questions after your visit or need to cancel or reschedule your appointment, please contact [facility] - A DEPT OF [facility]  [phone] and follow the prompts.  Office hours are 8:00 a.m. to 4:30 p.m. Monday - Friday. Please note that voicemails left after 4:00 p.m. may not be returned until the following business day.  We are closed weekends and major holidays. You have access to a nurse at all times for urgent questions. Please call the main number to the clinic [phone] and follow the prompts.  For any non-urgent questions, you may also contact your provider using MyChart. We now offer e-Visits for anyone 75 and older to request care online for non-urgent symptoms. For details visit [email or web address].   Also download the MyChart app! Go to the app store, search MyChart, open the app, select [facility], and log in with your MyChart username and password.

## 2024-04-14 NOTE — Telephone Encounter (Signed)
 [place] notified of below

## 2024-04-15 ENCOUNTER — Other Ambulatory Visit: Payer: Self-pay

## 2024-04-15 ENCOUNTER — Inpatient Hospital Stay

## 2024-04-15 VITALS — BP 124/74

## 2024-04-15 DIAGNOSIS — Z5111 Encounter for antineoplastic chemotherapy: Secondary | ICD-10-CM | POA: Diagnosis not present

## 2024-04-15 DIAGNOSIS — C61 Malignant neoplasm of prostate: Secondary | ICD-10-CM

## 2024-04-15 MED ORDER — PEGFILGRASTIM-FPGK 6 MG/0.6ML ~~LOC~~ SOSY
6.0000 mg | PREFILLED_SYRINGE | Freq: Once | SUBCUTANEOUS | Status: AC
  Administered 2024-04-15: 6 mg via SUBCUTANEOUS
  Filled 2024-04-15: qty 0.6

## 2024-04-15 MED ORDER — ZOLEDRONIC ACID 4 MG/5ML IV CONC
3.3000 mg | Freq: Once | INTRAVENOUS | Status: DC
  Filled 2024-04-15: qty 4.13

## 2024-05-12 ENCOUNTER — Inpatient Hospital Stay: Admitting: Internal Medicine

## 2024-05-12 ENCOUNTER — Inpatient Hospital Stay

## 2024-05-12 ENCOUNTER — Encounter: Payer: Self-pay | Admitting: Internal Medicine

## 2024-05-12 ENCOUNTER — Inpatient Hospital Stay: Attending: Internal Medicine

## 2024-05-12 VITALS — BP 118/67 | HR 59 | Resp 16

## 2024-05-12 VITALS — BP 118/63 | HR 64 | Temp 97.9°F | Resp 16 | Ht 68.0 in | Wt 131.8 lb

## 2024-05-12 DIAGNOSIS — J45909 Unspecified asthma, uncomplicated: Secondary | ICD-10-CM | POA: Diagnosis not present

## 2024-05-12 DIAGNOSIS — R1310 Dysphagia, unspecified: Secondary | ICD-10-CM | POA: Diagnosis not present

## 2024-05-12 DIAGNOSIS — C61 Malignant neoplasm of prostate: Secondary | ICD-10-CM

## 2024-05-12 DIAGNOSIS — K224 Dyskinesia of esophagus: Secondary | ICD-10-CM | POA: Diagnosis not present

## 2024-05-12 DIAGNOSIS — N139 Obstructive and reflux uropathy, unspecified: Secondary | ICD-10-CM | POA: Diagnosis not present

## 2024-05-12 DIAGNOSIS — Z5111 Encounter for antineoplastic chemotherapy: Secondary | ICD-10-CM | POA: Insufficient documentation

## 2024-05-12 DIAGNOSIS — C7951 Secondary malignant neoplasm of bone: Secondary | ICD-10-CM | POA: Diagnosis not present

## 2024-05-12 DIAGNOSIS — M1990 Unspecified osteoarthritis, unspecified site: Secondary | ICD-10-CM | POA: Diagnosis not present

## 2024-05-12 DIAGNOSIS — Z9079 Acquired absence of other genital organ(s): Secondary | ICD-10-CM | POA: Insufficient documentation

## 2024-05-12 DIAGNOSIS — Z7952 Long term (current) use of systemic steroids: Secondary | ICD-10-CM | POA: Insufficient documentation

## 2024-05-12 DIAGNOSIS — G20A1 Parkinson's disease without dyskinesia, without mention of fluctuations: Secondary | ICD-10-CM | POA: Diagnosis not present

## 2024-05-12 DIAGNOSIS — Z5189 Encounter for other specified aftercare: Secondary | ICD-10-CM | POA: Insufficient documentation

## 2024-05-12 DIAGNOSIS — Z8673 Personal history of transient ischemic attack (TIA), and cerebral infarction without residual deficits: Secondary | ICD-10-CM | POA: Insufficient documentation

## 2024-05-12 DIAGNOSIS — Z192 Hormone resistant malignancy status: Secondary | ICD-10-CM | POA: Diagnosis not present

## 2024-05-12 LAB — CMP (CANCER CENTER ONLY)
ALT: <5 U/L (ref 0–44)
AST: 15 U/L (ref 15–41)
Albumin: 4.1 g/dL (ref 3.5–5.0)
Alkaline Phosphatase: 87 U/L (ref 38–126)
Anion gap: 9 (ref 5–15)
BUN: 23 mg/dL (ref 8–23)
CO2: 28 mmol/L (ref 22–32)
Calcium: 9.8 mg/dL (ref 8.9–10.3)
Chloride: 104 mmol/L (ref 98–111)
Creatinine: 0.97 mg/dL (ref 0.61–1.24)
GFR, Estimated: >60 mL/min (ref >60)
Glucose, Bld: 100 mg/dL — ABNORMAL HIGH (ref 70–99)
Potassium: 4.1 mmol/L (ref 3.5–5.1)
Sodium: 142 mmol/L (ref 135–145)
Total Bilirubin: 0.3 mg/dL (ref 0.0–1.2)
Total Protein: 6.2 g/dL — ABNORMAL LOW (ref 6.5–8.1)

## 2024-05-12 LAB — CBC WITH DIFFERENTIAL (CANCER CENTER ONLY)
Abs Immature Granulocytes: 0.01 K/uL (ref 0.00–0.07)
Basophils Absolute: 0.1 K/uL (ref 0.0–0.1)
Basophils Relative: 1 %
Eosinophils Absolute: 0.2 K/uL (ref 0.0–0.5)
Eosinophils Relative: 4 %
HCT: 32.1 % — ABNORMAL LOW (ref 39.0–52.0)
Hemoglobin: 10.9 g/dL — ABNORMAL LOW (ref 13.0–17.0)
Immature Granulocytes: 0 %
Lymphocytes Relative: 16 %
Lymphs Abs: 0.9 K/uL (ref 0.7–4.0)
MCH: 31.1 pg (ref 26.0–34.0)
MCHC: 34 g/dL (ref 30.0–36.0)
MCV: 91.5 fL (ref 80.0–100.0)
Monocytes Absolute: 0.7 K/uL (ref 0.1–1.0)
Monocytes Relative: 14 %
Neutro Abs: 3.4 K/uL (ref 1.7–7.7)
Neutrophils Relative %: 65 %
Platelet Count: 254 K/uL (ref 150–400)
RBC: 3.51 MIL/uL — ABNORMAL LOW (ref 4.22–5.81)
RDW: 14.8 % (ref 11.5–15.5)
WBC Count: 5.3 K/uL (ref 4.0–10.5)
nRBC: 0 % (ref 0.0–0.2)

## 2024-05-12 LAB — PSA: Prostatic Specific Antigen: 110.89 ng/mL — ABNORMAL HIGH (ref 0.00–4.00)

## 2024-05-12 MED ORDER — SODIUM CHLORIDE 0.9 % IV SOLN
60.0000 mg/m2 | Freq: Once | INTRAVENOUS | Status: AC
  Administered 2024-05-12: 103 mg via INTRAVENOUS
  Filled 2024-05-12: qty 10.3

## 2024-05-12 MED ORDER — DEXAMETHASONE SOD PHOSPHATE PF 10 MG/ML IJ SOLN
10.0000 mg | Freq: Once | INTRAMUSCULAR | Status: AC
  Administered 2024-05-12: 10 mg via INTRAVENOUS

## 2024-05-12 MED ORDER — SODIUM CHLORIDE 0.9 % IV SOLN
INTRAVENOUS | Status: DC
  Filled 2024-05-12: qty 250

## 2024-05-12 NOTE — Progress Notes (Signed)
 [facility] OFFICE PROGRESS NOTE  Patient Care Team: Bertrum Charlie CROME, MD as PCP - General (Family Medicine) Rennie Cindy SAUNDERS, MD as Consulting Physician (Oncology) [name], MD as Consulting Physician (Radiation Oncology)   Cancer Staging  No matching staging information was found for the patient.    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 s/p Radiation [finished 25th, SEP 2024]-Continue Erleda  #  MAY 2025- PET scan- Interval improvement in tracer avid tumor involving the sacrum and posterior right iliac bone; New/progressive foci of tracer avid bone metastasis are identified involving the left second rib, right iliac bone and right acetabulum;  New tracer avid retroperitoneal and right iliac chain nodal metastasis;  New focus of increased uptake within segment 4 of the liver without corresponding CT abnormality, equivocal for liver metastasis.   # SEP 25th, 2025- start Taxotere  [60 mg/m every 3 weeks]   # n Christmas Day 2022 he fell and was no found for a couple of days and was found to be dehydrated and hypothermic.  Patient was admitted to the ICU in [place] .  During hospitalization patient was found to have elevated PSA -  962 and mri showed extensive sclerosis in pelvic, sacral, spine, and scapula, prostate cancer metastatic to bone. Creatinine was 2.94 without normalization. He had severe sepsis and bacteruria.   At discharge patient was sent over to rehab.  Given the family in [place]/brother-patient moved to [place].  Prior to this episode patient was losing weight.  However patient had been very healthy prior to this episode.  He was living alone.  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  Esophagus spasms.     Prostate cancer metastatic to bone (HCC)  08/14/2021 Initial Diagnosis   Prostate cancer metastatic to bone [facility])   Malignant neoplasm of prostate (HCC)  06/13/2021 Initial Diagnosis   Malignant neoplasm of prostate (HCC)   03/03/2024 -  Chemotherapy   Patient is on Treatment Plan : PROSTATE Docetaxel  (75) + Prednisone q21d        HISTORY OF PRESENT ILLNESS: Patient in a rolling walker.   Patient lives in assisted living/[facility]; accompanied by his brother.  Discussed the use of AI scribe software for clinical note transcription with the patient, who gave verbal consent to proceed.  History of Present Illness   Philip [name] is a 78 year old male with metastatic castrate-resistant prostate cancer who presents for follow-up and infusion.  He is currently undergoing chemotherapy for metastatic castrate-resistant prostate cancer. His chemotherapy schedule has been adjusted from every three weeks to every four weeks, resulting in a slight improvement in energy levels.  His appetite has improved but is not yet back to normal, although his weight remains steady. No issues with breathing. Recent blood work shows slight abnormalities, which are expected due to chemotherapy, and cancer markers are stable, ranging between 96 and 97.  He did not take his dexamethasone  this morning, as he forgot until reminded.      Review of Systems  Constitutional:  Positive for malaise/fatigue and weight loss.  Negative for chills, diaphoresis and fever.  HENT:  Negative for nosebleeds and sore throat.   Eyes:  Negative for double vision.  Respiratory:  Negative for cough, hemoptysis, sputum production, shortness of breath and wheezing.   Cardiovascular:  Negative for chest pain,  palpitations, orthopnea and leg swelling.  Gastrointestinal:  Negative for abdominal pain, blood in stool, constipation, diarrhea, heartburn, melena, nausea and vomiting.  Genitourinary:  Negative for dysuria, frequency and urgency.  Musculoskeletal:  Negative for back pain and joint pain.  Skin: Negative.  Negative for itching and rash.  Neurological:  Positive for weakness. Negative for dizziness, tingling, focal weakness and headaches.  Endo/Heme/Allergies:  Does not bruise/bleed easily.  Psychiatric/Behavioral:  Negative for depression. The patient is not nervous/anxious and does not have insomnia.       PAST MEDICAL HISTORY :  Past Medical History:  Diagnosis Date   Acute kidney failure    Arthritis    Asthma    Cancer (HCC)    Dysphagia    Parkinson disease (HCC)    Prostate cancer (HCC)    Rhabdomyolysis     PAST SURGICAL HISTORY :   Past Surgical History:  Procedure Laterality Date   CARDIAC CATHETERIZATION     KNEE ARTHROSCOPY Right 2012   also lt knee unsure of date   TRANSURETHRAL RESECTION OF PROSTATE N/A 03/17/2022   Procedure: TRANSURETHRAL RESECTION OF THE PROSTATE (TURP);  Surgeon: Penne Knee, MD;  Location: [facility] ORS;  Service: Urology;  Laterality: N/A;    FAMILY HISTORY :   Family History  Problem Relation Age of Onset   Arthritis/Rheumatoid Mother    Stroke Father    Hemangiomas Brother     SOCIAL HISTORY:   Social History   Tobacco Use   Smoking status: Never   Smokeless tobacco: Never  Vaping Use   Vaping status: Never Used  Substance Use Topics   Alcohol use: Yes   Drug use: Never    ALLERGIES:  has no known allergies.  MEDICATIONS:  Current Outpatient Medications  Medication Sig Dispense Refill   acetaminophen  (TYLENOL ) 325 MG tablet Take 650 mg by mouth every 6 (six) hours as needed.     calcium -vitamin D  (OSCAL WITH D) 500-5 MG-MCG tablet TAKE 1 TABLET BY MOUTH TWICE A DAY. 60 tablet 10   carbidopa -levodopa  (SINEMET  IR) 25-100  MG tablet Take 1 tablet by mouth 3 (three) times daily. (Patient taking differently: 3 (three) times daily. 2 with breakfast, 1 with lunch and 2 with dinner) 90 tablet 3   Leuprolide  Acetate (ELIGARD  [place]) Inject into the skin.     loperamide  (IMODIUM ) 2 MG capsule Take 1 tablet (2 mg) by mouth at first loose/watery stool then additional tablet with each subsequent loose stools. Do not exceed 8 tablets in 24 hour period. 120 capsule 3   polyethylene glycol powder (MIRALAX ) 17 GM/SCOOP powder Take 17 g by mouth every 8 (eight) hours as needed for mild constipation or moderate constipation. Dissolve in 6-8 ounces of liquid. 238 g 3   dexamethasone  (DECADRON ) 4 MG tablet Take 2 tabs by mouth 2 times daily starting day before chemo. Then take 2 tabs daily for 2 days starting day after chemo. Take with food. 30 tablet 1   ondansetron  (ZOFRAN ) 8 MG tablet Take 1 tablet (8 mg total) by mouth every 8 (eight) hours as needed for nausea or vomiting. (Patient not taking: Reported on 05/12/2024) 30 tablet 1   prochlorperazine  (COMPAZINE ) 10 MG tablet Take 1 tablet (10  mg total) by mouth every 6 (six) hours as needed for nausea or vomiting. (Patient not taking: Reported on 05/12/2024) 30 tablet 1   senna (SENOKOT) 8.6 MG TABS tablet Take 1-2 tablets (8.6-17.2 mg total) by mouth 3 (three) times daily as needed for mild constipation. Ok to take with miralax . (Patient not taking: Reported on 05/12/2024) 120 tablet 3   No current facility-administered medications for this visit.    PHYSICAL EXAMINATION: ECOG PERFORMANCE STATUS: 2 - Symptomatic, <50% confined to bed  BP 118/63 (BP Location: Right Arm, Patient Position: Sitting, Cuff Size: Normal)   Pulse 64   Temp 97.9 F (36.6 C) (Tympanic)   Resp 16   Ht 5' 8 (1.727 m)   Wt 131 lb 12.8 oz (59.8 kg)   SpO2 99%   BMI 20.04 kg/m   Filed Weights   05/12/24 1010  Weight: 131 lb 12.8 oz (59.8 kg)     Physical Exam Vitals and nursing note reviewed.  HENT:      Head: Normocephalic and atraumatic.     Mouth/Throat:     Pharynx: Oropharynx is clear.  Eyes:     Extraocular Movements: Extraocular movements intact.     Pupils: Pupils are equal, round, and reactive to light.  Cardiovascular:     Rate and Rhythm: Normal rate and regular rhythm.  Pulmonary:     Comments: Decreased breath sounds bilaterally.  Abdominal:     Palpations: Abdomen is soft.  Musculoskeletal:        General: Normal range of motion.     Cervical back: Normal range of motion.  Skin:    General: Skin is warm.  Neurological:     General: No focal deficit present.     Mental Status: He is alert and oriented to person, place, and time.  Psychiatric:        Behavior: Behavior normal.        Judgment: Judgment normal.     LABORATORY DATA:  I have reviewed the data as listed    Component Value Date/Time   NA 142 05/12/2024 1015   NA 140 07/31/2021 1612   K 4.1 05/12/2024 1015   CL 104 05/12/2024 1015   CO2 28 05/12/2024 1015   GLUCOSE 100 (H) 05/12/2024 1015   BUN 23 05/12/2024 1015   BUN 25 07/31/2021 1612   CREATININE 0.97 05/12/2024 1015   CALCIUM  9.8 05/12/2024 1015   PROT 6.2 (L) 05/12/2024 1015   PROT 6.5 07/31/2021 1612   ALBUMIN 4.1 05/12/2024 1015   ALBUMIN 4.3 07/31/2021 1612   AST 15 05/12/2024 1015   ALT <5 05/12/2024 1015   ALKPHOS 87 05/12/2024 1015   BILITOT 0.3 05/12/2024 1015   GFRNONAA >60 05/12/2024 1015    No results found for: SPEP, UPEP  Lab Results  Component Value Date   WBC 5.3 05/12/2024   NEUTROABS 3.4 05/12/2024   HGB 10.9 (L) 05/12/2024   HCT 32.1 (L) 05/12/2024   MCV 91.5 05/12/2024   PLT 254 05/12/2024      Chemistry      Component Value Date/Time   NA 142 05/12/2024 1015   NA 140 07/31/2021 1612   K 4.1 05/12/2024 1015   CL 104 05/12/2024 1015   CO2 28 05/12/2024 1015   BUN 23 05/12/2024 1015   BUN 25 07/31/2021 1612   CREATININE 0.97 05/12/2024 1015      Component Value Date/Time   CALCIUM  9.8  05/12/2024 1015   ALKPHOS 87 05/12/2024 1015  AST 15 05/12/2024 1015   ALT <5 05/12/2024 1015   BILITOT 0.3 05/12/2024 1015      Latest Reference Range & Units 10/23/21 10:12 12/04/21 10:29 02/05/22 14:38 03/31/22 11:28 04/24/22 13:19 05/19/22 08:57 06/20/22 09:57 08/21/22 08:29 10/21/22 13:06 12/25/22 10:36 02/02/23 09:54 03/05/23 09:30 04/09/23 09:37 05/11/23 09:52 06/22/23 14:11 08/20/23 09:48  Prostatic Specific Antigen 0.00 - 4.00 ng/mL 16.55 (H) 8.25 (H) 15.66 (H) 35.24 (H) 65.39 (H) 12.85 (H) 3.90 1.69 3.07 15.67 (H) 31.46 (H) 62.19 (H) 20.94 (H) 11.94 (H) 9.45 (H) 18.59 (H)  (H): Data is abnormally high  RADIOGRAPHIC STUDIES: I have personally reviewed the radiological images as listed and agreed with the findings in the report. No results found.   ASSESSMENT & PLAN:  Prostate cancer metastatic to bone (HCC) # -De novo metastatic-  prostate cancer [Dx- DEC 2022;[place]] -# RISING PSA-  MAY 2025- PET scan- Interval improvement in tracer avid tumor involving the sacrum and posterior right iliac bone; New/progressive foci of tracer avid bone metastasis are identified involving the left second rib, right iliac bone and right acetabulum;  New tracer avid retroperitoneal and right iliac chain nodal metastasis;  New focus of increased uptake within segment 4 of the liver without corresponding CT abnormality, equivocal for liver metastasis.    # Proceed with cycle # 4 of planned 6 cycles of Taxotere  chemotherapy. Patient will get growth factor on day 2. Continue dose reduce to 60 mg/m. Will space out to 4 weeks- given worsening fatigue.  Discussed regarding use of Pluvicto-if patient has progressive disease or poor tolerance of ongoing chemotherapy.    # bone metastasis: Hold Zometa  given-unclear dental clearance.  Will check at next visit  # Urinary obstruction -s/p TURP [OCT 2023; Dr.Brandon]- stable.   # Dysphagia- [> 30 years-] ?  Esophageal spasms status post barium swallow. stable  Continue follow-up with GI.  If issues with swallowing pills-recommend disintegrating Zofran .   #Parkinson's-[Dr.Potter]-  on sinemet .stable  # ACP: s/p GOC- DNR.  # Vaccinations- OK with flu shot/Pneumonia vaccinations   # IV access: PIV   Eigard 45mg - JULY 18th 2025;  [facility] *transport-   # starting cycle #3 -q 4w # DISPOSITION: # Chemo today; d-2 injefction # as per IS-  # in 4 weeks- labs- cbc/cmp; PSA-  MD:  D-2 injection- Dr.B      Orders Placed This Encounter  Procedures   CBC with Differential (Cancer Center Only)    Standing Status:   Future    Expected Date:   06/13/2024    Expiration Date:   06/13/2025   CMP (Cancer Center only)    Standing Status:   Future    Expected Date:   06/13/2024    Expiration Date:   06/13/2025   PSA    Standing Status:   Future    Expected Date:   06/12/2024    Expiration Date:   05/12/2025   All questions were answered. The patient knows to call the clinic with any problems, questions or concerns.      Cindy JONELLE Joe, MD 05/12/2024 8:48 PM

## 2024-05-12 NOTE — Assessment & Plan Note (Addendum)
# -  De novo metastatic-  prostate cancer [Dx- DEC 2022;[place]] -# RISING PSA-  MAY 2025- PET scan- Interval improvement in tracer avid tumor involving the sacrum and posterior right iliac bone; New/progressive foci of tracer avid bone metastasis are identified involving the left second rib, right iliac bone and right acetabulum;  New tracer avid retroperitoneal and right iliac chain nodal metastasis;  New focus of increased uptake within segment 4 of the liver without corresponding CT abnormality, equivocal for liver metastasis.    # Proceed with cycle # 4 of planned 6 cycles of Taxotere  chemotherapy. Patient will get growth factor on day 2. Continue dose reduce to 60 mg/m. Will space out to 4 weeks- given worsening fatigue.  Discussed regarding use of Pluvicto-if patient has progressive disease or poor tolerance of ongoing chemotherapy.    # bone metastasis: Hold Zometa  given-unclear dental clearance.  Will check at next visit  # Urinary obstruction -s/p TURP [OCT 2023; Dr.Brandon]- stable.   # Dysphagia- [> 30 years-] ?  Esophageal spasms status post barium swallow. stable Continue follow-up with GI.  If issues with swallowing pills-recommend disintegrating Zofran .   #Parkinson's-[Dr.Potter]-  on sinemet .stable  # ACP: s/p GOC- DNR.  # Vaccinations- OK with flu shot/Pneumonia vaccinations   # IV access: PIV   Eigard 45mg - JULY 18th 2025;  [facility] *transport-   # starting cycle #3 -q 4w # DISPOSITION: # Chemo today; d-2 injefction # as per IS-  # in 4 weeks- labs- cbc/cmp; PSA-  MD:  D-2 injection- Dr.B

## 2024-05-12 NOTE — Patient Instructions (Signed)
 [facility] - A DEPT OF . [facility]  Discharge Instructions: Thank you for choosing [facility] to provide your oncology and hematology care.  If you have a lab appointment with the Cancer Center, please go directly to the Cancer Center and check in at the registration area.  Wear comfortable clothing and clothing appropriate for easy access to any Portacath or PICC line.   We strive to give you quality time with your provider. You may need to reschedule your appointment if you arrive late (15 or more minutes).  Arriving late affects you and other patients whose appointments are after yours.  Also, if you miss three or more appointments without notifying the office, you may be dismissed from the clinic at the provider's discretion.      For prescription refill requests, have your pharmacy contact our office and allow 72 hours for refills to be completed.    Today you received the following chemotherapy and/or immunotherapy agents: Docetaxel       To help prevent nausea and vomiting after your treatment, we encourage you to take your nausea medication as directed.  BELOW ARE SYMPTOMS THAT SHOULD BE REPORTED IMMEDIATELY: *FEVER GREATER THAN 100.4 F (38 C) OR HIGHER *CHILLS OR SWEATING *NAUSEA AND VOMITING THAT IS NOT CONTROLLED WITH YOUR NAUSEA MEDICATION *UNUSUAL SHORTNESS OF BREATH *UNUSUAL BRUISING OR BLEEDING *URINARY PROBLEMS (pain or burning when urinating, or frequent urination) *BOWEL PROBLEMS (unusual diarrhea, constipation, pain near the anus) TENDERNESS IN MOUTH AND THROAT WITH OR WITHOUT PRESENCE OF ULCERS (sore throat, sores in mouth, or a toothache) UNUSUAL RASH, SWELLING OR PAIN  UNUSUAL VAGINAL DISCHARGE OR ITCHING   Items with * indicate a potential emergency and should be followed up as soon as possible or go to the Emergency Department if any problems should occur.  Please show the CHEMOTHERAPY ALERT CARD or IMMUNOTHERAPY  ALERT CARD at check-in to the Emergency Department and triage nurse.  Should you have questions after your visit or need to cancel or reschedule your appointment, please contact [facility] - A DEPT OF [facility]  [phone] and follow the prompts.  Office hours are 8:00 a.m. to 4:30 p.m. Monday - Friday. Please note that voicemails left after 4:00 p.m. may not be returned until the following business day.  We are closed weekends and major holidays. You have access to a nurse at all times for urgent questions. Please call the main number to the clinic [phone] and follow the prompts.  For any non-urgent questions, you may also contact your provider using MyChart. We now offer e-Visits for anyone 75 and older to request care online for non-urgent symptoms. For details visit [email or web address].   Also download the MyChart app! Go to the app store, search MyChart, open the app, select [facility], and log in with your MyChart username and password.

## 2024-05-12 NOTE — Progress Notes (Signed)
 He states he may have missed his dexamethasone  today.

## 2024-05-13 ENCOUNTER — Inpatient Hospital Stay

## 2024-05-13 ENCOUNTER — Other Ambulatory Visit: Payer: Self-pay

## 2024-05-13 DIAGNOSIS — Z5111 Encounter for antineoplastic chemotherapy: Secondary | ICD-10-CM | POA: Diagnosis not present

## 2024-05-13 DIAGNOSIS — C61 Malignant neoplasm of prostate: Secondary | ICD-10-CM

## 2024-05-13 MED ORDER — PEGFILGRASTIM-FPGK 6 MG/0.6ML ~~LOC~~ SOSY
6.0000 mg | PREFILLED_SYRINGE | Freq: Once | SUBCUTANEOUS | Status: AC
  Administered 2024-05-13: 6 mg via SUBCUTANEOUS
  Filled 2024-05-13: qty 0.6

## 2024-06-06 ENCOUNTER — Telehealth: Payer: Self-pay | Admitting: *Deleted

## 2024-06-06 NOTE — Telephone Encounter (Signed)
 Patient's brother left triage voice mail that pt's [name]  keeps getting delivered at [facility] over the last 2-3 months. He stated that his brother is no longer on this med. He is asking that we contact the pharmacy to get the rx stopped being RF.

## 2024-06-06 NOTE — Telephone Encounter (Signed)
"  James notified.  "

## 2024-06-06 NOTE — Telephone Encounter (Signed)
 Spoke with Cathlean from [employer], notified her pt is no longer on this rx and needs to be d/c on their end with no more shipments.

## 2024-06-06 NOTE — Telephone Encounter (Signed)
 SABRA

## 2024-06-13 ENCOUNTER — Inpatient Hospital Stay

## 2024-06-13 ENCOUNTER — Inpatient Hospital Stay: Admitting: Internal Medicine

## 2024-06-13 ENCOUNTER — Encounter: Payer: Self-pay | Admitting: Internal Medicine

## 2024-06-13 ENCOUNTER — Inpatient Hospital Stay: Attending: Internal Medicine

## 2024-06-13 VITALS — BP 124/62 | HR 64 | Temp 97.0°F | Resp 16 | Ht 68.0 in | Wt 132.7 lb

## 2024-06-13 DIAGNOSIS — N139 Obstructive and reflux uropathy, unspecified: Secondary | ICD-10-CM | POA: Diagnosis not present

## 2024-06-13 DIAGNOSIS — C61 Malignant neoplasm of prostate: Secondary | ICD-10-CM

## 2024-06-13 DIAGNOSIS — R1310 Dysphagia, unspecified: Secondary | ICD-10-CM | POA: Insufficient documentation

## 2024-06-13 DIAGNOSIS — Z191 Hormone sensitive malignancy status: Secondary | ICD-10-CM | POA: Diagnosis not present

## 2024-06-13 DIAGNOSIS — J45909 Unspecified asthma, uncomplicated: Secondary | ICD-10-CM | POA: Diagnosis not present

## 2024-06-13 DIAGNOSIS — Z9079 Acquired absence of other genital organ(s): Secondary | ICD-10-CM | POA: Diagnosis not present

## 2024-06-13 DIAGNOSIS — C7951 Secondary malignant neoplasm of bone: Secondary | ICD-10-CM | POA: Diagnosis not present

## 2024-06-13 DIAGNOSIS — M1990 Unspecified osteoarthritis, unspecified site: Secondary | ICD-10-CM | POA: Insufficient documentation

## 2024-06-13 DIAGNOSIS — G20A1 Parkinson's disease without dyskinesia, without mention of fluctuations: Secondary | ICD-10-CM | POA: Insufficient documentation

## 2024-06-13 DIAGNOSIS — Z9221 Personal history of antineoplastic chemotherapy: Secondary | ICD-10-CM | POA: Diagnosis not present

## 2024-06-13 LAB — CMP (CANCER CENTER ONLY)
ALT: <5 U/L (ref 0–44)
AST: 15 U/L (ref 15–41)
Albumin: 4.3 g/dL (ref 3.5–5.0)
Alkaline Phosphatase: 104 U/L (ref 38–126)
Anion gap: 8 (ref 5–15)
BUN: 23 mg/dL (ref 8–23)
CO2: 28 mmol/L (ref 22–32)
Calcium: 9.7 mg/dL (ref 8.9–10.3)
Chloride: 102 mmol/L (ref 98–111)
Creatinine: 0.95 mg/dL (ref 0.61–1.24)
GFR, Estimated: >60 mL/min
Glucose, Bld: 105 mg/dL — ABNORMAL HIGH (ref 70–99)
Potassium: 4.1 mmol/L (ref 3.5–5.1)
Sodium: 138 mmol/L (ref 135–145)
Total Bilirubin: 0.5 mg/dL (ref 0.0–1.2)
Total Protein: 6.6 g/dL (ref 6.5–8.1)

## 2024-06-13 LAB — CBC WITH DIFFERENTIAL (CANCER CENTER ONLY)
Abs Immature Granulocytes: 0.01 K/uL (ref 0.00–0.07)
Basophils Absolute: 0.1 K/uL (ref 0.0–0.1)
Basophils Relative: 2 %
Eosinophils Absolute: 0.2 K/uL (ref 0.0–0.5)
Eosinophils Relative: 7 %
HCT: 35.2 % — ABNORMAL LOW (ref 39.0–52.0)
Hemoglobin: 11.8 g/dL — ABNORMAL LOW (ref 13.0–17.0)
Immature Granulocytes: 0 %
Lymphocytes Relative: 26 %
Lymphs Abs: 0.9 K/uL (ref 0.7–4.0)
MCH: 30.7 pg (ref 26.0–34.0)
MCHC: 33.5 g/dL (ref 30.0–36.0)
MCV: 91.7 fL (ref 80.0–100.0)
Monocytes Absolute: 0.5 K/uL (ref 0.1–1.0)
Monocytes Relative: 14 %
Neutro Abs: 1.8 K/uL (ref 1.7–7.7)
Neutrophils Relative %: 51 %
Platelet Count: 237 K/uL (ref 150–400)
RBC: 3.84 MIL/uL — ABNORMAL LOW (ref 4.22–5.81)
RDW: 14.2 % (ref 11.5–15.5)
WBC Count: 3.5 K/uL — ABNORMAL LOW (ref 4.0–10.5)
nRBC: 0 % (ref 0.0–0.2)

## 2024-06-13 LAB — PSA: Prostatic Specific Antigen: 135 ng/mL — ABNORMAL HIGH (ref 0.00–4.00)

## 2024-06-13 NOTE — Progress Notes (Signed)
"   No concerns today  "

## 2024-06-13 NOTE — Assessment & Plan Note (Addendum)
# -  De novo metastatic-  prostate cancer [Dx- DEC 2022;[place]] -# RISING PSA-  MAY 2025- PET scan- Interval improvement in tracer avid tumor involving the sacrum and posterior right iliac bone; New/progressive foci of tracer avid bone metastasis are identified involving the left second rib, right iliac bone and right acetabulum;  New tracer avid retroperitoneal and right iliac chain nodal metastasis;  New focus of increased uptake within segment 4 of the liver without corresponding CT abnormality, equivocal for liver metastasis.    # HOLD cycle # 5 of planned 6 cycles of Taxotere  chemotherapy. Discussed regarding use of Pluvicto-given patient has progressive disease- as per RISING PSA.   # Given the progression of disease discussed option of PSMA targeted radiation therapy would be indicated.  Patient tumor was evident on PSMA scan.  Discussed with patient that radio active treatment would be given once every 6 weeks for 4 cycles [2 more cycles could be given to patient responding to therapy].  Discussed the average survival-with treatment was about 15 months versus placebo 11 months.  Again discussed response rates were approximately 30%.  Patient understands that treatments are offered through nuclear medicine service in [place]. I have contacted Dr.Edmunds re: the patient.   # bone metastasis:s/p  dental clearance. Proceed with Zometa  next vsisit-   # Urinary obstruction -s/p TURP [OCT 2023; Dr.Brandon]- stable.   # Dysphagia- [> 30 years-] ?  Esophageal spasms status post barium swallow. stable Continue follow-up with GI.  If issues with swallowing pills-recommend disintegrating Zofran .   #Parkinson's-[Dr.Potter]-  on sinemet .stable  # ACP: s/p GOC- DNR.  # Vaccinations- OK with flu shot/Pneumonia vaccinations   # IV access: PIV   Eigard 45mg - JULY 18th 2025;  [facility]  *transport-   # DISPOSITION: # referral to [facility]/Dr.stewart re: Pluvicto  # HOLD Chemo today; cancel d-2  injefction # as per IS-  # in 4 weeks- labs- cbc/cmp; PSA-  MD; possible Zometa ; ELigard - Dr.B  # 40 minutes face-to-face with the patient discussing the above plan of care; more than 50% of time spent on prognosis/ natural history; counseling and coordination.

## 2024-06-13 NOTE — Progress Notes (Signed)
 [facility] OFFICE PROGRESS NOTE  Patient Care Team: Bertrum Charlie CROME, MD as PCP - General (Family Medicine) Rennie Cindy SAUNDERS, MD as Consulting Physician (Oncology) [name], MD as Consulting Physician (Radiation Oncology)   Cancer Staging  No matching staging information was found for the patient.    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 s/p Radiation [finished 25th, SEP 2024]-Continue Erleda  #  MAY 2025- PET scan- Interval improvement in tracer avid tumor involving the sacrum and posterior right iliac bone; New/progressive foci of tracer avid bone metastasis are identified involving the left second rib, right iliac bone and right acetabulum;  New tracer avid retroperitoneal and right iliac chain nodal metastasis;  New focus of increased uptake within segment 4 of the liver without corresponding CT abnormality, equivocal for liver metastasis.   # SEP 25th, 2025- start Taxotere  [60 mg/m every 3 weeks] x 4 cycles- [DEC-JAN 2026-progression noted] discontinued cycle #5 onwards  # Pluvicto  # n Christmas Day 2022 he fell and was no found for a couple of days and was found to be dehydrated and hypothermic.  Patient was admitted to the ICU in [place] .  During hospitalization patient was found to have elevated PSA -  962 and mri showed extensive sclerosis in pelvic, sacral, spine, and scapula, prostate cancer metastatic to bone. Creatinine was 2.94 without normalization. He had severe sepsis and bacteruria.   At discharge patient was sent over  to rehab.  Given the family in /brother-patient moved to [place] .  Prior to this episode patient was losing weight.  However patient had been very healthy prior to this episode.  He was living alone.  Patient has chronic difficulty swallowing since age of 84.?  Esophagus spasms.     Prostate cancer metastatic to bone (HCC)  08/14/2021 Initial Diagnosis   Prostate cancer metastatic to bone [facility])   Malignant neoplasm of prostate (HCC)  06/13/2021 Initial Diagnosis   Malignant neoplasm of prostate (HCC)   03/03/2024 - 05/13/2024 Chemotherapy   Patient is on Treatment Plan : PROSTATE Docetaxel  (75) + Prednisone q21d        HISTORY OF PRESENT ILLNESS: Patient in a rolling walker.   Patient lives in assisted living/[facility]; accompanied by his brother.  Discussed the use of AI scribe software for clinical note transcription with the patient, who gave verbal consent to proceed.  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  He is currently receiving docetaxel , with today marking his fifth cycle. He reports overall good tolerance, noting improved recovery and well-being after each cycle, particularly during the 4-week interval between treatments. He denies significant chemotherapy-related adverse effects and remains asymptomatic from his malignancy, with no pain, abnormal bleeding, bruising, or new masses.  Despite ongoing chemotherapy, prostate cancer markers have shown a persistent upward trend, increasing from 77 in September to 110 most recently, with only a transient decrease. He states he felt his best prior to  initiation of chemotherapy. He is currently awaiting updated laboratory results.  He underwent dental extraction of three teeth over the past six months and has had recurrent issues  with a dental filling, requiring further dental evaluation. No extractions occurred at his most recent dental visit in November.      Review of Systems  Constitutional:  Positive for malaise/fatigue and weight loss. Negative for chills, diaphoresis and fever.  HENT:  Negative for nosebleeds and sore throat.   Eyes:  Negative for double vision.  Respiratory:  Negative for cough, hemoptysis, sputum production, shortness of breath and wheezing.   Cardiovascular:  Negative for chest pain, palpitations, orthopnea and leg swelling.  Gastrointestinal:  Negative for abdominal pain, blood in stool, constipation, diarrhea, heartburn, melena, nausea and vomiting.  Genitourinary:  Negative for dysuria, frequency and urgency.  Musculoskeletal:  Negative for back pain and joint pain.  Skin: Negative.  Negative for itching and rash.  Neurological:  Positive for weakness. Negative for dizziness, tingling, focal weakness and headaches.  Endo/Heme/Allergies:  Does not bruise/bleed easily.  Psychiatric/Behavioral:  Negative for depression. The patient is not nervous/anxious and does not have insomnia.       PAST MEDICAL HISTORY :  Past Medical History:  Diagnosis Date   Acute kidney failure    Arthritis    Asthma    Cancer (HCC)    Dysphagia    Parkinson disease (HCC)    Prostate cancer (HCC)    Rhabdomyolysis     PAST SURGICAL HISTORY :   Past Surgical History:  Procedure Laterality Date   CARDIAC CATHETERIZATION     KNEE ARTHROSCOPY Right 2012   also lt knee unsure of date   TRANSURETHRAL RESECTION OF PROSTATE N/A 03/17/2022   Procedure: TRANSURETHRAL RESECTION OF THE PROSTATE (TURP);  Surgeon: Penne Knee, MD;  Location: [facility] ORS;  Service: Urology;  Laterality: N/A;    FAMILY HISTORY :   Family History  Problem Relation Age of Onset   Arthritis/Rheumatoid Mother    Stroke Father    Hemangiomas Brother     SOCIAL HISTORY:   Social History   Tobacco Use   Smoking status: Never    Smokeless tobacco: Never  Vaping Use   Vaping status: Never Used  Substance Use Topics   Alcohol use: Yes   Drug use: Never    ALLERGIES:  has no known allergies.  MEDICATIONS:  Current Outpatient Medications  Medication Sig Dispense Refill   acetaminophen  (TYLENOL ) 325 MG tablet Take 650 mg by mouth every 6 (six) hours as needed.     calcium -vitamin D  (OSCAL WITH D) 500-5 MG-MCG tablet TAKE 1 TABLET BY MOUTH TWICE A DAY. 60 tablet 10   carbidopa -levodopa  (SINEMET  IR) 25-100 MG tablet Take 1 tablet by mouth 3 (three) times daily. (Patient taking differently: 3 (three) times daily. 2 with breakfast, 1 with lunch and 2 with dinner) 90 tablet 3   Leuprolide  Acetate (ELIGARD  Kline) Inject into the skin.     loperamide  (IMODIUM ) 2 MG capsule Take 1 tablet (2 mg) by mouth at first loose/watery stool then additional tablet with each subsequent loose stools. Do not exceed 8 tablets in 24 hour period. 120 capsule 3   polyethylene glycol powder (MIRALAX ) 17 GM/SCOOP powder Take 17 g by mouth every 8 (eight) hours as needed for mild constipation or moderate constipation. Dissolve in 6-8 ounces of liquid. 238 g 3   senna (SENOKOT) 8.6 MG TABS tablet Take 1-2 tablets (8.6-17.2 mg total) by mouth 3 (three)  times daily as needed for mild constipation. Ok to take with miralax . 120 tablet 3   No current facility-administered medications for this visit.    PHYSICAL EXAMINATION: ECOG PERFORMANCE STATUS: 2 - Symptomatic, <50% confined to bed  BP 124/62 (BP Location: Left Arm, Patient Position: Sitting, Cuff Size: Normal)   Pulse 64   Temp (!) 97 F (36.1 C) (Tympanic)   Resp 16   Ht 5' 8 (1.727 m)   Wt 132 lb 11.2 oz (60.2 kg)   SpO2 99%   BMI 20.18 kg/m   Filed Weights   06/13/24 0849  Weight: 132 lb 11.2 oz (60.2 kg)     Physical Exam Vitals and nursing note reviewed.  HENT:     Head: Normocephalic and atraumatic.     Mouth/Throat:     Pharynx: Oropharynx is clear.  Eyes:      Extraocular Movements: Extraocular movements intact.     Pupils: Pupils are equal, round, and reactive to light.  Cardiovascular:     Rate and Rhythm: Normal rate and regular rhythm.  Pulmonary:     Comments: Decreased breath sounds bilaterally.  Abdominal:     Palpations: Abdomen is soft.  Musculoskeletal:        General: Normal range of motion.     Cervical back: Normal range of motion.  Skin:    General: Skin is warm.  Neurological:     General: No focal deficit present.     Mental Status: He is alert and oriented to person, place, and time.  Psychiatric:        Behavior: Behavior normal.        Judgment: Judgment normal.     LABORATORY DATA:  I have reviewed the data as listed    Component Value Date/Time   NA 138 06/13/2024 0843   NA 140 07/31/2021 1612   K 4.1 06/13/2024 0843   CL 102 06/13/2024 0843   CO2 28 06/13/2024 0843   GLUCOSE 105 (H) 06/13/2024 0843   BUN 23 06/13/2024 0843   BUN 25 07/31/2021 1612   CREATININE 0.95 06/13/2024 0843   CALCIUM  9.7 06/13/2024 0843   PROT 6.6 06/13/2024 0843   PROT 6.5 07/31/2021 1612   ALBUMIN 4.3 06/13/2024 0843   ALBUMIN 4.3 07/31/2021 1612   AST 15 06/13/2024 0843   ALT <5 06/13/2024 0843   ALKPHOS 104 06/13/2024 0843   BILITOT 0.5 06/13/2024 0843   GFRNONAA >60 06/13/2024 0843    No results found for: SPEP, UPEP  Lab Results  Component Value Date   WBC 3.5 (L) 06/13/2024   NEUTROABS 1.8 06/13/2024   HGB 11.8 (L) 06/13/2024   HCT 35.2 (L) 06/13/2024   MCV 91.7 06/13/2024   PLT 237 06/13/2024      Chemistry      Component Value Date/Time   NA 138 06/13/2024 0843   NA 140 07/31/2021 1612   K 4.1 06/13/2024 0843   CL 102 06/13/2024 0843   CO2 28 06/13/2024 0843   BUN 23 06/13/2024 0843   BUN 25 07/31/2021 1612   CREATININE 0.95 06/13/2024 0843      Component Value Date/Time   CALCIUM  9.7 06/13/2024 0843   ALKPHOS 104 06/13/2024 0843   AST 15 06/13/2024 0843   ALT <5 06/13/2024 0843   BILITOT  0.5 06/13/2024 0843      Latest Reference Range & Units 10/23/21 10:12 12/04/21 10:29 02/05/22 14:38 03/31/22 11:28 04/24/22 13:19 05/19/22 08:57 06/20/22 09:57 08/21/22 08:29 10/21/22 13:06 12/25/22  10:36 02/02/23 09:54 03/05/23 09:30 04/09/23 09:37 05/11/23 09:52 06/22/23 14:11 08/20/23 09:48  Prostatic Specific Antigen 0.00 - 4.00 ng/mL 16.55 (H) 8.25 (H) 15.66 (H) 35.24 (H) 65.39 (H) 12.85 (H) 3.90 1.69 3.07 15.67 (H) 31.46 (H) 62.19 (H) 20.94 (H) 11.94 (H) 9.45 (H) 18.59 (H)  (H): Data is abnormally high  RADIOGRAPHIC STUDIES: I have personally reviewed the radiological images as listed and agreed with the findings in the report. No results found.   ASSESSMENT & PLAN:  Prostate cancer metastatic to bone (HCC) # -De novo metastatic-  prostate cancer [Dx- DEC 2022;[place]] -# RISING PSA-  MAY 2025- PET scan- Interval improvement in tracer avid tumor involving the sacrum and posterior right iliac bone; New/progressive foci of tracer avid bone metastasis are identified involving the left second rib, right iliac bone and right acetabulum;  New tracer avid retroperitoneal and right iliac chain nodal metastasis;  New focus of increased uptake within segment 4 of the liver without corresponding CT abnormality, equivocal for liver metastasis.    # HOLD cycle # 5 of planned 6 cycles of Taxotere  chemotherapy. Discussed regarding use of Pluvicto-given patient has progressive disease- as per RISING PSA.   # Given the progression of disease discussed option of PSMA targeted radiation therapy would be indicated.  Patient tumor was evident on PSMA scan.  Discussed with patient that radio active treatment would be given once every 6 weeks for 4 cycles [2 more cycles could be given to patient responding to therapy].  Discussed the average survival-with treatment was about 15 months versus placebo 11 months.  Again discussed response rates were approximately 30%.  Patient understands that treatments are offered  through nuclear medicine service in [place]. I have contacted Dr.Edmunds re: the patient.   # bone metastasis:s/p  dental clearance. Proceed with Zometa  next vsisit-   # Urinary obstruction -s/p TURP [OCT 2023; Dr.Brandon]- stable.   # Dysphagia- [> 30 years-] ?  Esophageal spasms status post barium swallow. stable Continue follow-up with GI.  If issues with swallowing pills-recommend disintegrating Zofran .   #Parkinson's-[Dr.Potter]-  on sinemet .stable  # ACP: s/p GOC- DNR.  # Vaccinations- OK with flu shot/Pneumonia vaccinations   # IV access: PIV   Eigard 45mg - JULY 18th 2025;  [facility]  *transport-   # DISPOSITION: # referral to Nuc med/Dr.stewart re: Pluvicto  # HOLD Chemo today; cancel d-2 injefction # as per IS-  # in 4 weeks- labs- cbc/cmp; PSA-  MD; possible Zometa ; ELigard - Dr.B  # 40 minutes face-to-face with the patient discussing the above plan of care; more than 50% of time spent on prognosis/ natural history; counseling and coordination.       Orders Placed This Encounter  Procedures   NM PLUVICTO ADMINISTRATION    Standing Status:   Future    Expected Date:   06/20/2024    Expiration Date:   06/13/2025    Scheduling Instructions:     Referral to Dr.Stewart/nuc med-[facility]    Reason for Exam (SYMPTOM  OR DIAGNOSIS REQUIRED):   metastatic prostate cancer    If indicated for the ordered procedure, I authorize the administration of a radiopharmaceutical per Radiology protocol:   Yes    Preferred imaging location?:   [facility]   CBC with Differential (Cancer Center Only)    Standing Status:   Future    Expected Date:   07/11/2024    Expiration Date:   10/09/2024   CMP (Cancer Center only)    Standing  Status:   Future    Expected Date:   07/11/2024    Expiration Date:   10/09/2024   PSA    Standing Status:   Future    Expected Date:   07/11/2024    Expiration Date:   10/09/2024   All questions were answered. The patient knows to call the clinic  with any problems, questions or concerns.      Cindy JONELLE Joe, MD 06/13/2024 11:21 AM

## 2024-06-14 ENCOUNTER — Inpatient Hospital Stay

## 2024-06-14 ENCOUNTER — Telehealth: Payer: Self-pay | Admitting: Internal Medicine

## 2024-06-14 NOTE — Telephone Encounter (Signed)
 Pt brother calling and states he has questions about Dr. Annis that Dr. B sent his brother to see in [place]. Please advise. [phone]

## 2024-06-15 ENCOUNTER — Inpatient Hospital Stay

## 2024-06-16 ENCOUNTER — Encounter (HOSPITAL_COMMUNITY)

## 2024-06-20 ENCOUNTER — Ambulatory Visit

## 2024-06-21 ENCOUNTER — Other Ambulatory Visit (HOSPITAL_COMMUNITY)

## 2024-06-23 ENCOUNTER — Encounter (HOSPITAL_COMMUNITY)

## 2024-06-28 ENCOUNTER — Other Ambulatory Visit (HOSPITAL_COMMUNITY)

## 2024-06-28 ENCOUNTER — Encounter (HOSPITAL_COMMUNITY)

## 2024-07-05 ENCOUNTER — Other Ambulatory Visit: Payer: Self-pay | Admitting: Internal Medicine

## 2024-07-11 ENCOUNTER — Telehealth: Payer: Self-pay | Admitting: Internal Medicine

## 2024-07-11 NOTE — Telephone Encounter (Signed)
 Left voicemail for patient time change for labs to 10 am.

## 2024-07-12 ENCOUNTER — Inpatient Hospital Stay: Attending: Internal Medicine

## 2024-07-12 ENCOUNTER — Encounter: Payer: Self-pay | Admitting: Internal Medicine

## 2024-07-12 ENCOUNTER — Inpatient Hospital Stay: Attending: Internal Medicine | Admitting: Internal Medicine

## 2024-07-12 ENCOUNTER — Inpatient Hospital Stay

## 2024-07-12 VITALS — BP 115/62 | HR 72 | Temp 97.6°F | Resp 16 | Wt 134.7 lb

## 2024-07-12 DIAGNOSIS — C61 Malignant neoplasm of prostate: Secondary | ICD-10-CM | POA: Diagnosis not present

## 2024-07-12 DIAGNOSIS — C7951 Secondary malignant neoplasm of bone: Secondary | ICD-10-CM

## 2024-07-12 LAB — CBC WITH DIFFERENTIAL (CANCER CENTER ONLY)
Abs Immature Granulocytes: 0.01 10*3/uL (ref 0.00–0.07)
Basophils Absolute: 0.1 10*3/uL (ref 0.0–0.1)
Basophils Relative: 1 %
Eosinophils Absolute: 0.2 10*3/uL (ref 0.0–0.5)
Eosinophils Relative: 5 %
HCT: 37 % — ABNORMAL LOW (ref 39.0–52.0)
Hemoglobin: 12.3 g/dL — ABNORMAL LOW (ref 13.0–17.0)
Immature Granulocytes: 0 %
Lymphocytes Relative: 22 %
Lymphs Abs: 1 10*3/uL (ref 0.7–4.0)
MCH: 30.4 pg (ref 26.0–34.0)
MCHC: 33.2 g/dL (ref 30.0–36.0)
MCV: 91.6 fL (ref 80.0–100.0)
Monocytes Absolute: 0.4 10*3/uL (ref 0.1–1.0)
Monocytes Relative: 9 %
Neutro Abs: 3 10*3/uL (ref 1.7–7.7)
Neutrophils Relative %: 63 %
Platelet Count: 244 10*3/uL (ref 150–400)
RBC: 4.04 MIL/uL — ABNORMAL LOW (ref 4.22–5.81)
RDW: 13 % (ref 11.5–15.5)
WBC Count: 4.8 10*3/uL (ref 4.0–10.5)
nRBC: 0 % (ref 0.0–0.2)

## 2024-07-12 LAB — CMP (CANCER CENTER ONLY)
ALT: <5 U/L (ref 0–44)
AST: 19 U/L (ref 15–41)
Albumin: 4.5 g/dL (ref 3.5–5.0)
Alkaline Phosphatase: 137 U/L — ABNORMAL HIGH (ref 38–126)
Anion gap: 10 (ref 5–15)
BUN: 22 mg/dL (ref 8–23)
CO2: 29 mmol/L (ref 22–32)
Calcium: 10 mg/dL (ref 8.9–10.3)
Chloride: 100 mmol/L (ref 98–111)
Creatinine: 1.08 mg/dL (ref 0.61–1.24)
GFR, Estimated: >60 mL/min
Glucose, Bld: 107 mg/dL — ABNORMAL HIGH (ref 70–99)
Potassium: 4.4 mmol/L (ref 3.5–5.1)
Sodium: 139 mmol/L (ref 135–145)
Total Bilirubin: 0.4 mg/dL (ref 0.0–1.2)
Total Protein: 6.9 g/dL (ref 6.5–8.1)

## 2024-07-12 LAB — PSA: Prostatic Specific Antigen: 218 ng/mL — ABNORMAL HIGH (ref 0.00–4.00)

## 2024-07-12 MED ORDER — SODIUM CHLORIDE 0.9 % IV SOLN
Freq: Once | INTRAVENOUS | Status: AC
  Filled 2024-07-12: qty 250

## 2024-07-12 MED ORDER — ZOLEDRONIC ACID 4 MG/5ML IV CONC
3.3000 mg | Freq: Once | INTRAVENOUS | Status: AC
  Administered 2024-07-12: 3.3 mg via INTRAVENOUS
  Filled 2024-07-12: qty 4.13

## 2024-07-12 MED ORDER — LEUPROLIDE ACETATE (6 MONTH) 45 MG ~~LOC~~ KIT
45.0000 mg | PACK | Freq: Once | SUBCUTANEOUS | Status: AC
  Administered 2024-07-12: 45 mg via SUBCUTANEOUS
  Filled 2024-07-12: qty 45

## 2024-07-12 NOTE — Progress Notes (Signed)
 Pt in for follow up, reports some constipation but not using stool softeners.  Pt reports increased energy levels since stopping chemotherapy.

## 2024-07-12 NOTE — Assessment & Plan Note (Addendum)
# -  De novo metastatic-  prostate cancer [Dx- DEC 2022;[place]] -# RISING PSA-  MAY 2025- PET scan- Interval improvement in tracer avid tumor involving the sacrum and posterior right iliac bone; New/progressive foci of tracer avid bone metastasis are identified involving the left second rib, right iliac bone and right acetabulum;  New tracer avid retroperitoneal and right iliac chain nodal metastasis;  New focus of increased uptake within segment 4 of the liver without corresponding CT abnormality, equivocal for liver metastasis.  AUG 2024-TEMPUS-NEG targets. DISCONTINUED cycle # 5 of planned 6 cycles of Taxotere  chemotherapy. Discussed regarding use of Pluvicto-given patient has progressive disease- as per RISING PSA.   # Given the progression of disease discussed option of PSMA targeted radiation therapy would be indicated.  Patient declines therapy at this time-as he is feeling overall well.  Will continue monitoring for now.  And will refer to nuclear medicine once patient is ready to proceed with treatments.  # bone metastasis:s/p  dental clearance; Zometa  # 1- 2/2-25. Q 2 months.   # constipation: G-1 recommend laxative prn.   # Urinary obstruction -s/p TURP [OCT 2023; Dr.Brandon]- stable.   # Dysphagia- [> 30 years-] ?  Esophageal spasms status post barium swallow. stable Continue follow-up with GI.   stable   #Parkinson's-[Dr.Potter]-  on sinemet .stable  # ACP: s/p GOC- DNR.  # Vaccinations- OK with flu shot/Pneumonia vaccinations   # IV access: PIV   Eigard 45mg - 07/12/2024  [facility]  *transport-; zometa  q 39m  # DISPOSITION: # today- Zometa ; ELigard - # in 9  weeks- labs- cbc/cmp; PSA-  MD; possible Zometa - Dr.B

## 2024-08-09 ENCOUNTER — Encounter (HOSPITAL_COMMUNITY)

## 2024-09-13 ENCOUNTER — Inpatient Hospital Stay

## 2024-09-13 ENCOUNTER — Inpatient Hospital Stay: Admitting: Internal Medicine

## 2024-09-20 ENCOUNTER — Encounter (HOSPITAL_COMMUNITY)
